# Patient Record
Sex: Female | Born: 1955
Health system: Southern US, Community
[De-identification: ages and names within clinical notes are randomized; demographics above are authoritative.]

## PROBLEM LIST (undated history)

## (undated) DIAGNOSIS — F329 Major depressive disorder, single episode, unspecified: Secondary | ICD-10-CM

## (undated) DIAGNOSIS — B019 Varicella without complication: Secondary | ICD-10-CM

## (undated) DIAGNOSIS — J301 Allergic rhinitis due to pollen: Secondary | ICD-10-CM

## (undated) DIAGNOSIS — E7800 Pure hypercholesterolemia, unspecified: Secondary | ICD-10-CM

## (undated) DIAGNOSIS — F32A Depression, unspecified: Secondary | ICD-10-CM

## (undated) DIAGNOSIS — M1990 Unspecified osteoarthritis, unspecified site: Secondary | ICD-10-CM

## (undated) DIAGNOSIS — T7840XA Allergy, unspecified, initial encounter: Secondary | ICD-10-CM

## (undated) DIAGNOSIS — M810 Age-related osteoporosis without current pathological fracture: Secondary | ICD-10-CM

## (undated) DIAGNOSIS — E039 Hypothyroidism, unspecified: Secondary | ICD-10-CM

## (undated) DIAGNOSIS — E079 Disorder of thyroid, unspecified: Secondary | ICD-10-CM

## (undated) HISTORY — PX: JOINT REPLACEMENT: SHX530

## (undated) HISTORY — PX: EYE SURGERY: SHX253

## (undated) HISTORY — DX: Allergic rhinitis due to pollen: J30.1

## (undated) HISTORY — DX: Allergy, unspecified, initial encounter: T78.40XA

## (undated) HISTORY — DX: Depression, unspecified: F32.A

## (undated) HISTORY — DX: Unspecified osteoarthritis, unspecified site: M19.90

## (undated) HISTORY — DX: Major depressive disorder, single episode, unspecified: F32.9

## (undated) HISTORY — PX: TOTAL KNEE ARTHROPLASTY: SHX125

## (undated) HISTORY — PX: PARTIAL KNEE ARTHROPLASTY: SHX2174

## (undated) HISTORY — DX: Varicella without complication: B01.9

## (undated) HISTORY — PX: ABDOMINAL HYSTERECTOMY: SHX81

---
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
## 2016-04-10 ENCOUNTER — Ambulatory Visit
Admission: EM | Admit: 2016-04-10 | Discharge: 2016-04-10 | Disposition: A | Discharge status: Home / Self Care | Payer: BLUE CROSS/BLUE SHIELD | Attending: Family Medicine | Admitting: Family Medicine

## 2016-04-10 ENCOUNTER — Ambulatory Visit (INDEPENDENT_AMBULATORY_CARE_PROVIDER_SITE_OTHER): Payer: BLUE CROSS/BLUE SHIELD

## 2016-04-10 DIAGNOSIS — S52131A Displaced fracture of neck of right radius, initial encounter for closed fracture: Secondary | ICD-10-CM

## 2016-04-10 HISTORY — DX: Disorder of thyroid, unspecified: E07.9

## 2016-04-10 HISTORY — DX: Pure hypercholesterolemia, unspecified: E78.00

## 2016-04-10 NOTE — ED Provider Notes (Signed)
CSN: [id number]     Arrival date & time 04/10/16  1320 History   First MD Initiated Contact with Patient 04/10/16 1352     Chief Complaint  Patient presents with  . Arm Injury    Pt reports two falls onto right side over the past month. Pain from upper right arm all the way down. "burning" pain 5/10. Full ROM but some movements make it hurt worse   (Consider location/radiation/quality/duration/timing/severity/associated sxs/prior Treatment) HPI   This is a 60 year old female who presents right nondominant shoulder and elbow pain following 2 separate falls. States that her first fall was about 3 weeks ago when she slipped on a throw rug on a hardwood floor fell landing onto her right side. States that time she had pain in her shoulder and elbow area which extended into her forearm or wrist fingers and hip were uninvolved. After a period of 2 weeks she had another fall on wet pavement given landing then the same fashion as the first fall again injuring her shoulder and elbow. She denies any loss of conscious and did not hit her head. However she continues to have pain in her shoulder with abduction and over her lateral elbow which seems to radiate over the dorsum of her forearm. Noticed that any activity with her wrist in extension in a pushing and is very painful. Her elbow is more painful than the shoulder. She did not hurt her neck and demonstrates good comfortable range of motion with some discomfort at the extremes but she states that she has had neck pain for many years. She denies any numbness or tingling but has had some radiation of the pain into her middle ring and little fingers.  Past Medical History  Diagnosis Date  . Thyroid dysfunction   . High cholesterol    Past Surgical History  Procedure Laterality Date  . Knee surgery     History reviewed. No pertinent family history. Social History  Substance Use Topics  . Smoking status: Never Smoker   . Smokeless tobacco: None  .  Alcohol Use: No   OB History    No data available     Review of Systems  Constitutional: Positive for activity change. Negative for fever, chills and fatigue.  Musculoskeletal: Positive for joint swelling and arthralgias.  All other systems reviewed and are negative.   Allergies  Review of patient's allergies indicates no known allergies.  Home Medications   Prior to Admission medications   Medication Sig Start Date End Date Taking? Authorizing Provider  atorvastatin (LIPITOR) 20 MG tablet Take 20 mg by mouth daily.   Yes Historical Provider, MD  thyroid (ARMOUR) 30 MG tablet Take 30 mg by mouth daily before breakfast.   Yes Historical Provider, MD   Meds Ordered and Administered this Visit  Medications - No data to display  BP 164/82 mmHg  Pulse 56  Temp(Src) 98 F (36.7 C) (Oral)  Resp 18  Ht 5\' 5"  (1.651 m)  Wt 184 lb (83.462 kg)  BMI 30.62 kg/m2  SpO2 98% No data found.   Physical Exam  Constitutional: She is oriented to person, place, and time. She appears well-developed and well-nourished. No distress.  HENT:  Head: Normocephalic and atraumatic.  Eyes: Conjunctivae are normal. Pupils are equal, round, and reactive to light.  Neck: Normal range of motion. Neck supple.  Musculoskeletal: She exhibits edema and tenderness.  Examination of the neck shows good comfortable range of motion is full with discomfort at the extremes.  This is not unusual for her. Examination of the clavicle shows no deformity and no discomfort. As noted the right shoulder shows some tenderness subacromial slightly posterior of midline. She has a negative empty can test negative arm raise and lowering test and Neer test. Strength is intact to clinical testing. Examination of the right elbow shows some mild swelling lateral over the lateral condyle. She does have tenderness over the common extensor origin reproducing her symptoms. Resisted wrist extension also causes pain as does lifting with the  hand in pronation. This causes a pain over the dorsum forearm over the common extensor tendons.  Neurological: She is alert and oriented to person, place, and time.  Skin: Skin is warm and dry. She is not diaphoretic.  Psychiatric: She has a normal mood and affect. Her behavior is normal. Judgment and thought content normal.  Nursing note and vitals reviewed.   ED Course  Procedures (including critical care time)  Labs Review Labs Reviewed - No data to display  Imaging Review Dg Shoulder Right  04/10/2016  CLINICAL DATA:  [place] twice on right arm in the last month. EXAM: RIGHT SHOULDER - 2+ VIEW COMPARISON:  None. FINDINGS: Mild degenerative changes in the right AC and glenohumeral joints with joint space narrowing and spurring. No acute bony abnormality. Specifically, no fracture, subluxation, or dislocation. Soft tissues are intact. IMPRESSION: No acute bony abnormality. Electronically Signed   By: Rolm Baptise M.D.   On: 04/10/2016 15:15   Dg Elbow Complete Right  04/10/2016  CLINICAL DATA:  Multiple falls in the last month onto right arm. Right elbow pain. EXAM: RIGHT ELBOW - COMPLETE 3+ VIEW COMPARISON:  None. FINDINGS: There is a mildly impacted radial neck fracture. No subluxation or dislocation. Soft tissues are intact. IMPRESSION: Mildly impacted right radial neck fracture. Electronically Signed   By: Rolm Baptise M.D.   On: 04/10/2016 15:16     Visual Acuity Review  Right Eye Distance:   Left Eye Distance:   Bilateral Distance:    Right Eye Near:   Left Eye Near:    Bilateral Near:     Application of reverse sugar tong splint to right upper extremity. Sling was provided for patient    MDM   1. Fracture of radial neck, right, closed, initial encounter    New Prescriptions   No medications on file  Plan: 1. Test/x-ray results and diagnosis reviewed with patient 2. rx as per orders; risks, benefits, potential side effects reviewed with patient 3. Recommend supportive  treatment with Elevation to control pain and swelling. Was given information on care for the splint. Sling was provided to the patient along with a reverse sugar tong splint to keep on until seen by the orthopedist next week. Encouraged her to perform range of motion with her fingers as well as her shoulder. 4. F/u prn if symptoms worsen or don't improve     Lorin Picket, PA-C 04/10/16 1543

## 2016-04-10 NOTE — Discharge Instructions (Signed)
°Cast or Splint Care  ° ° °Casts and splints support injured limbs and keep bones from moving while they heal. It is important to care for your cast or splint at home.  °HOME CARE INSTRUCTIONS  °Keep the cast or splint uncovered during the drying period. It can take 24 to 48 hours to dry if it is made of plaster. A fiberglass cast will dry in less than 1 hour.  °Do not rest the cast on anything harder than a pillow for the first 24 hours.  °Do not put weight on your injured limb or apply pressure to the cast until your health care provider gives you permission.  °Keep the cast or splint dry. Wet casts or splints can lose their shape and may not support the limb as well. A wet cast that has lost its shape can also create harmful pressure on your skin when it dries. Also, wet skin can become infected.  °Cover the cast or splint with a plastic bag when bathing or when out in the rain or snow. If the cast is on the trunk of the body, take sponge baths until the cast is removed.  °If your cast does become wet, dry it with a towel or a blow dryer on the cool setting only. °Keep your cast or splint clean. Soiled casts may be wiped with a moistened cloth.  °Do not place any hard or soft foreign objects under your cast or splint, such as cotton, toilet paper, lotion, or powder.  °Do not try to scratch the skin under the cast with any object. The object could get stuck inside the cast. Also, scratching could lead to an infection. If itching is a problem, use a blow dryer on a cool setting to relieve discomfort.  °Do not trim or cut your cast or remove padding from inside of it.  °Exercise all joints next to the injury that are not immobilized by the cast or splint. For example, if you have a long leg cast, exercise the hip joint and toes. If you have an arm cast or splint, exercise the shoulder, elbow, thumb, and fingers.  °Elevate your injured arm or leg on 1 or 2 pillows for the first 1 to 3 days to decrease swelling and  pain. It is best if you can comfortably elevate your cast so it is higher than your heart. °SEEK MEDICAL CARE IF:  °Your cast or splint cracks.  °Your cast or splint is too tight or too loose.  °You have unbearable itching inside the cast.  °Your cast becomes wet or develops a soft spot or area.  °You have a bad smell coming from inside your cast.  °You get an object stuck under your cast.  °Your skin around the cast becomes red or raw.  °You have new pain or worsening pain after the cast has been applied. °SEEK IMMEDIATE MEDICAL CARE IF:  °You have fluid leaking through the cast.  °You are unable to move your fingers or toes.  °You have discolored (blue or white), cool, painful, or very swollen fingers or toes beyond the cast.  °You have tingling or numbness around the injured area.  °You have severe pain or pressure under the cast.  °You have any difficulty with your breathing or have shortness of breath.  °You have chest pain. °This information is not intended to replace advice given to you by your health care provider. Make sure you discuss any questions you have with your health care provider.  °  Document Released: 10/23/2000 Document Revised: 08/16/2013 Document Reviewed: 05/04/2013  °Elsevier Interactive Patient Education ©2016 Elsevier Inc[employer]  ° °

## 2016-04-14 DIAGNOSIS — S52123A Displaced fracture of head of unspecified radius, initial encounter for closed fracture: Secondary | ICD-10-CM | POA: Insufficient documentation

## 2016-04-14 HISTORY — DX: Displaced fracture of head of unspecified radius, initial encounter for closed fracture: S52.123A

## 2016-04-17 ENCOUNTER — Encounter: Payer: Self-pay | Admitting: Primary Care

## 2016-04-17 ENCOUNTER — Ambulatory Visit (INDEPENDENT_AMBULATORY_CARE_PROVIDER_SITE_OTHER): Payer: BLUE CROSS/BLUE SHIELD | Admitting: Primary Care

## 2016-04-17 VITALS — BP 128/80 | HR 76 | Temp 98.0°F | Ht 64.0 in | Wt 190.1 lb

## 2016-04-17 DIAGNOSIS — D509 Iron deficiency anemia, unspecified: Secondary | ICD-10-CM | POA: Diagnosis not present

## 2016-04-17 DIAGNOSIS — M1990 Unspecified osteoarthritis, unspecified site: Secondary | ICD-10-CM | POA: Diagnosis not present

## 2016-04-17 DIAGNOSIS — E785 Hyperlipidemia, unspecified: Secondary | ICD-10-CM | POA: Insufficient documentation

## 2016-04-17 DIAGNOSIS — E039 Hypothyroidism, unspecified: Secondary | ICD-10-CM | POA: Diagnosis not present

## 2016-04-17 LAB — CBC
HCT: 43.6 % (ref 36.0–46.0)
Hemoglobin: 14.7 g/dL (ref 12.0–15.0)
MCHC: 33.7 g/dL (ref 30.0–36.0)
MCV: 86 fl (ref 78.0–100.0)
Platelets: 313 10*3/uL (ref 150.0–400.0)
RBC: 5.07 Mil/uL (ref 3.87–5.11)
RDW: 13.7 % (ref 11.5–15.5)
WBC: 7.8 10*3/uL (ref 4.0–10.5)

## 2016-04-17 LAB — IBC PANEL
IRON: 111 ug/dL (ref 42–145)
SATURATION RATIOS: 28.9 % (ref 20.0–50.0)
TRANSFERRIN: 274 mg/dL (ref 212.0–360.0)

## 2016-04-17 LAB — TSH: TSH: 1.32 u[IU]/mL (ref 0.35–4.50)

## 2016-04-17 MED ORDER — SIMVASTATIN 40 MG PO TABS: ORAL_TABLET | ORAL | Status: DC

## 2016-04-17 NOTE — Assessment & Plan Note (Signed)
Managed on simvastatin 40 mg, takes one half tablet daily. We'll repeat lipids at upcoming physical this fall

## 2016-04-17 NOTE — Assessment & Plan Note (Signed)
Diagnosed in 2011. Currently managed on thyroid Armour 60 mg every morning. TSH pending as she has not had repeat TSH since dose increase over several months ago.

## 2016-04-17 NOTE — Assessment & Plan Note (Signed)
Recheck IBC panel and CBC today. Continue iron supplements.

## 2016-04-17 NOTE — Patient Instructions (Signed)
Complete lab work prior to leaving today. I will notify you of your results once received.   Please schedule a physical with me in November/December 2017. You may also schedule a lab only appointment 3-4 days prior. We will discuss your lab results in detail during your physical.  It was a pleasure to meet you today! Please don't hesitate to call me with any questions. Welcome to [facility]!

## 2016-04-17 NOTE — Assessment & Plan Note (Signed)
Located to knees, fingers, toes. Manages with Tylenol as needed.

## 2016-04-17 NOTE — Progress Notes (Signed)
Subjective:    Patient ID: Jamie Koch, female    DOB: 1956/01/18, 59 y.o.   MRN: [id number]  HPI  Jamie Koch is a 60 year old female who presents today to establish care and discuss the problems mentioned below. Will obtain old records. Her last physical was in November/December 2016.  1) Hypothyroidism: Diagnosed in 2011. Currently managed on thyroid (armour) 30 mg. She takes 60 mg by mouth once daily. Her dose was increased about 6 months ago. Her last TSH was in 2016. She has not had a repeat TSH since the increase in her medication.  2) Hyperlipidemia: Diagnosed several years ago. Currently managed on simvastatin 40 mg, but takes one half tablet daily. She was once seeing a cardiologist as she had an abnormal ECG. She underwent cardiac echo and stress test which was negative.  3) Iron Deficiency Anemia: Struggled most of her life. Currently managed on iron polysaccharides 150 mg. She has never followed with a hematologist, but this was recommended as she was told that she wasn't absorbing the iron. She was donating blood monthly at that time. Her last blood donation was in November 2016. Denies fatigue, weakness.  4) Osteoarthritis: Currently managed on Tylenol. Right knee replacement in 2011. Also located to fingers and toes. She was once following with a rheumatologist. She is unsure if she was ever diagnosed with rheumatoid arthritis.  5) Fractured Olecranon: Status post fall several weeks ago. Currently following with [facility]opedics at [facility]. Did not require surgery.   Review of Systems  Constitutional: Negative for fatigue.  Respiratory: Negative for shortness of breath.   Cardiovascular: Negative for chest pain.  Endocrine: Negative for cold intolerance.  Musculoskeletal: Positive for arthralgias.  Neurological: Negative for weakness.       Past Medical History  Diagnosis Date  . Thyroid dysfunction   . High cholesterol   . Arthritis   . Depression   .  Chicken pox   . Hay fever      Social History   Social History  . Marital Status: Married    Spouse Name: N/A  . Number of Children: N/A  . Years of Education: N/A   Occupational History  . Not on file.   Social History Main Topics  . Smoking status: Never Smoker   . Smokeless tobacco: Never Used  . Alcohol Use: No  . Drug Use: No  . Sexual Activity: Not on file   Other Topics Concern  . Not on file   Social History Narrative   Married.   1 child.   Retired. Worked in Insurance claims handler.    Enjoys reading, gardening.     Past Surgical History  Procedure Laterality Date  . Tonsillectomy and adenoidectomy  1964  . Total knee arthroplasty Right     History reviewed. No pertinent family history.  No Known Allergies  Current Outpatient Prescriptions on File Prior to Visit  Medication Sig Dispense Refill  . thyroid (ARMOUR) 30 MG tablet Take 30 mg by mouth 2 (two) times daily. 2 tablets twice daily     No current facility-administered medications on file prior to visit.    BP 128/80 mmHg  Pulse 76  Temp(Src) 98 F (36.7 C)  Ht 5\' 4"  (1.626 m)  Wt 190 lb 1.9 oz (86.238 kg)  BMI 32.62 kg/m2  SpO2 96%    Objective:   Physical Exam  Constitutional: She appears well-nourished.  Neck: Neck supple.  Cardiovascular: Normal rate and regular rhythm.   Pulmonary/Chest:  Effort normal and breath sounds normal.  Skin: Skin is warm and dry.  Psychiatric: She has a normal mood and affect.          Assessment & Plan:

## 2016-08-03 ENCOUNTER — Encounter: Payer: Self-pay | Admitting: Primary Care

## 2016-08-03 ENCOUNTER — Ambulatory Visit (INDEPENDENT_AMBULATORY_CARE_PROVIDER_SITE_OTHER): Payer: BLUE CROSS/BLUE SHIELD | Admitting: Primary Care

## 2016-08-03 VITALS — BP 132/80 | HR 62 | Temp 98.3°F | Ht 64.0 in | Wt 187.8 lb

## 2016-08-03 DIAGNOSIS — E039 Hypothyroidism, unspecified: Secondary | ICD-10-CM | POA: Diagnosis not present

## 2016-08-03 MED ORDER — THYROID 30 MG PO TABS: 60.0000 mg | ORAL_TABLET | Freq: Two times a day (BID) | ORAL | 3 refills | Status: DC

## 2016-08-03 NOTE — Progress Notes (Signed)
Pre visit review using our clinic review tool, if applicable. No additional management support is needed unless otherwise documented below in the visit note. 

## 2016-08-03 NOTE — Patient Instructions (Addendum)
You have a stye to the left eye which is causing irritation and swelling.  Application of warm compresses four times daily for 10 minutes in duration will help to reduce the stye.  Please see your eye doctor later this week if no improvement.  Continue the tobramycin eye drops for 2 additional days, then stop.  I sent refills of your thyroid medication to your pharmacy. Take 2 tablets by mouth twice daily.  It was a pleasure to see you today!  Stye A stye is a bump on your eyelid caused by a bacterial infection. A stye can form inside the eyelid (internal stye) or outside the eyelid (external stye). An internal stye may be caused by an infected oil-producing gland inside your eyelid. An external stye may be caused by an infection at the base of your eyelash (hair follicle). Styes are very common. Anyone can get them at any age. They usually occur in just one eye, but you may have more than one in either eye.  CAUSES  The infection is almost always caused by bacteria called Staphylococcus aureus. This is a common type of bacteria that lives on your skin. RISK FACTORS You may be at higher risk for a stye if you have had one before. You may also be at higher risk if you have:  Diabetes.  Long-term illness.  Long-term eye redness.  A skin condition called seborrhea.  High fat levels in your blood (lipids). SIGNS AND SYMPTOMS  Eyelid pain is the most common symptom of a stye. Internal styes are more painful than external styes. Other signs and symptoms may include:  Painful swelling of your eyelid.  A scratchy feeling in your eye.  Tearing and redness of your eye.  Pus draining from the stye. DIAGNOSIS  Your health care provider may be able to diagnose a stye just by examining your eye. The health care provider may also check to make sure:  You do not have a fever or other signs of a more serious infection.  The infection has not spread to other parts of your eye or areas  around your eye. TREATMENT  Most styes will clear up in a few days without treatment. In some cases, you may need to use antibiotic drops or ointment to prevent infection. Your health care provider may have to drain the stye surgically if your stye is:  Large.  Causing a lot of pain.  Interfering with your vision. This can be done using a thin blade or a needle.  HOME CARE INSTRUCTIONS   Take medicines only as directed by your health care provider.  Apply a clean, warm compress to your eye for 10 minutes, 4 times a day.  Do not wear contact lenses or eye makeup until your stye has healed.  Do not try to pop or drain the stye. SEEK MEDICAL CARE IF:  You have chills or a fever.  Your stye does not go away after several days.  Your stye affects your vision.  Your eyeball becomes swollen, red, or painful. MAKE SURE YOU:  Understand these instructions.  Will watch your condition.  Will get help right away if you are not doing well or get worse.   This information is not intended to replace advice given to you by your health care provider. Make sure you discuss any questions you have with your health care provider.   Document Released: 08/05/2005 Document Revised: 11/16/2014 Document Reviewed: 02/09/2014 Elsevier Interactive Patient Education [employer].

## 2016-08-03 NOTE — Progress Notes (Signed)
Subjective:    Patient ID: Jamie Koch, female    DOB: 13-Nov-1955, 60 y.o.   MRN: [id number]  HPI  Jamie Koch is a 60 year old female who presents today with a chief complaint of eye swelling. She also reports bumps, drainage, and erythema. Her symptoms are located to the left eye which has been present since 09/19. She was evaluated at a [facility] on 07/30/16 and was prescribed Tobramycin eye drops without much improvement. Yesterday she noticed the presence of irritating red bumps to the bottom of her inner left eye. Overall she believes her symptoms of swelling and presence of bumps are worse. Denies fevers, chills, changes in vision.   Review of Systems  Constitutional: Negative for chills and fever.  HENT: Negative for congestion.   Eyes: Positive for redness and itching. Negative for discharge and visual disturbance.       Bump to bottom of left eyelid  Respiratory: Negative for cough.        Past Medical History:  Diagnosis Date  . Arthritis   . Chicken pox   . Depression   . Hay fever   . High cholesterol   . Thyroid dysfunction      Social History   Social History  . Marital status: Married    Spouse name: N/A  . Number of children: N/A  . Years of education: N/A   Occupational History  . Not on file.   Social History Main Topics  . Smoking status: Never Smoker  . Smokeless tobacco: Never Used  . Alcohol use No  . Drug use: No  . Sexual activity: Not on file   Other Topics Concern  . Not on file   Social History Narrative   Married.   1 child.   Retired. Worked in Insurance claims handler.    Enjoys reading, gardening.     Past Surgical History:  Procedure Laterality Date  . TONSILLECTOMY AND ADENOIDECTOMY  1964  . TOTAL KNEE ARTHROPLASTY Right     No family history on file.  No Known Allergies  Current Outpatient Prescriptions on File Prior to Visit  Medication Sig Dispense Refill  . acetaminophen (TYLENOL) 325 MG tablet Take 650  mg by mouth every 6 (six) hours as needed.    Marland Kitchen aspirin 81 MG tablet Take 81 mg by mouth daily.    . cholecalciferol (VITAMIN D) 1000 units tablet Take 5,000 Units by mouth daily.    . iron polysaccharides (NIFEREX) 150 MG capsule Take 150 mg by mouth daily.    . simvastatin (ZOCOR) 40 MG tablet Take 1/2 tablet by mouth daily. 45 tablet 2  . Thiamine HCl (VITAMIN B-1) 250 MG tablet Take 250 mg by mouth daily.     No current facility-administered medications on file prior to visit.     BP 132/80   Pulse 62   Temp 98.3 F (36.8 C) (Oral)   Ht 5\' 4"  (1.626 m)   Wt 187 lb 12.8 oz (85.2 kg)   SpO2 96%   BMI 32.24 kg/m    Objective:   Physical Exam  Constitutional: She appears well-nourished.  HENT:  Mouth/Throat: Oropharynx is clear and moist.  Eyes: Pupils are equal, round, and reactive to light. Right eye exhibits no discharge and no exudate. Left eye exhibits no discharge and no exudate. Right conjunctiva is not injected. Right conjunctiva has no hemorrhage. Left conjunctiva is not injected. Left conjunctiva has no hemorrhage.  Stye noted to inner portion of bottom  left eyelid. Mild erythema and swelling surrounding bottom left eyelid.  Neck: Neck supple.  Cardiovascular: Normal rate and regular rhythm.   Pulmonary/Chest: Effort normal and breath sounds normal.  Skin: Skin is warm and dry. There is erythema.          Assessment & Plan:  Stye:  Erythema, swelling, tenderness to left eye lid 1 week. Overall no improvement with tobramycin eyedrops. Obvious stye located to inner lower left eyelid. Denies changes in vision. Discussed treatment of stye including warm compresses. Continue additional 2 days of antibiotic eyedrops.  He will follow-up with her eye doctor later this week no improvement. At this point do not believe stye needs to be lanced.  Sheral Flow, NP

## 2016-10-10 ENCOUNTER — Other Ambulatory Visit: Payer: Self-pay | Admitting: Primary Care

## 2016-10-10 DIAGNOSIS — Z0000 Encounter for general adult medical examination without abnormal findings: Secondary | ICD-10-CM

## 2016-10-10 DIAGNOSIS — Z1159 Encounter for screening for other viral diseases: Secondary | ICD-10-CM

## 2016-10-10 DIAGNOSIS — E039 Hypothyroidism, unspecified: Secondary | ICD-10-CM

## 2016-10-10 DIAGNOSIS — E785 Hyperlipidemia, unspecified: Secondary | ICD-10-CM

## 2016-10-10 DIAGNOSIS — R7303 Prediabetes: Secondary | ICD-10-CM

## 2016-10-10 DIAGNOSIS — D509 Iron deficiency anemia, unspecified: Secondary | ICD-10-CM

## 2016-10-14 ENCOUNTER — Other Ambulatory Visit (INDEPENDENT_AMBULATORY_CARE_PROVIDER_SITE_OTHER): Payer: BLUE CROSS/BLUE SHIELD

## 2016-10-14 DIAGNOSIS — Z1159 Encounter for screening for other viral diseases: Secondary | ICD-10-CM

## 2016-10-14 DIAGNOSIS — D509 Iron deficiency anemia, unspecified: Secondary | ICD-10-CM | POA: Diagnosis not present

## 2016-10-14 DIAGNOSIS — Z0000 Encounter for general adult medical examination without abnormal findings: Secondary | ICD-10-CM | POA: Diagnosis not present

## 2016-10-14 DIAGNOSIS — R7303 Prediabetes: Secondary | ICD-10-CM | POA: Diagnosis not present

## 2016-10-14 DIAGNOSIS — E785 Hyperlipidemia, unspecified: Secondary | ICD-10-CM | POA: Diagnosis not present

## 2016-10-14 DIAGNOSIS — E039 Hypothyroidism, unspecified: Secondary | ICD-10-CM | POA: Diagnosis not present

## 2016-10-14 LAB — LIPID PANEL
CHOL/HDL RATIO: 5
CHOLESTEROL: 192 mg/dL (ref 0–200)
HDL: 38.1 mg/dL — AB (ref >39.00)
NonHDL: 154.32
Triglycerides: 205 mg/dL — ABNORMAL HIGH (ref 0.0–149.0)
VLDL: 41 mg/dL — AB (ref 0.0–40.0)

## 2016-10-14 LAB — COMPREHENSIVE METABOLIC PANEL
ALBUMIN: 4.4 g/dL (ref 3.5–5.2)
ALT: 16 U/L (ref 0–35)
AST: 14 U/L (ref 0–37)
Alkaline Phosphatase: 66 U/L (ref 39–117)
BUN: 12 mg/dL (ref 6–23)
CHLORIDE: 104 meq/L (ref 96–112)
CO2: 32 mEq/L (ref 19–32)
CREATININE: 0.86 mg/dL (ref 0.40–1.20)
Calcium: 9.7 mg/dL (ref 8.4–10.5)
GFR: 71.28 mL/min (ref >60.00)
Glucose, Bld: 112 mg/dL — ABNORMAL HIGH (ref 70–99)
POTASSIUM: 4.2 meq/L (ref 3.5–5.1)
SODIUM: 144 meq/L (ref 135–145)
Total Bilirubin: 0.6 mg/dL (ref 0.2–1.2)
Total Protein: 6.6 g/dL (ref 6.0–8.3)

## 2016-10-14 LAB — TSH: TSH: 6.37 u[IU]/mL — ABNORMAL HIGH (ref 0.35–4.50)

## 2016-10-14 LAB — CBC
HCT: 43.5 % (ref 36.0–46.0)
HEMOGLOBIN: 14.9 g/dL (ref 12.0–15.0)
MCHC: 34.3 g/dL (ref 30.0–36.0)
MCV: 89.1 fl (ref 78.0–100.0)
PLATELETS: 305 10*3/uL (ref 150.0–400.0)
RBC: 4.88 Mil/uL (ref 3.87–5.11)
RDW: 13.1 % (ref 11.5–15.5)
WBC: 7.9 10*3/uL (ref 4.0–10.5)

## 2016-10-14 LAB — IBC PANEL
Iron: 131 ug/dL (ref 42–145)
Saturation Ratios: 37 % (ref 20.0–50.0)
Transferrin: 253 mg/dL (ref 212.0–360.0)

## 2016-10-14 LAB — LDL CHOLESTEROL, DIRECT: Direct LDL: 123 mg/dL

## 2016-10-14 LAB — HEMOGLOBIN A1C: HEMOGLOBIN A1C: 5.6 % (ref 4.6–6.5)

## 2016-10-14 LAB — VITAMIN D 25 HYDROXY (VIT D DEFICIENCY, FRACTURES): VITD: 36.12 ng/mL (ref 30.00–100.00)

## 2016-10-15 LAB — HEPATITIS C ANTIBODY: HCV AB: NEGATIVE

## 2016-10-19 ENCOUNTER — Other Ambulatory Visit (HOSPITAL_COMMUNITY)
Admission: RE | Admit: 2016-10-19 | Discharge: 2016-10-19 | Disposition: A | Discharge status: Home / Self Care | Payer: BLUE CROSS/BLUE SHIELD | Source: Ambulatory Visit | Attending: Primary Care | Admitting: Primary Care

## 2016-10-19 ENCOUNTER — Encounter: Payer: Self-pay | Admitting: Primary Care

## 2016-10-19 ENCOUNTER — Ambulatory Visit (INDEPENDENT_AMBULATORY_CARE_PROVIDER_SITE_OTHER): Payer: BLUE CROSS/BLUE SHIELD | Admitting: Primary Care

## 2016-10-19 VITALS — BP 128/82 | HR 80 | Temp 98.7°F | Ht 64.5 in | Wt 190.3 lb

## 2016-10-19 DIAGNOSIS — M1990 Unspecified osteoarthritis, unspecified site: Secondary | ICD-10-CM | POA: Diagnosis not present

## 2016-10-19 DIAGNOSIS — Z1151 Encounter for screening for human papillomavirus (HPV): Secondary | ICD-10-CM | POA: Diagnosis present

## 2016-10-19 DIAGNOSIS — Z124 Encounter for screening for malignant neoplasm of cervix: Secondary | ICD-10-CM | POA: Diagnosis not present

## 2016-10-19 DIAGNOSIS — E782 Mixed hyperlipidemia: Secondary | ICD-10-CM

## 2016-10-19 DIAGNOSIS — Z23 Encounter for immunization: Secondary | ICD-10-CM | POA: Diagnosis not present

## 2016-10-19 DIAGNOSIS — Z01419 Encounter for gynecological examination (general) (routine) without abnormal findings: Secondary | ICD-10-CM | POA: Diagnosis present

## 2016-10-19 DIAGNOSIS — E039 Hypothyroidism, unspecified: Secondary | ICD-10-CM

## 2016-10-19 DIAGNOSIS — D509 Iron deficiency anemia, unspecified: Secondary | ICD-10-CM

## 2016-10-19 DIAGNOSIS — Z0000 Encounter for general adult medical examination without abnormal findings: Secondary | ICD-10-CM

## 2016-10-19 MED ORDER — ZOSTER VACCINE LIVE 19400 UNT/0.65ML ~~LOC~~ SUSR: 0.6500 mL | Freq: Once | SUBCUTANEOUS | 0 refills | Status: AC

## 2016-10-19 NOTE — Assessment & Plan Note (Signed)
Immunizations UTD. Rx for Zostavax printed for patient to fill at pharmacy. Mammogram UTD, due in 2018. Pap due, pending. Colonoscopy due, patient elects for cologuard. Discussed the importance of a healthy diet and regular exercise in order for weight loss, and to reduce the risk of other medical diseases. Exam unremarkable. Labs overall stable. Follow up in 1 year for annual exam.

## 2016-10-19 NOTE — Assessment & Plan Note (Signed)
CBC stable

## 2016-10-19 NOTE — Progress Notes (Signed)
Pre visit review using our clinic review tool, if applicable. No additional management support is needed unless otherwise documented below in the visit note. 

## 2016-10-19 NOTE — Progress Notes (Signed)
Subjective:    Patient ID: Jamie Koch, female    DOB: July 27, 1956, 60 y.o.   MRN: [id number]  HPI  Jamie Koch is a 60 year old female who presents today for complete physical.  Immunizations: -Tetanus: Completed in 2016 -Influenza: Completed in September 2017 -Pneumonia: Completed Pneumovax in 2011 -Shingles: Never completed.   Diet: She endorses a poor diet. Breakfast: Cereal Lunch: Sandwich, hamburger Dinner: Chicken, fish, pasta, vegetables Snacks: Chips Desserts: Occasionally Beverages: Coffee, sprite zero, water (1 large bottle)  Exercise: She does not currently exercise. Eye exam: Completed in March 2016, no changes in vision. Cataract surgery. Dental exam: Completed 6 months ago. Colonoscopy: Never completed. Interested in [employer]. Pap Smear: Completed years ago, due today. Mammogram: Completed in 2016. Due in 2018.    Review of Systems  Constitutional: Negative for fatigue and unexpected weight change.  HENT: Negative for rhinorrhea.   Respiratory: Negative for cough and shortness of breath.   Cardiovascular: Negative for chest pain.  Gastrointestinal: Negative for constipation and diarrhea.  Endocrine: Negative for cold intolerance.  Genitourinary: Negative for difficulty urinating and menstrual problem.  Musculoskeletal: Positive for arthralgias. Negative for myalgias.  Skin: Negative for rash.  Allergic/Immunologic: Negative for environmental allergies.  Neurological: Negative for dizziness, numbness and headaches.  Psychiatric/Behavioral:       She denies concerns for anxiety and depression       Past Medical History:  Diagnosis Date  . Arthritis   . Chicken pox   . Depression   . Hay fever   . High cholesterol   . Thyroid dysfunction      Social History   Social History  . Marital status: Married    Spouse name: N/A  . Number of children: N/A  . Years of education: N/A   Occupational History  . Not on file.   Social History  Main Topics  . Smoking status: Never Smoker  . Smokeless tobacco: Never Used  . Alcohol use No  . Drug use: No  . Sexual activity: Not on file   Other Topics Concern  . Not on file   Social History Narrative   Married.   1 child.   Retired. Worked in Insurance claims handler.    Enjoys reading, gardening.     Past Surgical History:  Procedure Laterality Date  . TONSILLECTOMY AND ADENOIDECTOMY  1964  . TOTAL KNEE ARTHROPLASTY Right     No family history on file.  No Known Allergies  Current Outpatient Prescriptions on File Prior to Visit  Medication Sig Dispense Refill  . acetaminophen (TYLENOL) 325 MG tablet Take 650 mg by mouth every 6 (six) hours as needed.    Marland Kitchen aspirin 81 MG tablet Take 81 mg by mouth daily.    . cholecalciferol (VITAMIN D) 1000 units tablet Take 5,000 Units by mouth daily.    . iron polysaccharides (NIFEREX) 150 MG capsule Take 150 mg by mouth daily.    . simvastatin (ZOCOR) 40 MG tablet Take 1/2 tablet by mouth daily. 45 tablet 2  . Thiamine HCl (VITAMIN B-1) 250 MG tablet Take 250 mg by mouth daily.    Marland Kitchen thyroid (ARMOUR) 30 MG tablet Take 2 tablets (60 mg total) by mouth 2 (two) times daily. 360 tablet 3   No current facility-administered medications on file prior to visit.     BP 128/82   Pulse 80   Temp 98.7 F (37.1 C) (Oral)   Ht 5' 4.5" (1.638 m)   Wt 190 lb 5  oz (86.3 kg)   BMI 32.16 kg/m    Objective:   Physical Exam  Constitutional: She is oriented to person, place, and time. She appears well-nourished.  HENT:  Right Ear: Tympanic membrane and ear canal normal.  Left Ear: Tympanic membrane and ear canal normal.  Nose: Nose normal.  Mouth/Throat: Oropharynx is clear and moist.  Eyes: Conjunctivae and EOM are normal. Pupils are equal, round, and reactive to light.  Neck: Neck supple. No thyromegaly present.  Cardiovascular: Normal rate and regular rhythm.   No murmur heard. Pulmonary/Chest: Effort normal and breath sounds normal.  She has no rales.  Abdominal: Soft. Bowel sounds are normal. There is no tenderness.  Musculoskeletal: Normal range of motion.  Lymphadenopathy:    She has no cervical adenopathy.  Neurological: She is alert and oriented to person, place, and time. She has normal reflexes. No cranial nerve deficit.  Skin: Skin is warm and dry. No rash noted.  Psychiatric: She has a normal mood and affect.          Assessment & Plan:

## 2016-10-19 NOTE — Assessment & Plan Note (Signed)
Overall stable with exception of triglycerides. Discussed low fat diet, start exercising. Information provided regarding lowering triglycerides.

## 2016-10-19 NOTE — Patient Instructions (Addendum)
We will notify you of your pap results once received.  Send in the specimen for Cologuard. I will notify you of the results once received.  Take the Shingles prescription to your pharmacy for administration.   Your triglycerides are too high. Work on a low fat diet and take a look at the information below.  It's importance to improve your diet by reducing consumption of fast food, fried food, processed snack foods, sugary drinks. Increase consumption of fresh vegetables and fruits, whole grains, water.  Ensure you are drinking 64 ounces of water daily.  Start exercising. You should be getting 150 minutes of moderate intensity exercise weekly.  Take your thyroid medication twice daily as prescribed as your thyroid function was abnormal.   Continue Vitamin D capsules.  Follow up in 1 year for an annual physical or sooner if needed.  It was a pleasure to see you today!  Food Choices to Lower Your Triglycerides Triglycerides are a type of fat in your blood. High levels of triglycerides can increase the risk of heart disease and stroke. If your triglyceride levels are high, the foods you eat and your eating habits are very important. Choosing the right foods can help lower your triglycerides. What general guidelines do I need to follow?  Lose weight if you are overweight.  Limit or avoid alcohol.  Fill one half of your plate with vegetables and green salads.  Limit fruit to two servings a day. Choose fruit instead of juice.  Make one fourth of your plate whole grains. Look for the word "whole" as the first word in the ingredient list.  Fill one fourth of your plate with lean protein foods.  Enjoy fatty fish (such as salmon, mackerel, sardines, and tuna) three times a week.  Choose healthy fats.  Limit foods high in starch and sugar.  Eat more home-cooked food and less restaurant, buffet, and fast food.  Limit fried foods.  Cook foods using methods other than  frying.  Limit saturated fats.  Check ingredient lists to avoid foods with partially hydrogenated oils (trans fats) in them. What foods can I eat? Grains  Whole grains, such as whole wheat or whole grain breads, crackers, cereals, and pasta. Unsweetened oatmeal, bulgur, barley, quinoa, or brown rice. Corn or whole wheat flour tortillas. Vegetables  Fresh or frozen vegetables (raw, steamed, roasted, or grilled). Green salads. Fruits  All fresh, canned (in natural juice), or frozen fruits. Meat and Other Protein Products  Ground beef (85% or leaner), grass-fed beef, or beef trimmed of fat. Skinless chicken or [place]. Ground chicken or [place]. Pork trimmed of fat. All fish and seafood. Eggs. Dried beans, peas, or lentils. Unsalted nuts or seeds. Unsalted canned or dry beans. Dairy  Low-fat dairy products, such as skim or 1% milk, 2% or reduced-fat cheeses, low-fat ricotta or cottage cheese, or plain low-fat yogurt. Fats and Oils  Tub margarines without trans fats. Light or reduced-fat mayonnaise and salad dressings. Avocado. Safflower, olive, or canola oils. Natural peanut or almond butter. The items listed above may not be a complete list of recommended foods or beverages. Contact your dietitian for more options.  What foods are not recommended? Grains  White bread. White pasta. White rice. Cornbread. Bagels, pastries, and croissants. Crackers that contain trans fat. Vegetables  White potatoes. Corn. Creamed or fried vegetables. Vegetables in a cheese sauce. Fruits  Dried fruits. Canned fruit in light or heavy syrup. Fruit juice. Meat and Other Protein Products  Fatty cuts of meat.  Ribs, chicken wings, bacon, sausage, bologna, salami, chitterlings, fatback, hot dogs, bratwurst, and packaged luncheon meats. Dairy  Whole or 2% milk, cream, half-and-half, and cream cheese. Whole-fat or sweetened yogurt. Full-fat cheeses. Nondairy creamers and whipped toppings. Processed cheese, cheese  spreads, or cheese curds. Sweets and Desserts  Corn syrup, sugars, honey, and molasses. Candy. Jam and jelly. Syrup. Sweetened cereals. Cookies, pies, cakes, donuts, muffins, and ice cream. Fats and Oils  Butter, stick margarine, lard, shortening, ghee, or bacon fat. Coconut, palm kernel, or palm oils. Beverages  Alcohol. Sweetened drinks (such as sodas, lemonade, and fruit drinks or punches). The items listed above may not be a complete list of foods and beverages to avoid. Contact your dietitian for more information.  This information is not intended to replace advice given to you by your health care provider. Make sure you discuss any questions you have with your health care provider. Document Released: 08/13/2004 Document Revised: 04/02/2016 Document Reviewed: 08/30/2013 Elsevier Interactive Patient Education  2017 [employer].

## 2016-10-19 NOTE — Assessment & Plan Note (Signed)
Overall stable.   

## 2016-10-19 NOTE — Assessment & Plan Note (Signed)
TSH elevated at 6. Has not been taking medications as prescribed. Discussed importance of compliance. Will recheck TSH in 4 weeks.

## 2016-10-21 LAB — CYTOLOGY - PAP
Diagnosis: NEGATIVE
HPV: NOT DETECTED

## 2016-10-22 ENCOUNTER — Encounter: Payer: Self-pay | Admitting: Primary Care

## 2016-11-19 ENCOUNTER — Other Ambulatory Visit: Payer: BLUE CROSS/BLUE SHIELD

## 2016-11-23 ENCOUNTER — Encounter: Payer: Self-pay | Admitting: Primary Care

## 2016-11-25 ENCOUNTER — Encounter: Payer: Self-pay | Admitting: Primary Care

## 2016-11-26 ENCOUNTER — Other Ambulatory Visit: Payer: BLUE CROSS/BLUE SHIELD

## 2016-11-27 ENCOUNTER — Telehealth: Payer: Self-pay | Admitting: Primary Care

## 2016-11-27 LAB — COLOGUARD: Cologuard: NEGATIVE

## 2016-11-27 NOTE — Telephone Encounter (Signed)
Sending letter with results and Kate's comments for patient. 

## 2016-11-27 NOTE — Telephone Encounter (Signed)
Please notify patient that I received the results of her Cologuard test which were negative. This means she has a very low likelihood for colon cancer. We will repeat this in 3 years

## 2017-03-18 ENCOUNTER — Encounter: Payer: Self-pay | Admitting: Primary Care

## 2017-03-18 ENCOUNTER — Ambulatory Visit (INDEPENDENT_AMBULATORY_CARE_PROVIDER_SITE_OTHER): Payer: BLUE CROSS/BLUE SHIELD | Admitting: Primary Care

## 2017-03-18 VITALS — BP 118/70 | HR 90 | Temp 98.9°F | Wt 194.8 lb

## 2017-03-18 DIAGNOSIS — M62838 Other muscle spasm: Secondary | ICD-10-CM

## 2017-03-18 DIAGNOSIS — M5410 Radiculopathy, site unspecified: Secondary | ICD-10-CM | POA: Diagnosis not present

## 2017-03-18 MED ORDER — CYCLOBENZAPRINE HCL 5 MG PO TABS: 5.0000 mg | ORAL_TABLET | Freq: Three times a day (TID) | ORAL | 0 refills | Status: DC | PRN

## 2017-03-18 MED ORDER — PREDNISONE 10 MG PO TABS: ORAL_TABLET | ORAL | 0 refills | Status: DC

## 2017-03-18 NOTE — Patient Instructions (Signed)
Start prednisone tablets. Take three tablets for 2 days, then two tablets for 2 days, then one tablet for 2 days.  You may take cyclobenzaprine 5 mg tablets every 8 hours as needed for muscle spasm. Caution as this may cause drowsiness.  It was a pleasure to see you today!

## 2017-03-18 NOTE — Progress Notes (Signed)
Subjective:    Patient ID: Jamie Koch, female    DOB: July 03, 1956, 61 y.o.   MRN: [id number]  HPI  Ms. Recktenwald is a 61 year old female with a history of osteoarthritis who presents today with a chief complaint of right posterior shoulder pain. She also reports tingling to the right upper extremity with radiation of pain to her elbow. She has a history of mildly impacted right radial neck fracture from after fall in 04/2016. She was following with [facility] at that time. Her symptoms began three weeks ago after sleeping several days in the hospital on a fold out chair.   Her pain and tingling are worse, now she's experienced pain to her elbow. The tinging is intermittent, mostly when laying down. She's taken tylenol with some improvement in pain, not in tingling. She denies recent trauma/injury, weakness.  Review of Systems  Musculoskeletal: Positive for arthralgias.  Neurological: Positive for numbness. Negative for weakness.       Past Medical History:  Diagnosis Date  . Arthritis   . Chicken pox   . Depression   . Hay fever   . High cholesterol   . Thyroid dysfunction      Social History   Social History  . Marital status: Married    Spouse name: N/A  . Number of children: N/A  . Years of education: N/A   Occupational History  . Not on file.   Social History Main Topics  . Smoking status: Never Smoker  . Smokeless tobacco: Never Used  . Alcohol use No  . Drug use: No  . Sexual activity: Not on file   Other Topics Concern  . Not on file   Social History Narrative   Married.   1 child.   Retired. Worked in Insurance claims handler.    Enjoys reading, gardening.     Past Surgical History:  Procedure Laterality Date  . TONSILLECTOMY AND ADENOIDECTOMY  1964  . TOTAL KNEE ARTHROPLASTY Right     No family history on file.  No Known Allergies  Current Outpatient Prescriptions on File Prior to Visit  Medication Sig Dispense Refill  . acetaminophen  (TYLENOL) 325 MG tablet Take 650 mg by mouth every 6 (six) hours as needed.    Marland Kitchen aspirin 81 MG tablet Take 81 mg by mouth daily.    . cholecalciferol (VITAMIN D) 1000 units tablet Take 5,000 Units by mouth daily.    . iron polysaccharides (NIFEREX) 150 MG capsule Take 150 mg by mouth daily.    . simvastatin (ZOCOR) 40 MG tablet Take 1/2 tablet by mouth daily. 45 tablet 2  . Thiamine HCl (VITAMIN B-1) 250 MG tablet Take 250 mg by mouth daily.    Marland Kitchen thyroid (ARMOUR) 30 MG tablet Take 2 tablets (60 mg total) by mouth 2 (two) times daily. 360 tablet 3   No current facility-administered medications on file prior to visit.     BP 118/70   Pulse 90   Temp 98.9 F (37.2 C) (Oral)   Wt 194 lb 12 oz (88.3 kg)   SpO2 97%   BMI 32.91 kg/m    Objective:   Physical Exam  Constitutional: She appears well-nourished.  Neck: Neck supple.  Cardiovascular: Normal rate.   Pulmonary/Chest: Effort normal.  Musculoskeletal:       Right shoulder: She exhibits pain. She exhibits normal range of motion and no tenderness.       Arms: Pain to right posterior shoulder over trapezius.  Assessment & Plan:  Muscle Spasm with radiculopathy:  Posterior right shoulder pain x 3 weeks, now with radiculopathy to right upper extremity with numbness/tingling.  Exam today with good ROM to shoulder and upper back. Tenderness as noted above.  Suspect muscle spasm that has caused nerve irritation. Since no improvement with tylenol will trial low dose prednisone taper, Flexeril PRN. Heating pad PRN.  Sheral Flow, NP

## 2017-03-18 NOTE — Progress Notes (Signed)
Pre visit review using our clinic review tool, if applicable. No additional management support is needed unless otherwise documented below in the visit note. 

## 2017-04-02 ENCOUNTER — Other Ambulatory Visit: Payer: Self-pay | Admitting: Primary Care

## 2017-04-02 DIAGNOSIS — E785 Hyperlipidemia, unspecified: Secondary | ICD-10-CM

## 2017-05-07 ENCOUNTER — Encounter: Payer: Self-pay | Admitting: Primary Care

## 2017-05-07 NOTE — Telephone Encounter (Signed)
Spoken to patient and notified her that she is not due the second Pneumococcal until 61 years of age.

## 2017-05-20 ENCOUNTER — Encounter: Payer: Self-pay | Admitting: Family Medicine

## 2017-05-20 ENCOUNTER — Ambulatory Visit (INDEPENDENT_AMBULATORY_CARE_PROVIDER_SITE_OTHER): Payer: BLUE CROSS/BLUE SHIELD | Admitting: Family Medicine

## 2017-05-20 VITALS — BP 118/84 | HR 78 | Temp 98.3°F | Ht 64.5 in | Wt 195.0 lb

## 2017-05-20 DIAGNOSIS — J208 Acute bronchitis due to other specified organisms: Secondary | ICD-10-CM

## 2017-05-20 MED ORDER — GUAIFENESIN-CODEINE 100-10 MG/5ML PO SYRP: 5.0000 mL | ORAL_SOLUTION | Freq: Two times a day (BID) | ORAL | 0 refills | Status: DC | PRN

## 2017-05-20 NOTE — Progress Notes (Signed)
BP 118/84   Pulse 78   Temp 98.3 F (36.8 C)   Ht 5' 4.5" (1.638 m)   Wt 195 lb (88.5 kg)   SpO2 95%   BMI 32.95 kg/m    CC: cough, back pain, headache Subjective:    Patient ID: Jamie Koch, female    DOB: 02/24/1956, 61 y.o.   MRN: [id number]  HPI: Jamie Koch is a 61 y.o. female presenting on 05/20/2017 for Acute Visit (cough  with upper back pain & headache)   Several day history of bad cough that is now causing headache and neck pain from coughing. This initially started with sore throat. Some choking with cough. Overall dry cough. Some wheezing at night time. + PNDrainage. Some congestion and rhinorrhea.   No fevers/chills, ear or tooth pain, dyspnea.  She has been treating with tylenol, robitussin DM, and antihistamines.   No h/o asthma.  Sister sick with PNA recently, was hospitalized for this. Sister is now home.  She does have allergies.  Non smoker.   Relevant past medical, surgical, family and social history reviewed and updated as indicated. Interim medical history since our last visit reviewed. Allergies and medications reviewed and updated. Outpatient Medications Prior to Visit  Medication Sig Dispense Refill  . acetaminophen (TYLENOL) 325 MG tablet Take 650 mg by mouth every 6 (six) hours as needed.    Marland Kitchen aspirin 81 MG tablet Take 81 mg by mouth daily.    . cholecalciferol (VITAMIN D) 1000 units tablet Take 5,000 Units by mouth daily.    . cyclobenzaprine (FLEXERIL) 5 MG tablet Take 1 tablet (5 mg total) by mouth 3 (three) times daily as needed for muscle spasms. 15 tablet 0  . iron polysaccharides (NIFEREX) 150 MG capsule Take 150 mg by mouth daily.    . simvastatin (ZOCOR) 40 MG tablet TAKE 1/2 TABLET BY MOUTH DAILY 45 tablet 0  . Thiamine HCl (VITAMIN B-1) 250 MG tablet Take 250 mg by mouth daily.    Marland Kitchen thyroid (ARMOUR) 30 MG tablet Take 2 tablets (60 mg total) by mouth 2 (two) times daily. 360 tablet 3  . predniSONE (DELTASONE) 10 MG tablet Take three  tablets for 2 days, then two tablets for 2 days, then one tablet for 2 days. 12 tablet 0   No facility-administered medications prior to visit.      Per HPI unless specifically indicated in ROS section below Review of Systems     Objective:    BP 118/84   Pulse 78   Temp 98.3 F (36.8 C)   Ht 5' 4.5" (1.638 m)   Wt 195 lb (88.5 kg)   SpO2 95%   BMI 32.95 kg/m   Wt Readings from Last 3 Encounters:  05/20/17 195 lb (88.5 kg)  03/18/17 194 lb 12 oz (88.3 kg)  10/19/16 190 lb 5 oz (86.3 kg)    Physical Exam  Constitutional: She appears well-developed and well-nourished. No distress.  HENT:  Head: Normocephalic and atraumatic.  Right Ear: Hearing, tympanic membrane, external ear and ear canal normal.  Left Ear: Hearing, tympanic membrane, external ear and ear canal normal.  Nose: Mucosal edema and rhinorrhea present. Right sinus exhibits no maxillary sinus tenderness and no frontal sinus tenderness. Left sinus exhibits no maxillary sinus tenderness and no frontal sinus tenderness.  Mouth/Throat: Uvula is midline, oropharynx is clear and moist and mucous membranes are normal. No oropharyngeal exudate, posterior oropharyngeal edema, posterior oropharyngeal erythema or tonsillar abscesses.  Eyes: Pupils are equal,  round, and reactive to light. Conjunctivae and EOM are normal. No scleral icterus.  Neck: Normal range of motion. Neck supple.  Cardiovascular: Normal rate, regular rhythm, normal heart sounds and intact distal pulses.   No murmur heard. Pulmonary/Chest: Effort normal and breath sounds normal. No respiratory distress. She has no wheezes. She has no rales.  Lungs clear  Lymphadenopathy:    She has no cervical adenopathy.  Skin: Skin is warm and dry. No rash noted.  Nursing note and vitals reviewed.     Assessment & Plan:   Problem List Items Addressed This Visit    Viral bronchitis - Primary    Anticipate viral given short duration. Supportive care reviewed with  patient. Update if not improving with treatment.           Follow up plan: Return if symptoms worsen or fail to improve.  Ria Bush, MD

## 2017-05-20 NOTE — Assessment & Plan Note (Signed)
Anticipate viral given short duration  Supportive care reviewed with patient.  Update if not improving with treatment.  

## 2017-05-20 NOTE — Patient Instructions (Addendum)
I think you have viral bronchitis.  Antibiotics are not needed for this.  Viral infections usually take 7-10 days to resolve.  The cough can last a few weeks to go away. Use medication as prescribed: cheratussin codeine cough syrup mainly for night time and ibuprofen 600mg  with meals for next several days.  Push fluids and plenty of rest. Let us know if you are not improving as expected, or if you have high fevers (>101.5) or difficulty swallowing or worsening productive cough. Call clinic with questions.  Good to see you today. I hope you start feeling better soon.

## 2017-05-21 ENCOUNTER — Ambulatory Visit: Payer: BLUE CROSS/BLUE SHIELD | Admitting: Primary Care

## 2017-05-31 ENCOUNTER — Ambulatory Visit (INDEPENDENT_AMBULATORY_CARE_PROVIDER_SITE_OTHER): Payer: BLUE CROSS/BLUE SHIELD | Admitting: Family Medicine

## 2017-05-31 ENCOUNTER — Encounter: Payer: Self-pay | Admitting: Family Medicine

## 2017-05-31 ENCOUNTER — Ambulatory Visit (INDEPENDENT_AMBULATORY_CARE_PROVIDER_SITE_OTHER)
Admission: RE | Admit: 2017-05-31 | Discharge: 2017-05-31 | Disposition: A | Discharge status: Home / Self Care | Payer: BLUE CROSS/BLUE SHIELD | Source: Ambulatory Visit | Attending: Family Medicine | Admitting: Family Medicine

## 2017-05-31 VITALS — BP 120/80 | HR 78 | Ht 64.5 in | Wt 197.0 lb

## 2017-05-31 DIAGNOSIS — S92412A Displaced fracture of proximal phalanx of left great toe, initial encounter for closed fracture: Secondary | ICD-10-CM

## 2017-05-31 DIAGNOSIS — S99921A Unspecified injury of right foot, initial encounter: Secondary | ICD-10-CM

## 2017-05-31 NOTE — Progress Notes (Signed)
Subjective:   Patient ID: Jamie Koch, female    DOB: 1956-01-05, 61 y.o.   MRN: [id number]  Jamie Koch is a pleasant 61 y.o. year old female who presents to clinic today with Foot Pain ([place]. )  on 05/31/2017  HPI: Toe injury- Two days ago, was walking the park and her shoe got caught, trip and fell and landed on her knee and hand.  Toe on her right foot got caught under her.  Since that time, 2nd and third toe have been swollen and painful.  Has not wrapped them or taken any rx for the pain.  Current Outpatient Prescriptions on File Prior to Visit  Medication Sig Dispense Refill  . acetaminophen (TYLENOL) 325 MG tablet Take 650 mg by mouth every 6 (six) hours as needed.    Marland Kitchen aspirin 81 MG tablet Take 81 mg by mouth daily.    . cholecalciferol (VITAMIN D) 1000 units tablet Take 5,000 Units by mouth daily.    . cyclobenzaprine (FLEXERIL) 5 MG tablet Take 1 tablet (5 mg total) by mouth 3 (three) times daily as needed for muscle spasms. 15 tablet 0  . guaiFENesin-codeine (CHERATUSSIN AC) 100-10 MG/5ML syrup Take 5 mLs by mouth 2 (two) times daily as needed. 140 mL 0  . iron polysaccharides (NIFEREX) 150 MG capsule Take 150 mg by mouth daily.    . simvastatin (ZOCOR) 40 MG tablet TAKE 1/2 TABLET BY MOUTH DAILY 45 tablet 0  . Thiamine HCl (VITAMIN B-1) 250 MG tablet Take 250 mg by mouth daily.    Marland Kitchen thyroid (ARMOUR) 30 MG tablet Take 2 tablets (60 mg total) by mouth 2 (two) times daily. 360 tablet 3   No current facility-administered medications on file prior to visit.     No Known Allergies  Past Medical History:  Diagnosis Date  . Arthritis   . Chicken pox   . Depression   . Hay fever   . High cholesterol   . Thyroid dysfunction     Past Surgical History:  Procedure Laterality Date  . TONSILLECTOMY AND ADENOIDECTOMY  1964  . TOTAL KNEE ARTHROPLASTY Right     No family history on file.  Social History   Social History  .  Marital status: Married    Spouse name: N/A  . Number of children: N/A  . Years of education: N/A   Occupational History  . Not on file.   Social History Main Topics  . Smoking status: Never Smoker  . Smokeless tobacco: Never Used  . Alcohol use No  . Drug use: No  . Sexual activity: Not on file   Other Topics Concern  . Not on file   Social History Narrative   Married.   1 child.   Retired. Worked in Insurance claims handler.    Enjoys reading, gardening.    The PMH, PSH, Social History, Family History, Medications, and allergies have been reviewed in [facility], and have been updated if relevant.   Review of Systems  Musculoskeletal: Positive for arthralgias and joint swelling.  All other systems reviewed and are negative.      Objective:    BP 120/80   Pulse 78   Ht 5' 4.5" (1.638 m)   Wt 197 lb (89.4 kg)   SpO2 98%   BMI 33.29 kg/m    Physical Exam  Constitutional: She is oriented to person, place, and time. She appears well-developed and well-nourished. No distress.  HENT:  Head:  Normocephalic and atraumatic.  Eyes: Conjunctivae are normal.  Cardiovascular: Normal rate.   Pulmonary/Chest: Effort normal.  Musculoskeletal:       Feet:  Neurological: She is alert and oriented to person, place, and time. No cranial nerve deficit. Coordination normal.  Skin: Skin is warm and dry. She is not diaphoretic.  Psychiatric: She has a normal mood and affect. Her behavior is normal. Judgment and thought content normal.  Nursing note and vitals reviewed.         Assessment & Plan:   Displaced fracture of proximal phalanx of left great toe, initial encounter for closed fracture - Plan: Ambulatory referral to Orthopedic Surgery  Right foot injury, initial encounter - Plan: DG Foot Complete Right No Follow-up on file.

## 2017-05-31 NOTE — Assessment & Plan Note (Signed)
Probable toe fracture. Xray today to evaluate if it is severely displaced. Advised buddy taping. The patient indicates understanding of these issues and agrees with the plan.

## 2017-05-31 NOTE — Patient Instructions (Signed)
Dr. Milinda Pointer is a good podiatrist.   How to Buddy Tape Buddy taping refers to taping an injured finger or toe to an uninjured finger or toe that is next to it. This protects the injured finger or toe and keeps it from moving while the injury heals. You may buddy tape a finger or toe if you have a minor sprain. Your health care provider may buddy tape your finger or toe if you have a sprain, dislocation, or fracture. You may be told to replace your buddy taping as needed. What are the risks? Generally, buddy taping is safe. However, problems may occur, such as:  Skin injury or infection.  Reduced blood flow to the finger or toe.  Skin reaction to the tape.  Do not buddy tape your toe if you have diabetes. Do not buddy tape if you know that you have an allergy to adhesives or surgical tape. How to buddy tape Before Buddy Taping Try to reduce any pain and swelling with rest, icing, and elevation:  Avoid any activity that causes pain.  Raise (elevate) your hand or foot above the level of your heart while you are sitting or lying down.  If directed, apply ice to the injured area: ? Put ice in a plastic bag. ? Place a towel between your skin and the bag. ? Leave the ice on for 20 minutes, 2-3 times per day.  Buddy Taping Procedure  Clean and dry your finger or toe as told by your health care provider.  Place a gauze pad or a piece of cloth or cotton between your injured finger or toe and the uninjured finger or toe.  Use tape to wrap around both fingers or toes so your injured finger or toe is secured to the uninjured finger or toe. ? The tape should be snug, but not tight. ? Make sure the ends of the piece of tape overlap. ? Avoid placing tape directly over the joint.  Change the tape and the padding as told by your health care provider. Remove and replace the tape or padding if it becomes loose, worn, dirty, or wet. After Buddy Taping  Take over-the-counter and prescription  medicines only as told by your health care provider.  Return to your normal activities as told by your health care provider. Ask your health care provider what activities are safe for you.  Watch the buddy-taped area and always remove buddy taping if: ? Your pain gets worse. ? Your fingers turn pale or blue. ? Your skin becomes irritated. Contact a health care provider if:  You have pain, swelling, or bruising that lasts longer than three days.  You have a fever.  Your skin is red, cracked, or irritated. Get help right away if:  The injured area becomes cold, numb, or pale.  You have severe pain, swelling, bruising, or loss of movement in your finger or toe.  Your finger or toe changes shape (deformity). This information is not intended to replace advice given to you by your health care provider. Make sure you discuss any questions you have with your health care provider. Document Released: 12/03/2004 Document Revised: 04/02/2016 Document Reviewed: 03/20/2015 Elsevier Interactive Patient Education  [employer].

## 2017-07-01 ENCOUNTER — Other Ambulatory Visit: Payer: Self-pay | Admitting: Primary Care

## 2017-07-01 DIAGNOSIS — E785 Hyperlipidemia, unspecified: Secondary | ICD-10-CM

## 2017-08-05 DIAGNOSIS — S92919A Unspecified fracture of unspecified toe(s), initial encounter for closed fracture: Secondary | ICD-10-CM | POA: Insufficient documentation

## 2017-08-10 ENCOUNTER — Ambulatory Visit (INDEPENDENT_AMBULATORY_CARE_PROVIDER_SITE_OTHER): Payer: BLUE CROSS/BLUE SHIELD

## 2017-08-10 DIAGNOSIS — Z23 Encounter for immunization: Secondary | ICD-10-CM

## 2017-10-10 ENCOUNTER — Other Ambulatory Visit: Payer: Self-pay | Admitting: Primary Care

## 2017-10-10 DIAGNOSIS — E039 Hypothyroidism, unspecified: Secondary | ICD-10-CM

## 2017-10-11 NOTE — Telephone Encounter (Signed)
Ok to refill? Electronically refill request for ARMOUR THYROID 30 MG tablet  Last prescribed on 08/05/2016. Last seen on 03/18/2017

## 2017-10-11 NOTE — Telephone Encounter (Signed)
Will allow for 30 day supply, but will need office visit follow up for any additional refills. Please schedule.

## 2017-10-12 NOTE — Telephone Encounter (Signed)
Send patient a message through MyChart. 

## 2017-10-18 ENCOUNTER — Ambulatory Visit: Payer: BLUE CROSS/BLUE SHIELD | Admitting: Primary Care

## 2017-12-01 ENCOUNTER — Encounter: Payer: Self-pay | Admitting: Primary Care

## 2017-12-25 ENCOUNTER — Other Ambulatory Visit: Payer: Self-pay | Admitting: Primary Care

## 2017-12-25 DIAGNOSIS — E785 Hyperlipidemia, unspecified: Secondary | ICD-10-CM

## 2018-01-07 ENCOUNTER — Other Ambulatory Visit (INDEPENDENT_AMBULATORY_CARE_PROVIDER_SITE_OTHER): Payer: BLUE CROSS/BLUE SHIELD

## 2018-01-07 ENCOUNTER — Encounter: Payer: Self-pay | Admitting: Primary Care

## 2018-01-07 ENCOUNTER — Ambulatory Visit: Payer: BLUE CROSS/BLUE SHIELD | Admitting: Primary Care

## 2018-01-07 ENCOUNTER — Other Ambulatory Visit: Payer: Self-pay | Admitting: Primary Care

## 2018-01-07 VITALS — BP 124/76 | HR 78 | Temp 98.0°F | Ht 64.5 in | Wt 198.4 lb

## 2018-01-07 DIAGNOSIS — D509 Iron deficiency anemia, unspecified: Secondary | ICD-10-CM

## 2018-01-07 DIAGNOSIS — E785 Hyperlipidemia, unspecified: Secondary | ICD-10-CM

## 2018-01-07 DIAGNOSIS — R7309 Other abnormal glucose: Secondary | ICD-10-CM

## 2018-01-07 DIAGNOSIS — E039 Hypothyroidism, unspecified: Secondary | ICD-10-CM | POA: Diagnosis not present

## 2018-01-07 LAB — COMPREHENSIVE METABOLIC PANEL
ALT: 18 U/L (ref 0–35)
AST: 15 U/L (ref 0–37)
Albumin: 4 g/dL (ref 3.5–5.2)
Alkaline Phosphatase: 59 U/L (ref 39–117)
BUN: 13 mg/dL (ref 6–23)
CHLORIDE: 104 meq/L (ref 96–112)
CO2: 32 meq/L (ref 19–32)
Calcium: 10 mg/dL (ref 8.4–10.5)
Creatinine, Ser: 0.82 mg/dL (ref 0.40–1.20)
GFR: 75 mL/min (ref >60.00)
GLUCOSE: 109 mg/dL — AB (ref 70–99)
POTASSIUM: 4 meq/L (ref 3.5–5.1)
SODIUM: 144 meq/L (ref 135–145)
Total Bilirubin: 0.6 mg/dL (ref 0.2–1.2)
Total Protein: 6.9 g/dL (ref 6.0–8.3)

## 2018-01-07 LAB — T4, FREE: FREE T4: 0.76 ng/dL (ref 0.60–1.60)

## 2018-01-07 LAB — IBC PANEL
Iron: 104 ug/dL (ref 42–145)
Saturation Ratios: 29 % (ref 20.0–50.0)
Transferrin: 256 mg/dL (ref 212.0–360.0)

## 2018-01-07 LAB — CBC
HCT: 42.6 % (ref 36.0–46.0)
HEMOGLOBIN: 14.7 g/dL (ref 12.0–15.0)
MCHC: 34.6 g/dL (ref 30.0–36.0)
MCV: 87.6 fl (ref 78.0–100.0)
Platelets: 303 10*3/uL (ref 150.0–400.0)
RBC: 4.86 Mil/uL (ref 3.87–5.11)
RDW: 13.1 % (ref 11.5–15.5)
WBC: 8.1 10*3/uL (ref 4.0–10.5)

## 2018-01-07 LAB — LIPID PANEL
CHOL/HDL RATIO: 4
Cholesterol: 134 mg/dL (ref 0–200)
HDL: 34.7 mg/dL — ABNORMAL LOW (ref >39.00)
LDL Cholesterol: 66 mg/dL (ref 0–99)
NONHDL: 99.03
Triglycerides: 165 mg/dL — ABNORMAL HIGH (ref 0.0–149.0)
VLDL: 33 mg/dL (ref 0.0–40.0)

## 2018-01-07 LAB — HEMOGLOBIN A1C: HEMOGLOBIN A1C: 5.8 % (ref 4.6–6.5)

## 2018-01-07 LAB — TSH: TSH: 0.74 u[IU]/mL (ref 0.35–4.50)

## 2018-01-07 MED ORDER — THYROID 30 MG PO TABS: ORAL_TABLET | ORAL | 3 refills | Status: DC

## 2018-01-07 MED ORDER — SIMVASTATIN 40 MG PO TABS: ORAL_TABLET | ORAL | 3 refills | Status: DC

## 2018-01-07 NOTE — Assessment & Plan Note (Signed)
CBC and IBC panel pending today.  Taking oral iron intermittently.

## 2018-01-07 NOTE — Patient Instructions (Addendum)
Stop by the lab prior to leaving today. I will notify you of your results once received.   Start exercising. You should be getting 150 minutes of moderate intensity exercise weekly.  It's important to improve your diet by reducing consumption of fast food, fried food, processed snack foods, sugary drinks. Increase consumption of fresh vegetables and fruits, whole grains, water.  Ensure you are drinking 64 ounces of water daily.  It was a pleasure to see you today!  

## 2018-01-07 NOTE — Assessment & Plan Note (Signed)
Overdue for TSH check, pending today. Also check Free T4. Discussed correct instructions for administration of thyroid medication, she verbalized understanding. Anticipate the need to recheck labs in 6 weeks once she starts taking medication appropriately, await labs today.

## 2018-01-07 NOTE — Progress Notes (Signed)
Subjective:    Patient ID: Jamie Koch, female    DOB: 11-Jun-1956, 62 y.o.   MRN: [id number]  HPI  Jamie Koch is a 62 year old female who presents today for follow up.  1) Hypothyroidism: Currently managed on Armour Thyroid 30 mg and is taking 60 mg BID. Her last TSH was 6.37 in December 2017. She's sometimes taking her medication with coffee and food, most of the time taking with water and no food. She's not had her medication this morning.   2) Iron Deficiency Anemia: Currently managed on oral iron at 150 mg. She's sometimes taking her iron tablets, recently started B vitamins, complex.   3) Hyperlipidemia: Currently managed on simvastatin 40 mg. Last lipid panel will TC of 192, LDL of 123 in December 2017. She denies myalgias and is compliant to her Simvastatin.   Review of Systems  Constitutional: Negative for fatigue.  Eyes: Negative for visual disturbance.  Respiratory: Negative for shortness of breath.   Cardiovascular: Negative for chest pain and palpitations.  Neurological: Negative for dizziness.       Past Medical History:  Diagnosis Date  . Arthritis   . Chicken pox   . Depression   . Hay fever   . High cholesterol   . Thyroid dysfunction      Social History   Socioeconomic History  . Marital status: Married    Spouse name: Not on file  . Number of children: Not on file  . Years of education: Not on file  . Highest education level: Not on file  Social Needs  . Financial resource strain: Not on file  . Food insecurity - worry: Not on file  . Food insecurity - inability: Not on file  . Transportation needs - medical: Not on file  . Transportation needs - non-medical: Not on file  Occupational History  . Not on file  Tobacco Use  . Smoking status: Never Smoker  . Smokeless tobacco: Never Used  Substance and Sexual Activity  . Alcohol use: No  . Drug use: No  . Sexual activity: Not on file  Other Topics Concern  . Not on file  Social History  Narrative   Married.   1 child.   Retired. Worked in Insurance claims handler.    Enjoys reading, gardening.     Past Surgical History:  Procedure Laterality Date  . TONSILLECTOMY AND ADENOIDECTOMY  1964  . TOTAL KNEE ARTHROPLASTY Right     No family history on file.  No Known Allergies  Current Outpatient Medications on File Prior to Visit  Medication Sig Dispense Refill  . acetaminophen (TYLENOL) 325 MG tablet Take 650 mg by mouth every 6 (six) hours as needed.    Francia Greaves THYROID 30 MG tablet TAKE 2 TABLETS(60 MG) BY MOUTH TWICE DAILY 120 tablet 0  . aspirin 81 MG tablet Take 81 mg by mouth daily.    . cholecalciferol (VITAMIN D) 1000 units tablet Take 5,000 Units by mouth daily.    . iron polysaccharides (NIFEREX) 150 MG capsule Take 150 mg by mouth daily.    . simvastatin (ZOCOR) 40 MG tablet Take 1/2 tablet by mouth daily NEED APPOINTMENT FOR ANY MORE REFILLS 45 tablet 0  . Thiamine HCl (VITAMIN B-1) 250 MG tablet Take 250 mg by mouth daily.     No current facility-administered medications on file prior to visit.     BP 124/76   Pulse 78   Temp 98 F (36.7 C) (Oral)  Ht 5' 4.5" (1.638 m)   Wt 198 lb 6.4 oz (90 kg)   SpO2 96%   BMI 33.53 kg/m    Objective:   Physical Exam  Constitutional: She appears well-nourished.  Neck: Neck supple. No thyromegaly present.  Cardiovascular: Normal rate and regular rhythm.  Pulmonary/Chest: Effort normal and breath sounds normal.  Skin: Skin is warm and dry.          Assessment & Plan:

## 2018-01-07 NOTE — Assessment & Plan Note (Signed)
Repeat lipid panel pending. Will refill medications once labs return.  Discussed the importance of a healthy diet and regular exercise in order for weight loss, and to reduce the risk of any potential medical problems.

## 2018-01-08 ENCOUNTER — Encounter: Payer: Self-pay | Admitting: Primary Care

## 2018-01-09 ENCOUNTER — Other Ambulatory Visit: Payer: Self-pay | Admitting: Primary Care

## 2018-01-09 DIAGNOSIS — E039 Hypothyroidism, unspecified: Secondary | ICD-10-CM

## 2018-01-10 ENCOUNTER — Encounter: Payer: Self-pay | Admitting: Primary Care

## 2018-02-08 ENCOUNTER — Ambulatory Visit: Payer: BLUE CROSS/BLUE SHIELD | Admitting: Primary Care

## 2018-02-08 ENCOUNTER — Encounter: Payer: Self-pay | Admitting: Primary Care

## 2018-02-08 VITALS — BP 142/82 | HR 73 | Temp 98.2°F | Ht 64.5 in | Wt 198.5 lb

## 2018-02-08 DIAGNOSIS — Z01818 Encounter for other preprocedural examination: Secondary | ICD-10-CM

## 2018-02-08 NOTE — Patient Instructions (Addendum)
You are cleared for surgery, we will fax your forms to the orthopedist.   It was a pleasure to see you today!

## 2018-02-08 NOTE — Progress Notes (Signed)
Subjective:    Patient ID: Jamie Koch, female    DOB: 1955/12/18, 62 y.o.   MRN: [id number]  HPI  Jamie Koch is a 62 year old female who presents today for surgical clearance.   She will be undergoing left partial knee replacement on 03/10/2018 through [facility].  She was last evaluated one month ago in our office for follow up and had lab work completed at that time.   She denies chest pain, shortness of breath, abdominal pain. She will be seeing a gynecologist next week for hormone evaluation. She does have a history of osteopenia and takes calcium and vitamin D daily. She is due for a bone density scan and will have this done at the GYN office.   BP Readings from Last 3 Encounters:  02/08/18 (!) 142/82  01/07/18 124/76  05/31/17 120/80     Review of Systems  Constitutional: Negative for unexpected weight change.  HENT: Negative for rhinorrhea.   Respiratory: Negative for cough and shortness of breath.   Cardiovascular: Negative for chest pain.  Gastrointestinal: Negative for abdominal pain.  Genitourinary: Negative for difficulty urinating and menstrual problem.  Musculoskeletal: Positive for arthralgias. Negative for myalgias.       Chronic left knee pain  Skin: Negative for rash.  Allergic/Immunologic: Negative for environmental allergies.  Neurological: Negative for numbness and headaches.  Psychiatric/Behavioral: The patient is not nervous/anxious.        Past Medical History:  Diagnosis Date  . Arthritis   . Chicken pox   . Depression   . Hay fever   . High cholesterol   . Thyroid dysfunction      Social History   Socioeconomic History  . Marital status: Married    Spouse name: Not on file  . Number of children: Not on file  . Years of education: Not on file  . Highest education level: Not on file  Occupational History  . Not on file  Social Needs  . Financial resource strain: Not on file  . Food insecurity:    Worry: Not on  file    Inability: Not on file  . Transportation needs:    Medical: Not on file    Non-medical: Not on file  Tobacco Use  . Smoking status: Never Smoker  . Smokeless tobacco: Never Used  Substance and Sexual Activity  . Alcohol use: No  . Drug use: No  . Sexual activity: Not on file  Lifestyle  . Physical activity:    Days per week: Not on file    Minutes per session: Not on file  . Stress: Not on file  Relationships  . Social connections:    Talks on phone: Not on file    Gets together: Not on file    Attends religious service: Not on file    Active member of club or organization: Not on file    Attends meetings of clubs or organizations: Not on file    Relationship status: Not on file  . Intimate partner violence:    Fear of current or ex partner: Not on file    Emotionally abused: Not on file    Physically abused: Not on file    Forced sexual activity: Not on file  Other Topics Concern  . Not on file  Social History Narrative   Married.   1 child.   Retired. Worked in Insurance claims handler.    Enjoys reading, gardening.     Past Surgical History:  Procedure  Laterality Date  . TONSILLECTOMY AND ADENOIDECTOMY  1964  . TOTAL KNEE ARTHROPLASTY Right     No family history on file.  No Known Allergies  Current Outpatient Medications on File Prior to Visit  Medication Sig Dispense Refill  . acetaminophen (TYLENOL) 325 MG tablet Take 650 mg by mouth every 6 (six) hours as needed.    Jamie Koch THYROID 30 MG tablet TAKE 2 TABLETS BY MOUTH TWICE DAILY 360 tablet 3  . aspirin 81 MG tablet Take 81 mg by mouth daily.    . cholecalciferol (VITAMIN D) 1000 units tablet Take 5,000 Units by mouth daily.    . iron polysaccharides (NIFEREX) 150 MG capsule Take 150 mg by mouth daily.    . meloxicam (MOBIC) 15 MG tablet   1  . simvastatin (ZOCOR) 40 MG tablet Take 1/2 tablet by mouth daily . 45 tablet 3  . Thiamine HCl (VITAMIN B-1) 250 MG tablet Take 250 mg by mouth daily.      No current facility-administered medications on file prior to visit.     BP (!) 142/82   Pulse 73   Temp 98.2 F (36.8 C) (Oral)   Ht 5' 4.5" (1.638 m)   Wt 198 lb 8 oz (90 kg)   SpO2 98%   BMI 33.55 kg/m    Objective:   Physical Exam  Constitutional: She is oriented to person, place, and time. She appears well-nourished.  HENT:  Right Ear: Tympanic membrane and ear canal normal.  Left Ear: Tympanic membrane and ear canal normal.  Nose: Nose normal.  Mouth/Throat: Oropharynx is clear and moist.  Eyes: Pupils are equal, round, and reactive to light. Conjunctivae and EOM are normal.  Neck: Neck supple. No thyromegaly present.  Cardiovascular: Normal rate and regular rhythm.  No murmur heard. Pulmonary/Chest: Effort normal and breath sounds normal. She has no rales.  Abdominal: Soft. Bowel sounds are normal. There is no tenderness.  Musculoskeletal: Normal range of motion.  Lymphadenopathy:    She has no cervical adenopathy.  Neurological: She is alert and oriented to person, place, and time. She has normal reflexes. No cranial nerve deficit.  Skin: Skin is warm and dry. No rash noted.  Psychiatric: She has a normal mood and affect.          Assessment & Plan:  Pre-Operative Clearance:  Scheduled for left partial knee replacement on 03/10/18. Labs from visit one month ago unremarkable, no need to repeat. ECG today with NSR, rate of 73, no St elevation/depression, T-wave abnormality. Tetanus vaccination UTD. Cleared for surgery and will fax forms.  Jamie Koch, NP

## 2018-02-10 ENCOUNTER — Encounter: Payer: Self-pay | Admitting: Primary Care

## 2018-02-10 DIAGNOSIS — M8580 Other specified disorders of bone density and structure, unspecified site: Secondary | ICD-10-CM

## 2018-03-26 ENCOUNTER — Other Ambulatory Visit: Payer: Self-pay | Admitting: Primary Care

## 2018-03-26 DIAGNOSIS — E785 Hyperlipidemia, unspecified: Secondary | ICD-10-CM

## 2018-04-09 ENCOUNTER — Other Ambulatory Visit: Payer: Self-pay | Admitting: Primary Care

## 2018-04-09 DIAGNOSIS — E039 Hypothyroidism, unspecified: Secondary | ICD-10-CM

## 2018-04-19 ENCOUNTER — Ambulatory Visit
Admission: RE | Admit: 2018-04-19 | Discharge: 2018-04-19 | Disposition: A | Discharge status: Home / Self Care | Payer: BLUE CROSS/BLUE SHIELD | Source: Ambulatory Visit | Attending: Primary Care | Admitting: Primary Care

## 2018-04-19 DIAGNOSIS — M8580 Other specified disorders of bone density and structure, unspecified site: Secondary | ICD-10-CM | POA: Insufficient documentation

## 2018-04-22 ENCOUNTER — Encounter: Payer: Self-pay | Admitting: Primary Care

## 2018-04-26 ENCOUNTER — Other Ambulatory Visit: Payer: Self-pay | Admitting: Primary Care

## 2018-04-26 DIAGNOSIS — Z1239 Encounter for other screening for malignant neoplasm of breast: Secondary | ICD-10-CM

## 2018-05-09 ENCOUNTER — Ambulatory Visit
Admission: RE | Admit: 2018-05-09 | Discharge: 2018-05-09 | Disposition: A | Discharge status: Home / Self Care | Payer: BLUE CROSS/BLUE SHIELD | Source: Ambulatory Visit | Attending: Primary Care | Admitting: Primary Care

## 2018-05-09 DIAGNOSIS — Z1239 Encounter for other screening for malignant neoplasm of breast: Secondary | ICD-10-CM

## 2018-05-09 DIAGNOSIS — Z1231 Encounter for screening mammogram for malignant neoplasm of breast: Secondary | ICD-10-CM | POA: Insufficient documentation

## 2018-05-11 ENCOUNTER — Other Ambulatory Visit: Payer: Self-pay | Admitting: Primary Care

## 2018-05-11 DIAGNOSIS — R928 Other abnormal and inconclusive findings on diagnostic imaging of breast: Secondary | ICD-10-CM

## 2018-05-18 ENCOUNTER — Ambulatory Visit
Admission: RE | Admit: 2018-05-18 | Discharge: 2018-05-18 | Disposition: A | Discharge status: Home / Self Care | Payer: BLUE CROSS/BLUE SHIELD | Source: Ambulatory Visit | Attending: Primary Care | Admitting: Primary Care

## 2018-05-18 DIAGNOSIS — R928 Other abnormal and inconclusive findings on diagnostic imaging of breast: Secondary | ICD-10-CM | POA: Diagnosis present

## 2018-08-17 ENCOUNTER — Ambulatory Visit (INDEPENDENT_AMBULATORY_CARE_PROVIDER_SITE_OTHER): Payer: BLUE CROSS/BLUE SHIELD | Admitting: Family Medicine

## 2018-08-17 ENCOUNTER — Encounter: Payer: Self-pay | Admitting: Family Medicine

## 2018-08-17 VITALS — BP 134/84 | HR 71 | Temp 98.3°F | Ht 64.5 in | Wt 196.0 lb

## 2018-08-17 DIAGNOSIS — L309 Dermatitis, unspecified: Secondary | ICD-10-CM

## 2018-08-17 MED ORDER — BETAMETHASONE DIPROPIONATE 0.05 % EX CREA: TOPICAL_CREAM | Freq: Two times a day (BID) | CUTANEOUS | 0 refills | Status: DC

## 2018-08-17 NOTE — Patient Instructions (Signed)
Good to see you today  I have sent in a prescription to your pharmacy for your hand  Please let me know if it gets worse or if no improvement

## 2018-08-17 NOTE — Progress Notes (Signed)
   Subjective:    Patient ID: Jamie Koch, female    DOB: 07-Jan-1956, 62 y.o.   MRN: [id number]  HPI This is a 62 yo female who presents today with rash on her left hand x 2 weeks. Has had in past and it went away, came back. No new soaps, lotions, detergents, etc. Is left handed. She has not had any new medications. Has been using some new facial products but her face is not affected.  Has had eczema in past with frequent handwashing and doing dishes.   Past Medical History:  Diagnosis Date  . Arthritis   . Chicken pox   . Depression   . Hay fever   . High cholesterol   . Thyroid dysfunction    Past Surgical History:  Procedure Laterality Date  . TONSILLECTOMY AND ADENOIDECTOMY  1964  . TOTAL KNEE ARTHROPLASTY Right    No family history on file. Social History   Tobacco Use  . Smoking status: Never Smoker  . Smokeless tobacco: Never Used  Substance Use Topics  . Alcohol use: No  . Drug use: No      Review of Systems Per HPI    Objective:   Physical Exam  Constitutional: She is oriented to person, place, and time. She appears well-developed and well-nourished. No distress.  Eyes: Conjunctivae are normal.  Cardiovascular: Normal rate.  Pulmonary/Chest: Effort normal.  Neurological: She is alert and oriented to person, place, and time.  Skin: Skin is warm and dry. Rash (left palm with 1.5 cm area erythema, mildly raised ) noted. She is not diaphoretic.  Psychiatric: She has a normal mood and affect. Her behavior is normal. Judgment and thought content normal.  Vitals reviewed.     BP 134/84   Pulse 71   Temp 98.3 F (36.8 C) (Oral)   Ht 5' 4.5" (1.638 m)   Wt 196 lb (88.9 kg)   SpO2 97%   BMI 33.12 kg/m  Wt Readings from Last 3 Encounters:  08/17/18 196 lb (88.9 kg)  02/08/18 198 lb 8 oz (90 kg)  01/07/18 198 lb 6.4 oz (90 kg)       Assessment & Plan:  1. Dermatitis - Provided written and verbal information regarding diagnosis and treatment. -  RTC precautions reviewed - betamethasone dipropionate (DIPROLENE) 0.05 % cream; Apply topically 2 (two) times daily. For up to 10 days.  Dispense: 15 g; Refill: 0   Clarene Reamer, FNP-BC  [facility]  08/17/2018 2:21 PM

## 2018-09-20 ENCOUNTER — Other Ambulatory Visit: Payer: Self-pay | Admitting: Primary Care

## 2018-09-20 DIAGNOSIS — E785 Hyperlipidemia, unspecified: Secondary | ICD-10-CM

## 2018-09-27 ENCOUNTER — Other Ambulatory Visit: Payer: Self-pay | Admitting: Primary Care

## 2018-09-27 DIAGNOSIS — E039 Hypothyroidism, unspecified: Secondary | ICD-10-CM

## 2018-12-18 ENCOUNTER — Other Ambulatory Visit: Payer: Self-pay | Admitting: Primary Care

## 2018-12-18 DIAGNOSIS — E785 Hyperlipidemia, unspecified: Secondary | ICD-10-CM

## 2019-03-14 ENCOUNTER — Other Ambulatory Visit: Payer: Self-pay | Admitting: Primary Care

## 2019-03-14 DIAGNOSIS — E785 Hyperlipidemia, unspecified: Secondary | ICD-10-CM

## 2019-06-10 ENCOUNTER — Other Ambulatory Visit: Payer: Self-pay | Admitting: Primary Care

## 2019-06-10 DIAGNOSIS — E785 Hyperlipidemia, unspecified: Secondary | ICD-10-CM

## 2019-06-13 ENCOUNTER — Other Ambulatory Visit: Payer: Self-pay | Admitting: Primary Care

## 2019-06-13 DIAGNOSIS — E785 Hyperlipidemia, unspecified: Secondary | ICD-10-CM

## 2019-07-11 ENCOUNTER — Other Ambulatory Visit: Payer: Self-pay

## 2019-07-11 ENCOUNTER — Ambulatory Visit: Payer: BLUE CROSS/BLUE SHIELD | Admitting: Primary Care

## 2019-07-11 ENCOUNTER — Other Ambulatory Visit (HOSPITAL_COMMUNITY): Admission: RE | Admit: 2019-07-11 | Payer: BLUE CROSS/BLUE SHIELD | Source: Ambulatory Visit

## 2019-07-11 ENCOUNTER — Encounter: Payer: Self-pay | Admitting: Primary Care

## 2019-07-11 VITALS — BP 130/78 | HR 84 | Temp 98.3°F | Ht 64.5 in | Wt 204.5 lb

## 2019-07-11 DIAGNOSIS — M1990 Unspecified osteoarthritis, unspecified site: Secondary | ICD-10-CM

## 2019-07-11 DIAGNOSIS — E785 Hyperlipidemia, unspecified: Secondary | ICD-10-CM | POA: Diagnosis not present

## 2019-07-11 DIAGNOSIS — E039 Hypothyroidism, unspecified: Secondary | ICD-10-CM | POA: Diagnosis not present

## 2019-07-11 DIAGNOSIS — R7303 Prediabetes: Secondary | ICD-10-CM

## 2019-07-11 DIAGNOSIS — Z0000 Encounter for general adult medical examination without abnormal findings: Secondary | ICD-10-CM

## 2019-07-11 DIAGNOSIS — D509 Iron deficiency anemia, unspecified: Secondary | ICD-10-CM

## 2019-07-11 DIAGNOSIS — Z124 Encounter for screening for malignant neoplasm of cervix: Secondary | ICD-10-CM

## 2019-07-11 DIAGNOSIS — E1165 Type 2 diabetes mellitus with hyperglycemia: Secondary | ICD-10-CM | POA: Insufficient documentation

## 2019-07-11 LAB — CBC
HCT: 46.7 % — ABNORMAL HIGH (ref 36.0–46.0)
Hemoglobin: 15.9 g/dL — ABNORMAL HIGH (ref 12.0–15.0)
MCHC: 33.9 g/dL (ref 30.0–36.0)
MCV: 90.2 fl (ref 78.0–100.0)
Platelets: 338 10*3/uL (ref 150.0–400.0)
RBC: 5.18 Mil/uL — ABNORMAL HIGH (ref 3.87–5.11)
RDW: 13.7 % (ref 11.5–15.5)
WBC: 8.8 10*3/uL (ref 4.0–10.5)

## 2019-07-11 LAB — COMPREHENSIVE METABOLIC PANEL
ALT: 21 U/L (ref 0–35)
AST: 19 U/L (ref 0–37)
Albumin: 4.4 g/dL (ref 3.5–5.2)
Alkaline Phosphatase: 51 U/L (ref 39–117)
BUN: 10 mg/dL (ref 6–23)
CO2: 32 mEq/L (ref 19–32)
Calcium: 9.1 mg/dL (ref 8.4–10.5)
Chloride: 100 mEq/L (ref 96–112)
Creatinine, Ser: 0.92 mg/dL (ref 0.40–1.20)
GFR: 61.49 mL/min (ref >60.00)
Glucose, Bld: 109 mg/dL — ABNORMAL HIGH (ref 70–99)
Potassium: 3.7 mEq/L (ref 3.5–5.1)
Sodium: 141 mEq/L (ref 135–145)
Total Bilirubin: 0.7 mg/dL (ref 0.2–1.2)
Total Protein: 6.6 g/dL (ref 6.0–8.3)

## 2019-07-11 LAB — LIPID PANEL
Cholesterol: 144 mg/dL (ref 0–200)
HDL: 37.5 mg/dL — ABNORMAL LOW (ref >39.00)
NonHDL: 106.99
Total CHOL/HDL Ratio: 4
Triglycerides: 207 mg/dL — ABNORMAL HIGH (ref 0.0–149.0)
VLDL: 41.4 mg/dL — ABNORMAL HIGH (ref 0.0–40.0)

## 2019-07-11 LAB — IBC + FERRITIN
Ferritin: 16.5 ng/mL (ref 10.0–291.0)
Iron: 167 ug/dL — ABNORMAL HIGH (ref 42–145)
Saturation Ratios: 46.6 % (ref 20.0–50.0)
Transferrin: 256 mg/dL (ref 212.0–360.0)

## 2019-07-11 LAB — HEMOGLOBIN A1C: Hgb A1c MFr Bld: 5.9 % (ref 4.6–6.5)

## 2019-07-11 LAB — LDL CHOLESTEROL, DIRECT: Direct LDL: 91 mg/dL

## 2019-07-11 MED ORDER — SIMVASTATIN 40 MG PO TABS: ORAL_TABLET | ORAL | 3 refills | Status: DC

## 2019-07-11 NOTE — Assessment & Plan Note (Signed)
Following with Lyn Henri MD for hormonal treatment and management of Armour Thyroid. Lyn Henri MD is refilling.

## 2019-07-11 NOTE — Assessment & Plan Note (Signed)
Immunizations UTD. Pap smear due, completed today. Mammogram due in 2021 per patient preference. Colon cancer screening UTD, due in 2021. Encouraged regular exercise, healthy diet. Exam today unremarkable. Labs pending.

## 2019-07-11 NOTE — Patient Instructions (Addendum)
Stop by the lab prior to leaving today. I will notify you of your results once received.   Start exercising. You should be getting 150 minutes of moderate intensity exercise weekly.  It's important to improve your diet by reducing consumption of fast food, fried food, processed snack foods, sugary drinks. Increase consumption of fresh vegetables and fruits, whole grains, water.  Ensure you are drinking 64 ounces of water daily.  It was a pleasure to see you today!

## 2019-07-11 NOTE — Assessment & Plan Note (Signed)
A1C of 5.8 on labs from last year. Repeat A1C pending.

## 2019-07-11 NOTE — Assessment & Plan Note (Signed)
Doing well on Tylenol as needed. Continue same.

## 2019-07-11 NOTE — Assessment & Plan Note (Signed)
Compliant to Simvastatin daily, repeat lipids pending. Continue same.

## 2019-07-11 NOTE — Progress Notes (Signed)
Subjective:    Patient ID: Jamie Koch, female    DOB: June 06, 1956, 63 y.o.   MRN: [id number]  HPI  Jamie Koch is a 63 year old female who presents today for complete physical.  Immunizations: -Tetanus: Completed in 2016 -Influenza: Completed at pharmacy -Pneumonia: Completed in 2011, due at 53 -Shingles: Completed in 2017  Diet: She endorses a fair diet. She is mostly eating pasta, chicken, fish, little vegetables, salads. Desserts 3-4 times weekly. Drinking diet Sprite, coffee, water.  Exercise: She is not exercising  Eye exam: Completed 3 years ago Dental exam: Completes semi-annually  Colonoscopy: Completed Cologuard in 2018, due in 2021 Pap Smear: Completed in 2017, due  Mammogram: Completed in July 2019, prefers to wait until 2021 Hep C Screen: negative  BP Readings from Last 3 Encounters:  07/11/19 130/78  08/17/18 134/84  02/08/18 (!) 142/82     Review of Systems  Constitutional: Negative for unexpected weight change.  HENT: Negative for rhinorrhea.   Respiratory: Negative for cough and shortness of breath.   Cardiovascular: Negative for chest pain.  Gastrointestinal: Negative for constipation and diarrhea.  Genitourinary: Negative for difficulty urinating.  Musculoskeletal: Positive for arthralgias. Negative for myalgias.       Chronic intermittent arthralgias.   Skin: Negative for rash.  Allergic/Immunologic: Negative for environmental allergies.  Neurological: Negative for dizziness, numbness and headaches.  Psychiatric/Behavioral: The patient is not nervous/anxious.        Past Medical History:  Diagnosis Date  . Arthritis   . Chicken pox   . Depression   . Hay fever   . High cholesterol   . Thyroid dysfunction      Social History   Socioeconomic History  . Marital status: Married    Spouse name: Not on file  . Number of children: Not on file  . Years of education: Not on file  . Highest education level: Not on file  Occupational  History  . Not on file  Social Needs  . Financial resource strain: Not on file  . Food insecurity    Worry: Not on file    Inability: Not on file  . Transportation needs    Medical: Not on file    Non-medical: Not on file  Tobacco Use  . Smoking status: Never Smoker  . Smokeless tobacco: Never Used  Substance and Sexual Activity  . Alcohol use: No  . Drug use: No  . Sexual activity: Not on file  Lifestyle  . Physical activity    Days per week: Not on file    Minutes per session: Not on file  . Stress: Not on file  Relationships  . Social Herbalist on phone: Not on file    Gets together: Not on file    Attends religious service: Not on file    Active member of club or organization: Not on file    Attends meetings of clubs or organizations: Not on file    Relationship status: Not on file  . Intimate partner violence    Fear of current or ex partner: Not on file    Emotionally abused: Not on file    Physically abused: Not on file    Forced sexual activity: Not on file  Other Topics Concern  . Not on file  Social History Narrative   Married.   1 child.   Retired. Worked in Insurance claims handler.    Enjoys reading, gardening.     Past Surgical History:  Procedure Laterality Date  . TONSILLECTOMY AND ADENOIDECTOMY  1964  . TOTAL KNEE ARTHROPLASTY Right     No family history on file.  No Known Allergies  Current Outpatient Medications on File Prior to Visit  Medication Sig Dispense Refill  . acetaminophen (TYLENOL) 325 MG tablet Take 650 mg by mouth every 6 (six) hours as needed.    Francia Greaves THYROID 30 MG tablet TAKE 2 TABLETS BY MOUTH TWICE DAILY 360 tablet 0  . aspirin 81 MG tablet Take 81 mg by mouth daily.    . betamethasone dipropionate (DIPROLENE) 0.05 % cream Apply topically 2 (two) times daily. For up to 10 days. 15 g 0  . cholecalciferol (VITAMIN D) 1000 units tablet Take 5,000 Units by mouth daily.    . iron polysaccharides (NIFEREX) 150 MG  capsule Take 150 mg by mouth daily.    . Thiamine HCl (VITAMIN B-1) 250 MG tablet Take 250 mg by mouth daily.     No current facility-administered medications on file prior to visit.     BP 130/78   Pulse 84   Temp 98.3 F (36.8 C) (Temporal)   Ht 5' 4.5" (1.638 m)   Wt 204 lb 8 oz (92.8 kg)   SpO2 98%   BMI 34.56 kg/m    Objective:   Physical Exam  Constitutional: She is oriented to person, place, and time. She appears well-nourished.  HENT:  Right Ear: Tympanic membrane and ear canal normal.  Left Ear: Tympanic membrane and ear canal normal.  Mouth/Throat: Oropharynx is clear and moist.  Eyes: Pupils are equal, round, and reactive to light. EOM are normal.  Neck: Neck supple.  Cardiovascular: Normal rate and regular rhythm.  Respiratory: Effort normal and breath sounds normal.  GI: Soft. Bowel sounds are normal. There is no abdominal tenderness.  Genitourinary: There is no tenderness or lesion on the right labia. There is no tenderness or lesion on the left labia. Cervix exhibits no motion tenderness and no discharge.    No vaginal discharge or erythema.  No erythema in the vagina.  Musculoskeletal: Normal range of motion.  Neurological: She is alert and oriented to person, place, and time.  Skin: Skin is warm and dry.  Psychiatric: She has a normal mood and affect.           Assessment & Plan:

## 2019-07-11 NOTE — Assessment & Plan Note (Signed)
No longer taking oral iron, will repeat CBC with iron studies. Denies fatigue and other symptoms of anemia.

## 2019-07-12 LAB — CYTOLOGY - PAP
Diagnosis: NEGATIVE
HPV: NOT DETECTED

## 2019-08-29 ENCOUNTER — Encounter: Payer: BLUE CROSS/BLUE SHIELD | Admitting: Primary Care

## 2020-06-15 ENCOUNTER — Emergency Department
Admission: EM | Admit: 2020-06-15 | Discharge: 2020-06-15 | Disposition: A | Discharge status: Home / Self Care | Payer: PRIVATE HEALTH INSURANCE | Attending: Emergency Medicine | Admitting: Emergency Medicine

## 2020-06-15 ENCOUNTER — Emergency Department: Payer: PRIVATE HEALTH INSURANCE

## 2020-06-15 ENCOUNTER — Other Ambulatory Visit: Payer: Self-pay

## 2020-06-15 DIAGNOSIS — N939 Abnormal uterine and vaginal bleeding, unspecified: Secondary | ICD-10-CM

## 2020-06-15 DIAGNOSIS — Z79899 Other long term (current) drug therapy: Secondary | ICD-10-CM | POA: Diagnosis not present

## 2020-06-15 DIAGNOSIS — E039 Hypothyroidism, unspecified: Secondary | ICD-10-CM | POA: Insufficient documentation

## 2020-06-15 DIAGNOSIS — Z7982 Long term (current) use of aspirin: Secondary | ICD-10-CM | POA: Diagnosis not present

## 2020-06-15 LAB — CBC
HCT: 46 % (ref 36.0–46.0)
Hemoglobin: 16.4 g/dL — ABNORMAL HIGH (ref 12.0–15.0)
MCH: 31.1 pg (ref 26.0–34.0)
MCHC: 35.7 g/dL (ref 30.0–36.0)
MCV: 87.3 fL (ref 80.0–100.0)
Platelets: 313 10*3/uL (ref 150–400)
RBC: 5.27 MIL/uL — ABNORMAL HIGH (ref 3.87–5.11)
RDW: 11.9 % (ref 11.5–15.5)
WBC: 14.2 10*3/uL — ABNORMAL HIGH (ref 4.0–10.5)
nRBC: 0 % (ref 0.0–0.2)

## 2020-06-15 LAB — URINALYSIS, COMPLETE (UACMP) WITH MICROSCOPIC
Bacteria, UA: NONE SEEN
Bilirubin Urine: NEGATIVE
Glucose, UA: NEGATIVE mg/dL
Ketones, ur: NEGATIVE mg/dL
Leukocytes,Ua: NEGATIVE
Nitrite: NEGATIVE
Protein, ur: 30 mg/dL — AB
Specific Gravity, Urine: 1.008 (ref 1.005–1.030)
pH: 7 (ref 5.0–8.0)

## 2020-06-15 LAB — COMPREHENSIVE METABOLIC PANEL
ALT: 20 U/L (ref 0–44)
AST: 20 U/L (ref 15–41)
Albumin: 4.4 g/dL (ref 3.5–5.0)
Alkaline Phosphatase: 52 U/L (ref 38–126)
Anion gap: 11 (ref 5–15)
BUN: 9 mg/dL (ref 8–23)
CO2: 27 mmol/L (ref 22–32)
Calcium: 9.3 mg/dL (ref 8.9–10.3)
Chloride: 102 mmol/L (ref 98–111)
Creatinine, Ser: 0.8 mg/dL (ref 0.44–1.00)
GFR calc Af Amer: >60 mL/min (ref >60)
GFR calc non Af Amer: >60 mL/min (ref >60)
Glucose, Bld: 137 mg/dL — ABNORMAL HIGH (ref 70–99)
Potassium: 3.8 mmol/L (ref 3.5–5.1)
Sodium: 140 mmol/L (ref 135–145)
Total Bilirubin: 0.9 mg/dL (ref 0.3–1.2)
Total Protein: 7.2 g/dL (ref 6.5–8.1)

## 2020-06-15 LAB — TYPE AND SCREEN
ABO/RH(D): A NEG
Antibody Screen: NEGATIVE

## 2020-06-15 NOTE — ED Notes (Signed)
Pelvic cart in room, pt changing into hospital gown

## 2020-06-15 NOTE — ED Triage Notes (Addendum)
Pt states had hormone replacement therapy on Thursday. Noticed today when she uses the restroom "gushing" of vaginal bleeding. Denies much blood on pad. A&O, ambulatory. Denies dysuria. Went through menopause at age 64. Denies blood thinner use.

## 2020-06-15 NOTE — ED Provider Notes (Signed)
[facility] Emergency Department Provider Note   ____________________________________________   First MD Initiated Contact with Patient 06/15/20 1650     (approximate)  I have reviewed the triage vital signs and the nursing notes.   HISTORY  Chief Complaint Vaginal Bleeding   HPI Jamie Koch is a 64 y.o. female who is getting [facility] replacement [facility] [place].  She has pellets inserted in her hip.  Last set placed on Thursday.  Today which is Friday she noticed gushing of blood when she goes to the bathroom.  She saturated 1 pad so far today.  Did menopause at age 1 and has no blood thinners.  She is not lightheaded when she stands up.  Blood pressure lying is 167/91 sitting is 184/102 and standing is 143/114.  Pulse laying is 105 sitting is 114 and standing is 118.         Past Medical History:  Diagnosis Date   Arthritis    Chicken pox    Depression    Hay fever    High cholesterol    Thyroid dysfunction     Patient Active Problem List   Diagnosis Date Noted   Prediabetes 07/11/2019   Right foot injury, initial encounter 05/31/2017   Preventative health care 10/19/2016   Iron deficiency anemia 04/17/2016   Hypothyroidism 04/17/2016   Hyperlipidemia 04/17/2016   Osteoarthritis 04/17/2016    Past Surgical History:  Procedure Laterality Date   TONSILLECTOMY AND ADENOIDECTOMY  1964   TOTAL KNEE ARTHROPLASTY Right     Prior to Admission medications   Medication Sig Start Date End Date Taking? Authorizing Provider  acetaminophen (TYLENOL) 325 MG tablet Take 650 mg by mouth every 6 (six) hours as needed.    [provider]  ARMOUR THYROID 30 MG tablet TAKE 2 TABLETS BY MOUTH TWICE DAILY 09/27/18   Pleas Koch, NP  aspirin 81 MG tablet Take 81 mg by mouth daily.    [provider]  betamethasone dipropionate (DIPROLENE) 0.05 % cream Apply topically 2 (two) times daily. For  up to 10 days. 08/17/18   Elby Beck, FNP  cholecalciferol (VITAMIN D) 1000 units tablet Take 5,000 Units by mouth daily.    [provider]  iron polysaccharides (NIFEREX) 150 MG capsule Take 150 mg by mouth daily.    [provider]  simvastatin (ZOCOR) 40 MG tablet TAKE 1/2 TABLET(20 MG) BY MOUTH DAILY for cholesterol. 07/11/19   Pleas Koch, NP  Thiamine HCl (VITAMIN B-1) 250 MG tablet Take 250 mg by mouth daily.    [provider]    Allergies Patient has no known allergies.  History reviewed. No pertinent family history.  Social History Social History   Tobacco Use   Smoking status: Never Smoker   Smokeless tobacco: Never Used  Substance Use Topics   Alcohol use: No   Drug use: No    Review of Systems  Constitutional: No fever/chills Eyes: No visual changes. ENT: No sore throat. Cardiovascular: Denies chest pain. Respiratory: Denies shortness of breath. Gastrointestinal: No abdominal pain.  No nausea, no vomiting.  No diarrhea.  No constipation. Genitourinary: Negative for dysuria. Musculoskeletal: Negative for back pain. Skin: Negative for rash. Neurological: Negative for headaches, focal weakness   ____________________________________________   PHYSICAL EXAM:  VITAL SIGNS: ED Triage Vitals [06/15/20 1223]  Enc Vitals Group     BP (!) 183/119     Pulse Rate (!) 109  Resp 18     Temp 98.5 F (36.9 C)     Temp Source Oral     SpO2 95 %     Weight 200 lb (90.7 kg)     Height 5\' 4"  (1.626 m)     Head Circumference      Peak Flow      Pain Score 4     Pain Loc      Pain Edu?      Excl. in [place]?     Constitutional: Alert and oriented. Well appearing and in no acute distress. Eyes: Conjunctivae are normal. PER Head: Atraumatic. Nose: No congestion/rhinnorhea. Mouth/Throat: Mucous membranes are moist.  Oropharynx non-erythematous. Neck: No stridor. Cardiovascular: Normal rate, regular rhythm. Grossly normal  heart sounds.  Good peripheral circulation. Respiratory: Normal respiratory effort.  No retractions. Lungs CTAB. Gastrointestinal: Soft and nontender. No distention. No abdominal bruits. N Genitourinary: Normal perineum and vagina.  There is some dark blood with mucus in the vagina to the Fox swabs/large Q-tips are able to remove the blood.  There is no active bleeding currently. Musculoskeletal: No lower extremity tenderness nor edema.  Neurologic:  Normal speech and language. No gross focal neurologic deficits are appreciated. No gait instability. Skin:  Skin is warm, dry and intact. No rash noted.   ____________________________________________   LABS (all labs ordered are listed, but only abnormal results are displayed)  Labs Reviewed  COMPREHENSIVE METABOLIC PANEL - Abnormal; Notable for the following components:      Result Value   Glucose, Bld 137 (*)    All other components within normal limits  CBC - Abnormal; Notable for the following components:   WBC 14.2 (*)    RBC 5.27 (*)    Hemoglobin 16.4 (*)    All other components within normal limits  URINALYSIS, COMPLETE (UACMP) WITH MICROSCOPIC - Abnormal; Notable for the following components:   Color, Urine YELLOW (*)    APPearance CLEAR (*)    Hgb urine dipstick LARGE (*)    Protein, ur 30 (*)    All other components within normal limits  TYPE AND SCREEN   ____________________________________________  EKG  _______________________________________  RADIOLOGY  ED MD interpretation:    Official radiology report(s): US PELVIC COMPLETE WITH TRANSVAGINAL  Result Date: 06/15/2020 CLINICAL DATA:  Bleeding EXAM: TRANSABDOMINAL AND TRANSVAGINAL ULTRASOUND OF PELVIS TECHNIQUE: Both transabdominal and transvaginal ultrasound examinations of the pelvis were performed. Transabdominal technique was performed for global imaging of the pelvis including uterus, ovaries, adnexal regions, and pelvic cul-de-sac. It was necessary to  proceed with endovaginal exam following the transabdominal exam to visualize the ovaries. COMPARISON:  None FINDINGS: Uterus Measurements: 12.1 x 6.6 x 8.2 cm = volume: 344 mL. There are 2 hypoechoic lesions seen within the posterior uterine fundus, likely a intrauterine fibroids. Within the posterior left fundus the lesion measures 2.5 x 2.4 x 2.7 cm. Within the posterior right fundus the lesion measures 3.2 x 2.5 x 2.7 cm. Endometrium Thickness: 4 mm. Within the lower endometrial canal there is a hypoechoic area which measures 2.0 x 0.8 x 1.6 cm without internal vascularity. Right ovary Nonvisualized Left ovary Nonvisualized Other findings No abnormal free fluid. IMPRESSION: Two posterior fundal intrauterine fibroids as described above. Small hypoechoic area in the lower endometrial canal measuring 2.0 x 0.8 x 1.6 cm which could represent endometrial polyp. A focal endometrial lesion is suspected. Consider sonohysterogram for further evaluation, prior to hysteroscopy or endometrial biopsy. Electronically Signed   By: Prudencio Pair  M.D.   On: 06/15/2020 19:03    ____________________________________________   PROCEDURES  Procedure(s) performed (including Critical Care):  Procedures   ____________________________________________   INITIAL IMPRESSION / ASSESSMENT AND PLAN / ED COURSE Patient's ultrasound shows 2 fibroids.  This may be part of the cause of her bleeding.  There also may be an endometrial polyp.  I will have the patient follow-up either with [facility] that is prescribing her hormone replacement or with her primary care doctor with whom she has an appointment on Monday or with Dr. Ouida Sills who I discussed her care with.  He is OB/GYN.  Patient will return if she has bleeding enough to soak a pad an hour or gets weak or lightheaded.  She understands that we need to determine the cause of bleeding and make sure there is no endometrial cancer involved.  When I checked her heart rate 7:00  it was 106.  Patient feels well is not weak or lightheaded.  We will plan on letting her go.              ____________________________________________   FINAL CLINICAL IMPRESSION(S) / ED DIAGNOSES  Final diagnoses:  Vaginal bleeding     ED Discharge Orders    None       Note:  This document was prepared using Dragon voice recognition software and may include unintentional dictation errors.    Nena Polio, MD 06/15/20 [phone]

## 2020-06-15 NOTE — Discharge Instructions (Signed)
Please follow-up either with [facility] in [facility] or your primary care doctor or Dr. Ouida Sills OB/GYN.  We need to find the cause of your bleeding and stop it.  Please also return here for heavier bleeding ,soaking a pad an hour or if you get weak or lightheaded.

## 2020-06-17 NOTE — Telephone Encounter (Signed)
[facility] - Client TELEPHONE ADVICE RECORD [facility] Patient Name: Jamie Koch Gender: Female DOB: 09/30/1956 Age: 64 Y 39 M Return Phone Number: [phone] (Primary), [phone] (Secondary) Address[place] [address] Client [facility] - Client Client Site [facility] Physician Alma Friendly - NP Contact Type Call Who Is Calling Patient / Member / Family / Caregiver Call Type Triage / Clinical Relationship To Patient Self Return Phone Number [phone] (Primary) Chief Complaint Vaginal Bleeding Reason for Call Symptomatic / Request for [place] states she is having vaginal bleeding after having her Hormone replacement pellet therapy on Thursday. Translation No Nurse Assessment Nurse: Luvenia Heller, RN, Adriana Date/Time (Eastern Time): 06/15/2020 12:00:28 PM Confirm and document reason for call. If symptomatic, describe symptoms. ---Caller states she is having vaginal bleeding began this morning after having her Hormone replacement pellet therapy on Thursday. Caller c/o back pain, chills. Clear discharge. Pad in place. Has the patient had close contact with a person known or suspected to have the novel coronavirus illness OR traveled / lives in area with major community spread (including international travel) in the last 14 days from the onset of symptoms? * If Asymptomatic, screen for exposure and travel within the last 14 days. ---No Does the patient have any new or worsening symptoms? ---Yes Will a triage be completed? ---Yes Related visit to physician within the last 2 weeks? ---Yes Does the PT have any chronic conditions? (i.e. diabetes, asthma, this includes High risk factors for pregnancy, etc.) ---Yes List chronic conditions. ---Hormone replacement therapy, hypothyroid Is this a behavioral health or substance abuse call?  ---No Guidelines Guideline Title Affirmed Question Affirmed Notes Nurse Date/Time Eilene Ghazi Time) Vaginal Bleeding - Postmenopausal Postmenopausal vaginal bleeding Luvenia Heller, RN, Adriana 06/15/2020 12:02:51 PM Disp. Time (Eastern Time) Disposition Final UserPLEASE NOTE: All timestamps contained within this report are represented as [place] Standard Time. CONFIDENTIALTY NOTICE: This fax transmission is intended only for the addressee. It contains information that is legally privileged, confidential or otherwise protected from use or disclosure. If you are not the intended recipient, you are strictly prohibited from reviewing, disclosing, copying using or disseminating any of this information or taking any action in reliance on or regarding this information. If you have received this fax in error, please notify us immediately by telephone so that we can arrange for its return to [place]. Phone: [phone], Toll-Free: [phone], Fax: [phone] Page: 2 of 2 Call Id: [id number] 06/15/2020 12:04:41 PM See PCP within 2 Weeks Yes Luvenia Heller, RN, Darylene Price Disagree/Comply Comply Caller Understands Yes PreDisposition Go to ED Care Advice Given Per Guideline SEE PCP WITHIN 2 WEEKS: * You need to be seen for this ongoing problem within the next 2 weeks. CALL BACK IF: * Bleeding increases * You become worse. CARE ADVICE given per Vaginal Bleeding, Postmenopausal (Adult) guideline. * Severe abdominal pain or dizziness occurs

## 2020-07-12 ENCOUNTER — Other Ambulatory Visit: Payer: Self-pay | Admitting: Primary Care

## 2020-07-12 DIAGNOSIS — E785 Hyperlipidemia, unspecified: Secondary | ICD-10-CM

## 2020-07-16 ENCOUNTER — Other Ambulatory Visit: Payer: Self-pay | Admitting: Obstetrics and Gynecology

## 2020-07-16 NOTE — H&P (Signed)
Jamie Koch is a 64 y.o. female here for fractional D+C and myosure resection She get estrogen and testosterone pellets q months . Oral100 mg progesterone currently . EMBX 8/10 /21 :  Comment: Specimen A-Endometrial Biopsy: SMALL STRIPS AND FRAGMENTS  OF DISORDERED PROLIFERATIVE PHASE ENDOMETRIUM. REACTIVE  TUBAL METAPLASIA. NEGATIVE FOR MALIGNANCY.  Past Medical History:  has a past medical history of Hypertension and Thyroid disease.  Past Surgical History:  has a past surgical history that includes Replacement total knee and partial knee surgery. Family History: family history is not on file. Social History:  reports that she has never smoked. She has never used smokeless tobacco. She reports previous alcohol use. OB/GYN History:          OB History    Gravida  1   Para  1   Term      Preterm      AB      Living  1     SAB      TAB      Ectopic      Molar      Multiple      Live Births  1          Allergies: has No Known Allergies. Medications:  Current Outpatient Medications:  .  ARMOUR THYROID 60 mg tablet, TAKE 1 TABLET BY MOUTH ONCE DAILY ON AN EMPTY STOMACH, Disp: , Rfl:  .  cholecalciferol (VITAMIN D3) 1000 unit tablet, Take by mouth, Disp: , Rfl:  .  progesterone (PROMETRIUM) 100 MG capsule, Take 200 mg by mouth once daily, Disp: , Rfl:  .  simvastatin (ZOCOR) 40 MG tablet, , Disp: , Rfl:  .  spironolactone (ALDACTONE) 25 MG tablet, Take 25 mg by mouth once daily, Disp: , Rfl:  .  UNABLE TO FIND, Estradiol 6 mg pellet, Disp: , Rfl:  .  UNABLE TO FIND, Testosterone pellet 100 mg, Disp: , Rfl:   Review of Systems: General:                      No fatigue or weight loss Eyes:                           No vision changes Ears:                            No hearing difficulty Respiratory:                No cough or shortness of breath Pulmonary:                  No asthma or shortness of breath Cardiovascular:           No chest pain,  palpitations, dyspnea on exertion Gastrointestinal:          No abdominal bloating, chronic diarrhea, constipations, masses, pain or hematochezia Genitourinary:             No hematuria, dysuria, abnormal vaginal discharge, pelvic pain, Menometrorrhagia, + PMB  Lymphatic:                   No swollen lymph nodes Musculoskeletal:         No muscle weakness Neurologic:                  No extremity weakness, syncope, seizure disorder Psychiatric:  No history of depression, delusions or suicidal/homicidal ideation    Exam:      Vitals:   07/10/20 0958  BP: 114/86    Body mass index is 35.15 kg/m.  WDWN whitefemale in NAD   Lungs: CTA  CV : RRR without murmur   Breast: exam done in sitting and lying position : No dimpling or retraction, no dominant mass, no spontaneous discharge, no axillary adenopathy Neck:  no thyromegaly Abdomen: soft , no mass, normal active bowel sounds,  non-tender, no rebound tenderness Pelvic: tanner stage 5 ,  External genitalia: vulva /labia no lesions Urethra: no prolapse Vagina: normal physiologic d/c Cervix: no lesions, no cervical motion tenderness   Uterus: normal size shape and contour, non-tender Adnexa: no mass,  non-tender   Rectovaginal:  SIS: Saline infusion sonohysterography: betadine prep to the cervix followed by placement of the HSG catheter into the endometrial canal . Sterile H2O is injected while performing a transvaginal u/s . Findings: Saline u/s   Uterus anteverted  Endometrium=8.57mm  Fibroids seen:1)Rt mid=1.8cm 2)Rt fundal=2.8cm 3)posterior=2.6cm 4)anterior=3cm 5)fundal=1.9cm 6)Lt fundal=2.6cm 7)posterior to endometrium=1.2cm  No free fluid seen  B/L adnexa's appear wnl  B/L ovaries not seen  Multiple endometrial polyps seen: 1)1.21 x 0.73 x 1.15cm 2)1.07 x 0.60 x 1.25cm 3)0.87 x 0.66 x 0.74cm  Encroaching endometrial fibroid=2.14 x 0.73cm        Impression:    The primary encounter diagnosis was PMB (postmenopausal bleeding). Diagnoses of Endometrial polyp and Fibroids, submucosal were also pertinent to this visit.    Plan:   Fx D+C  Hysteroscopy and resection of polyps with myosure  Benefits and risks to surgery: The proposed benefit of the surgery has been discussed with the patient. The possible risks include, but are not limited to: organ injury to the bowel , bladder, ureters, and major blood vessels and nerves. There is a possibility of additional surgeries resulting from these injuries. There is also the risk of blood transfusion and the need to receive blood products during or after the procedure which may rarely lead to HIV or Hepatitis C infection. There is a risk of developing a deep venous thrombosis or a pulmonary embolism . There is the possibility of wound infection and also anesthetic complications, even the rare possibility of death. The patient understands these risks and wishes to proceed. All questions have been answered and the consent has been signed. 60 min in patient care     Caroline Sauger, MD

## 2020-07-17 ENCOUNTER — Telehealth (INDEPENDENT_AMBULATORY_CARE_PROVIDER_SITE_OTHER): Payer: PRIVATE HEALTH INSURANCE | Admitting: Primary Care

## 2020-07-17 ENCOUNTER — Encounter: Payer: Self-pay | Admitting: Primary Care

## 2020-07-17 DIAGNOSIS — H5789 Other specified disorders of eye and adnexa: Secondary | ICD-10-CM | POA: Insufficient documentation

## 2020-07-17 DIAGNOSIS — N950 Postmenopausal bleeding: Secondary | ICD-10-CM

## 2020-07-17 HISTORY — DX: Other specified disorders of eye and adnexa: H57.89

## 2020-07-17 HISTORY — DX: Postmenopausal bleeding: N95.0

## 2020-07-17 NOTE — Progress Notes (Signed)
Subjective:    Patient ID: Jamie Koch, female    DOB: 02/05/1956, 64 y.o.   MRN: [id number]  HPI  Virtual Visit via Video Note  I connected with Jamie Koch on 07/17/20 at 12:00 PM EDT by a video enabled telemedicine application and verified that I am speaking with the correct person using two identifiers.  Location: Patient: Home Provider: Office Participants: Patient and myself   I discussed the limitations of evaluation and management by telemedicine and the availability of in person appointments. The patient expressed understanding and agreed to proceed.  History of Present Illness:  Jamie Koch is a 64 year old female with a history of hypothyroidism, iron deficiency anemia, prediabetes who presents today with a chief complaint of eye irritation.   Her symptoms are located to the right eye which began yesterday and include redness to the eye with clear drainage. Also with burning and itching. She's tried some "pink eye" drops with some improvement.     Observations/Objective:  Alert and oriented. Appears well, not sickly. Speaking in complete sentences. Very difficult to examine patient via video, slight redness noted to the lower inner eye lid.  No visible drainage.  Assessment and Plan:  Acute eye irritation with symptoms beginning 24 hours ago. By brief and difficulty exam patient via video, it doesn't appear that she has any drainage.  I asked patient to send me a picture of her eye and the eye drop bottle via My chart.  Suspect allergy involvement but consider blepharitis as a differential.   Follow Up Instructions:  Please send me a picture of your eye and the eye drop bottle as discussed.  It was a pleasure to see you today!    I discussed the assessment and treatment plan with the patient. The patient was provided an opportunity to ask questions and all were answered. The patient agreed with the plan and demonstrated an understanding of the  instructions.   The patient was advised to call back or seek an in-person evaluation if the symptoms worsen or if the condition fails to improve as anticipated.    Pleas Koch, NP    Review of Systems  Constitutional: Negative for fever.  HENT: Negative for congestion and postnasal drip.   Eyes: Positive for redness and itching. Negative for visual disturbance.  Respiratory: Negative for cough.        Past Medical History:  Diagnosis Date  . Arthritis   . Chicken pox   . Depression   . Hay fever   . High cholesterol   . Thyroid dysfunction      Social History   Socioeconomic History  . Marital status: Married    Spouse name: Not on file  . Number of children: Not on file  . Years of education: Not on file  . Highest education level: Not on file  Occupational History  . Not on file  Tobacco Use  . Smoking status: Never Smoker  . Smokeless tobacco: Never Used  Substance and Sexual Activity  . Alcohol use: No  . Drug use: No  . Sexual activity: Not on file  Other Topics Concern  . Not on file  Social History Narrative   Married.   1 child.   Retired. Worked in Insurance claims handler.    Enjoys reading, gardening.    Social Determinants of Health   Financial Resource Strain:   . Difficulty of Paying Living Expenses: Not on file  Food Insecurity:   . Worried About  Running Out of Food in the Last Year: Not on file  . Ran Out of Food in the Last Year: Not on file  Transportation Needs:   . Lack of Transportation (Medical): Not on file  . Lack of Transportation (Non-Medical): Not on file  Physical Activity:   . Days of Exercise per Week: Not on file  . Minutes of Exercise per Session: Not on file  Stress:   . Feeling of Stress : Not on file  Social Connections:   . Frequency of Communication with Friends and Family: Not on file  . Frequency of Social Gatherings with Friends and Family: Not on file  . Attends Religious Services: Not on file  . Active  Member of Clubs or Organizations: Not on file  . Attends Archivist Meetings: Not on file  . Marital Status: Not on file  Intimate Partner Violence:   . Fear of Current or Ex-Partner: Not on file  . Emotionally Abused: Not on file  . Physically Abused: Not on file  . Sexually Abused: Not on file    Past Surgical History:  Procedure Laterality Date  . TONSILLECTOMY AND ADENOIDECTOMY  1964  . TOTAL KNEE ARTHROPLASTY Right     No family history on file.  Allergies  Allergen Reactions  . Covid-19 (Mrna) Vaccine     Pfizer covid-19 vaccine. Caused left arm tingling, headaches, burning skin, vaginal bleeding.  . Lactose Intolerance (Gi)     Upset stomach   . Medroxyprogesterone Itching    Current Outpatient Medications on File Prior to Visit  Medication Sig Dispense Refill  . aspirin 81 MG tablet Take 81 mg by mouth daily.    . diphenhydrAMINE (BENADRYL) 25 MG tablet Take 25 mg by mouth at bedtime as needed for allergies or sleep.    Marland Kitchen EVENING PRIMROSE OIL PO Take 2,000 mg by mouth daily.    Marland Kitchen ibuprofen (ADVIL) 200 MG tablet Take 800 mg by mouth 2 (two) times daily.    Marland Kitchen OVER THE COUNTER MEDICATION Place 2 sprays into both nostrils daily as needed (allergies). Sovereign Silver nasal spray    . PRESCRIPTION MEDICATION Testosterone 100 mg pellets placed under the skin every 3 months    . PRESCRIPTION MEDICATION Estradiol 6 mg pellets placed under the skin every 3 months    . progesterone (PROMETRIUM) 100 MG capsule Take 100 mg by mouth every evening.    . simvastatin (ZOCOR) 40 MG tablet TAKE 1/2 TABLET(20 MG) BY MOUTH DAILY FOR CHOLESTEROL (Patient taking differently: Take 20 mg by mouth daily. ) 45 tablet 0  . spironolactone (ALDACTONE) 25 MG tablet Take 25 mg by mouth daily.    Marland Kitchen thyroid (ARMOUR) 60 MG tablet Take 60 mg by mouth daily before breakfast.    . Vitamin D-Vitamin K (VITAMIN K2-VITAMIN D3 PO) Take 3 tablets by mouth daily.     No current facility-administered  medications on file prior to visit.    Ht 5\' 4"  (1.626 m)   Wt 200 lb (90.7 kg)   BMI 34.33 kg/m    Objective:   Physical Exam Constitutional:      Appearance: She is not ill-appearing.  Eyes:     General:        Right eye: No discharge.        Left eye: No discharge.     Comments: Mild erythema to right lower inner eye lid  Pulmonary:     Effort: Pulmonary effort is normal.  Neurological:  Mental Status: She is alert.            Assessment & Plan:

## 2020-07-17 NOTE — Patient Instructions (Signed)
Please send me a picture of your eye and the eye drop bottle as discussed.  It was a pleasure to see you today!

## 2020-07-17 NOTE — Assessment & Plan Note (Signed)
Acute eye irritation with symptoms beginning 24 hours ago. By brief and difficulty exam patient via video, it doesn't appear that she has any drainage.  I asked patient to send me a picture of her eye and the eye drop bottle via My chart.  Suspect allergy involvement but consider blepharitis as a differential.

## 2020-07-23 ENCOUNTER — Encounter
Admission: RE | Admit: 2020-07-23 | Discharge: 2020-07-23 | Disposition: A | Discharge status: Home / Self Care | Payer: PRIVATE HEALTH INSURANCE | Source: Ambulatory Visit | Attending: Obstetrics and Gynecology | Admitting: Obstetrics and Gynecology

## 2020-07-23 ENCOUNTER — Other Ambulatory Visit: Payer: Self-pay

## 2020-07-23 HISTORY — DX: Hypothyroidism, unspecified: E03.9

## 2020-07-23 HISTORY — DX: Age-related osteoporosis without current pathological fracture: M81.0

## 2020-07-23 NOTE — Patient Instructions (Addendum)
Your procedure is scheduled on: Mon 9/20 Report to Day Surgery. To find out your arrival time please call [phone] between 1PM - 3PM on Friday 9/17.  Remember: Instructions that are not followed completely may result in serious medical risk,  up to and including death, or upon the discretion of your surgeon and anesthesiologist your  surgery may need to be rescheduled.     _X__ 1. Do not eat food after midnight the night before your procedure.                 No chewing gum or hard candies. You may drink clear liquids up to 2 hours                 before you are scheduled to arrive for your surgery- DO not drink clear                 liquids within 2 hours of the start of your surgery.                 Clear Liquids include:  water, apple juice without pulp, clear Gatorade, G2 or                  Gatorade Zero (avoid Red/Purple/Blue), Black Coffee or Tea (Do not add                 anything to coffee or tea). __x___2.   Complete the "Ensure Clear Pre-surgery Clear Carbohydrate Drink" provided to you, 2 hours before arrival.   __X__2.  On the morning of surgery brush your teeth with toothpaste and water, you                may rinse your mouth with mouthwash if you wish.  Do not swallow any toothpaste of mouthwash.     ___ 3.  No Alcohol for 24 hours before or after surgery.   ___ 4.  Do Not Smoke or use e-cigarettes For 24 Hours Prior to Your Surgery.                 Do not use any chewable tobacco products for at least 6 hours prior to                 Surgery.  ___  5.  Do not use any recreational drugs (marijuana, cocaine, heroin, ecstasy, MDMA or other)                For at least one week prior to your surgery.  Combination of these drugs with anesthesia                May have life threatening results.  ____  6.  Bring all medications with you on the day of surgery if instructed.   _x___  7.  Notify your doctor if there is any change in your medical  condition      (cold, fever, infections).     Do not wear jewelry, make-up, hairpins, clips or nail polish. Do not wear lotions, powders, or perfumes. You may wear deodorant. Do not shave 48 hours prior to surgery.  Do not bring valuables to the hospital.    [facility] is not responsible for any belongings or valuables.  Contacts, dentures or bridgework may not be worn into surgery. Leave your suitcase in the car. After surgery it may be brought to your room. For patients admitted to the hospital, discharge time is determined by your treatment  team.   Patients discharged the day of surgery will not be allowed to drive home.   Make arrangements for someone to be with you for the first 24 hours of your Same Day Discharge.    Please read over the following fact sheets that you were given:   Incentive spirometer    __x__ Take these medicines the morning of surgery with A SIP OF WATER:    1. thyroid (ARMOUR) 60 MG tablet  2.   3.   4.  5.  6.  ____ Fleet Enema (as directed)   __x__ Shower the night before and the morning of surgery with your regular soap products  ____ Use Benzoyl Peroxide Gel as instructed  ____ Use inhalers on the day of surgery  ____ Stop metformin 2 days prior to surgery    ____ Take 1/2 of usual insulin dose the night before surgery. No insulin the morning          of surgery.   ____ Stop Coumadin/Plavix/aspirin on   __x__ Stop Anti-inflammatories ibuprofen (ADVIL) 200 MG tablet, aleve and aspirin products today   __x__ Stop supplements until after surgery.  EVENING PRIMROSE OIL PO  ____ Bring C-Pap to the hospital.    If you have any questions regarding your pre-procedure instructions,  Please call Pre-admit Testing at [place]

## 2020-07-25 ENCOUNTER — Other Ambulatory Visit: Payer: Self-pay

## 2020-07-25 ENCOUNTER — Other Ambulatory Visit
Admission: RE | Admit: 2020-07-25 | Discharge: 2020-07-25 | Disposition: A | Discharge status: Home / Self Care | Payer: PRIVATE HEALTH INSURANCE | Source: Ambulatory Visit | Attending: Obstetrics and Gynecology | Admitting: Obstetrics and Gynecology

## 2020-07-25 DIAGNOSIS — Z01812 Encounter for preprocedural laboratory examination: Secondary | ICD-10-CM | POA: Diagnosis present

## 2020-07-25 DIAGNOSIS — Z20822 Contact with and (suspected) exposure to covid-19: Secondary | ICD-10-CM | POA: Insufficient documentation

## 2020-07-25 LAB — CBC
HCT: 45.5 % (ref 36.0–46.0)
Hemoglobin: 15.8 g/dL — ABNORMAL HIGH (ref 12.0–15.0)
MCH: 31.2 pg (ref 26.0–34.0)
MCHC: 34.7 g/dL (ref 30.0–36.0)
MCV: 89.9 fL (ref 80.0–100.0)
Platelets: 315 10*3/uL (ref 150–400)
RBC: 5.06 MIL/uL (ref 3.87–5.11)
RDW: 12 % (ref 11.5–15.5)
WBC: 7.4 10*3/uL (ref 4.0–10.5)
nRBC: 0 % (ref 0.0–0.2)

## 2020-07-25 LAB — BASIC METABOLIC PANEL
Anion gap: 10 (ref 5–15)
BUN: 13 mg/dL (ref 8–23)
CO2: 26 mmol/L (ref 22–32)
Calcium: 9.4 mg/dL (ref 8.9–10.3)
Chloride: 102 mmol/L (ref 98–111)
Creatinine, Ser: 0.73 mg/dL (ref 0.44–1.00)
GFR calc Af Amer: >60 mL/min (ref >60)
GFR calc non Af Amer: >60 mL/min (ref >60)
Glucose, Bld: 111 mg/dL — ABNORMAL HIGH (ref 70–99)
Potassium: 3.6 mmol/L (ref 3.5–5.1)
Sodium: 138 mmol/L (ref 135–145)

## 2020-07-26 LAB — TYPE AND SCREEN
ABO/RH(D): A NEG
Antibody Screen: NEGATIVE

## 2020-07-26 LAB — SARS CORONAVIRUS 2 (TAT 6-24 HRS): SARS Coronavirus 2: NEGATIVE

## 2020-07-29 ENCOUNTER — Encounter
Admission: RE | Disposition: A | Discharge status: Home / Self Care | Payer: Self-pay | Source: Home / Self Care | Attending: Obstetrics and Gynecology

## 2020-07-29 ENCOUNTER — Encounter: Payer: Self-pay | Admitting: Obstetrics and Gynecology

## 2020-07-29 ENCOUNTER — Ambulatory Visit: Payer: PRIVATE HEALTH INSURANCE | Admitting: Registered Nurse

## 2020-07-29 ENCOUNTER — Ambulatory Visit
Admission: RE | Admit: 2020-07-29 | Discharge: 2020-07-29 | Disposition: A | Discharge status: Home / Self Care | Payer: PRIVATE HEALTH INSURANCE | Attending: Obstetrics and Gynecology | Admitting: Obstetrics and Gynecology

## 2020-07-29 ENCOUNTER — Other Ambulatory Visit: Payer: Self-pay

## 2020-07-29 DIAGNOSIS — E079 Disorder of thyroid, unspecified: Secondary | ICD-10-CM | POA: Diagnosis not present

## 2020-07-29 DIAGNOSIS — Z79899 Other long term (current) drug therapy: Secondary | ICD-10-CM | POA: Diagnosis not present

## 2020-07-29 DIAGNOSIS — N840 Polyp of corpus uteri: Secondary | ICD-10-CM | POA: Diagnosis not present

## 2020-07-29 DIAGNOSIS — Z96659 Presence of unspecified artificial knee joint: Secondary | ICD-10-CM | POA: Insufficient documentation

## 2020-07-29 DIAGNOSIS — N950 Postmenopausal bleeding: Secondary | ICD-10-CM | POA: Insufficient documentation

## 2020-07-29 DIAGNOSIS — D250 Submucous leiomyoma of uterus: Secondary | ICD-10-CM | POA: Insufficient documentation

## 2020-07-29 DIAGNOSIS — I10 Essential (primary) hypertension: Secondary | ICD-10-CM | POA: Insufficient documentation

## 2020-07-29 HISTORY — PX: DILATATION & CURETTAGE/HYSTEROSCOPY WITH MYOSURE: SHX6511

## 2020-07-29 SURGERY — DILATATION & CURETTAGE/HYSTEROSCOPY WITH MYOSURE
Anesthesia: General

## 2020-07-29 MED ORDER — GABAPENTIN 300 MG PO CAPS: 300.0000 mg | ORAL_CAPSULE | ORAL | Status: AC

## 2020-07-29 MED ORDER — SEVOFLURANE IN SOLN
RESPIRATORY_TRACT | Status: AC
  Filled 2020-07-29: qty 250

## 2020-07-29 MED ORDER — ORAL CARE MOUTH RINSE: 15.0000 mL | Freq: Once | OROMUCOSAL | Status: AC

## 2020-07-29 MED ORDER — FAMOTIDINE 20 MG PO TABS: 20.0000 mg | ORAL_TABLET | Freq: Once | ORAL | Status: AC

## 2020-07-29 MED ORDER — LIDOCAINE HCL (CARDIAC) PF 100 MG/5ML IV SOSY
PREFILLED_SYRINGE | INTRAVENOUS | Status: DC | PRN
  Administered 2020-07-29: 80 mg via INTRAVENOUS

## 2020-07-29 MED ORDER — ACETAMINOPHEN 10 MG/ML IV SOLN
INTRAVENOUS | Status: AC
  Filled 2020-07-29: qty 100

## 2020-07-29 MED ORDER — FENTANYL CITRATE (PF) 100 MCG/2ML IJ SOLN: 25.0000 ug | INTRAMUSCULAR | Status: DC | PRN

## 2020-07-29 MED ORDER — FENTANYL CITRATE (PF) 100 MCG/2ML IJ SOLN
INTRAMUSCULAR | Status: DC | PRN
Start: 2020-07-29 — End: 2020-07-29
  Administered 2020-07-29 (×2): 25 ug via INTRAVENOUS
  Administered 2020-07-29: 50 ug via INTRAVENOUS

## 2020-07-29 MED ORDER — ONDANSETRON HCL 4 MG/2ML IJ SOLN
INTRAMUSCULAR | Status: DC | PRN
  Administered 2020-07-29: 4 mg via INTRAVENOUS

## 2020-07-29 MED ORDER — SILVER NITRATE-POT NITRATE 75-25 % EX MISC
CUTANEOUS | Status: DC | PRN
  Administered 2020-07-29: 4 via TOPICAL

## 2020-07-29 MED ORDER — ONDANSETRON HCL 4 MG/2ML IJ SOLN
INTRAMUSCULAR | Status: AC
  Filled 2020-07-29: qty 2

## 2020-07-29 MED ORDER — GABAPENTIN 300 MG PO CAPS
ORAL_CAPSULE | ORAL | Status: AC
  Administered 2020-07-29: 300 mg via ORAL
  Filled 2020-07-29: qty 1

## 2020-07-29 MED ORDER — LACTATED RINGERS IV SOLN: INTRAVENOUS | Status: DC

## 2020-07-29 MED ORDER — CHLORHEXIDINE GLUCONATE 0.12 % MT SOLN
OROMUCOSAL | Status: AC
  Administered 2020-07-29: 15 mL via OROMUCOSAL
  Filled 2020-07-29: qty 15

## 2020-07-29 MED ORDER — DEXAMETHASONE SODIUM PHOSPHATE 10 MG/ML IJ SOLN
INTRAMUSCULAR | Status: DC | PRN
  Administered 2020-07-29: 10 mg via INTRAVENOUS

## 2020-07-29 MED ORDER — FAMOTIDINE 20 MG PO TABS
ORAL_TABLET | ORAL | Status: AC
  Administered 2020-07-29: 20 mg via ORAL
  Filled 2020-07-29: qty 1

## 2020-07-29 MED ORDER — ACETAMINOPHEN 500 MG PO TABS
ORAL_TABLET | ORAL | Status: AC
  Administered 2020-07-29: 1000 mg via ORAL
  Filled 2020-07-29: qty 2

## 2020-07-29 MED ORDER — FENTANYL CITRATE (PF) 100 MCG/2ML IJ SOLN
INTRAMUSCULAR | Status: AC
  Filled 2020-07-29: qty 2

## 2020-07-29 MED ORDER — MIDAZOLAM HCL 2 MG/2ML IJ SOLN
INTRAMUSCULAR | Status: AC
  Filled 2020-07-29: qty 2

## 2020-07-29 MED ORDER — EPHEDRINE 5 MG/ML INJ
INTRAVENOUS | Status: AC
  Filled 2020-07-29: qty 10

## 2020-07-29 MED ORDER — ACETAMINOPHEN 10 MG/ML IV SOLN
INTRAVENOUS | Status: DC | PRN
  Administered 2020-07-29: 1000 mg via INTRAVENOUS

## 2020-07-29 MED ORDER — PROPOFOL 10 MG/ML IV BOLUS
INTRAVENOUS | Status: DC | PRN
  Administered 2020-07-29: 150 mg via INTRAVENOUS
  Administered 2020-07-29: 50 mg via INTRAVENOUS

## 2020-07-29 MED ORDER — KETOROLAC TROMETHAMINE 30 MG/ML IJ SOLN: 30.0000 mg | Freq: Once | INTRAMUSCULAR | Status: DC

## 2020-07-29 MED ORDER — CEFAZOLIN SODIUM-DEXTROSE 2-4 GM/100ML-% IV SOLN
INTRAVENOUS | Status: AC
  Filled 2020-07-29: qty 100

## 2020-07-29 MED ORDER — ACETAMINOPHEN 500 MG PO TABS: 1000.0000 mg | ORAL_TABLET | ORAL | Status: AC

## 2020-07-29 MED ORDER — CEFAZOLIN SODIUM-DEXTROSE 2-4 GM/100ML-% IV SOLN
2.0000 g | Freq: Once | INTRAVENOUS | Status: AC
  Administered 2020-07-29: 2 g via INTRAVENOUS

## 2020-07-29 MED ORDER — MIDAZOLAM HCL 2 MG/2ML IJ SOLN
INTRAMUSCULAR | Status: DC | PRN
  Administered 2020-07-29: 2 mg via INTRAVENOUS

## 2020-07-29 MED ORDER — ONDANSETRON HCL 4 MG/2ML IJ SOLN: 4.0000 mg | Freq: Once | INTRAMUSCULAR | Status: DC | PRN

## 2020-07-29 MED ORDER — DEXAMETHASONE SODIUM PHOSPHATE 10 MG/ML IJ SOLN
INTRAMUSCULAR | Status: AC
  Filled 2020-07-29: qty 1

## 2020-07-29 MED ORDER — EPHEDRINE SULFATE 50 MG/ML IJ SOLN
INTRAMUSCULAR | Status: DC | PRN
  Administered 2020-07-29: 5 mg via INTRAVENOUS

## 2020-07-29 MED ORDER — CHLORHEXIDINE GLUCONATE 0.12 % MT SOLN: 15.0000 mL | Freq: Once | OROMUCOSAL | Status: AC

## 2020-07-29 SURGICAL SUPPLY — 24 items
BAG INFUSER PRESSURE 100CC (MISCELLANEOUS) ×2 IMPLANT
CANISTER SUCT 3000ML PPV (MISCELLANEOUS) ×2 IMPLANT
CATH ROBINSON RED A/P 16FR (CATHETERS) ×2 IMPLANT
COVER WAND RF STERILE (DRAPES) IMPLANT
DEVICE MYOSURE LITE (MISCELLANEOUS) IMPLANT
DEVICE MYOSURE REACH (MISCELLANEOUS) ×2 IMPLANT
GLOVE SURG SYN 8.0 (GLOVE) ×2 IMPLANT
GOWN STRL REUS W/ TWL LRG LVL3 (GOWN DISPOSABLE) ×1 IMPLANT
GOWN STRL REUS W/ TWL XL LVL3 (GOWN DISPOSABLE) ×1 IMPLANT
GOWN STRL REUS W/TWL LRG LVL3 (GOWN DISPOSABLE) ×2
GOWN STRL REUS W/TWL XL LVL3 (GOWN DISPOSABLE) ×2
IV NS 1000ML (IV SOLUTION) ×2
IV NS 1000ML BAXH (IV SOLUTION) ×1 IMPLANT
KIT PROCEDURE FLUENT (KITS) ×2 IMPLANT
KIT TURNOVER CYSTO (KITS) ×2 IMPLANT
PACK DNC HYST (MISCELLANEOUS) ×2 IMPLANT
PAD OB MATERNITY 4.3X12.25 (PERSONAL CARE ITEMS) ×2 IMPLANT
PAD PREP 24X41 OB/GYN DISP (PERSONAL CARE ITEMS) ×2 IMPLANT
SEAL ROD LENS SCOPE MYOSURE (ABLATOR) ×2 IMPLANT
SOL .9 NS 3000ML IRR  AL (IV SOLUTION) ×1
SOL .9 NS 3000ML IRR AL (IV SOLUTION) ×1
SOL .9 NS 3000ML IRR UROMATIC (IV SOLUTION) ×1 IMPLANT
TOWEL OR 17X26 4PK STRL BLUE (TOWEL DISPOSABLE) ×2 IMPLANT
TUBING CONNECTING 10 (TUBING) IMPLANT

## 2020-07-29 NOTE — Transfer of Care (Signed)
Immediate Anesthesia Transfer of Care Note  Patient: Jamie Koch  Procedure(s) Performed: FRACTIONAL DILATATION & CURETTAGE/HYSTEROSCOPY WITH MYOSURE RESECTION OF ENDOMETRIAL POLYP (N/A )  Patient Location: PACU  Anesthesia Type:General  Level of Consciousness: awake and drowsy  Airway & Oxygen Therapy: Patient Spontanous Breathing and Patient connected to face mask oxygen  Post-op Assessment: Report given to RN and Post -op Vital signs reviewed and stable  Post vital signs: Reviewed and stable  Last Vitals:  Vitals Value Taken Time  BP 163/91 07/29/20 1705  Temp 36.1 C 07/29/20 1705  Pulse 86 07/29/20 1710  Resp 20 07/29/20 1710  SpO2 100 % 07/29/20 1710  Vitals shown include unvalidated device data.  Last Pain:  Vitals:   07/29/20 1705  TempSrc:   PainSc: 0-No pain         Complications: No complications documented.

## 2020-07-29 NOTE — Anesthesia Preprocedure Evaluation (Signed)
Anesthesia Evaluation  Patient identified by MRN, date of birth, ID band Patient awake    Reviewed: Allergy & Precautions, H&P , NPO status , Patient's Chart, lab work & pertinent test results, reviewed documented beta blocker date and time   Airway Mallampati: II  TM Distance: >3 FB Neck ROM: full    Dental  (+) Teeth Intact   Pulmonary neg pulmonary ROS,    Pulmonary exam normal        Cardiovascular Exercise Tolerance: Poor negative cardio ROS Normal cardiovascular exam Rhythm:regular Rate:Normal     Neuro/Psych PSYCHIATRIC DISORDERS Depression negative neurological ROS     GI/Hepatic negative GI ROS, Neg liver ROS,   Endo/Other  negative endocrine ROSHypothyroidism   Renal/GU negative Renal ROS  negative genitourinary   Musculoskeletal   Abdominal   Peds  Hematology negative hematology ROS (+) Blood dyscrasia, anemia ,   Anesthesia Other Findings Past Medical History: No date: Arthritis No date: Chicken pox No date: Depression No date: Hay fever No date: High cholesterol No date: Hypothyroidism No date: Osteoporosis No date: Thyroid dysfunction Past Surgical History: No date: PARTIAL KNEE ARTHROPLASTY; Left 1964: TONSILLECTOMY AND ADENOIDECTOMY No date: TOTAL KNEE ARTHROPLASTY; Right   Reproductive/Obstetrics negative OB ROS                             Anesthesia Physical Anesthesia Plan  ASA: II  Anesthesia Plan: General LMA   Post-op Pain Management:    Induction:   PONV Risk Score and Plan:   Airway Management Planned:   Additional Equipment:   Intra-op Plan:   Post-operative Plan:   Informed Consent: I have reviewed the patients History and Physical, chart, labs and discussed the procedure including the risks, benefits and alternatives for the proposed anesthesia with the patient or authorized representative who has indicated his/her understanding and  acceptance.     Dental Advisory Given  Plan Discussed with: CRNA  Anesthesia Plan Comments:         Anesthesia Quick Evaluation

## 2020-07-29 NOTE — Anesthesia Procedure Notes (Addendum)
Procedure Name: LMA Insertion Date/Time: 07/29/2020 3:57 PM Performed by: Jerrye Noble, CRNA Pre-anesthesia Checklist: Patient identified, Emergency Drugs available, Suction available and Patient being monitored Patient Re-evaluated:Patient Re-evaluated prior to induction Oxygen Delivery Method: Circle system utilized Preoxygenation: Pre-oxygenation with 100% oxygen Induction Type: IV induction Ventilation: Mask ventilation without difficulty LMA: LMA inserted LMA Size: 3.5 Number of attempts: 1 Placement Confirmation: breath sounds checked- equal and bilateral Tube secured with: Tape Dental Injury: Teeth and Oropharynx as per pre-operative assessment

## 2020-07-29 NOTE — Progress Notes (Signed)
Pt ready for Fx D+C , H/s and myosure resection of endometrial tissue / polyps  Labs reviewed all questions answered . Proceed

## 2020-07-29 NOTE — Discharge Instructions (Signed)

## 2020-07-29 NOTE — Op Note (Signed)
[name], Jamie Koch MEDICAL RECORD [id number] ACCOUNT [id number] DATE OF BIRTH:[birth year] FACILITY: [facility] LOCATION: [facility] PHYSICIAN:Gennett Garcia Josefine Class, MD  OPERATIVE REPORT  DATE OF PROCEDURE:  07/29/2020  PREOPERATIVE DIAGNOSES: 1.  Postmenopausal bleeding. 2.  Submucosal fibroids and endometrial polyps.  POSTOPERATIVE DIAGNOSES:   1.  Postmenopausal bleeding.   2.  Submucosal fibroids and endometrial polyps.  PROCEDURE: 1.  Fractional dilation and curettage. 2.  Hysteroscopy. 3.  MyoSure resection of endometrial polyps and submucosal fibroid.  SURGEON:  Laverta Baltimore, MD  ANESTHESIA:  General endotracheal anesthesia.  INDICATIONS:  A 64 year old gravida 1, para 1 patient undergoing hormonal treatment with subcutaneous pellets, estrogen plus testosterone and oral Provera.  The patient developed postmenopausal bleeding and with workup, showed disordered proliferative  endometrium on an endometrial biopsy and several endometrial polyps and submucosal fibroids.  DESCRIPTION OF PROCEDURE:  After adequate general endotracheal anesthesia, the patient was placed in dorsal supine position with the legs in the candy cane stirrups.  Abdomen, perineum and vagina prepped and draped in normal sterile fashion.  Timeout was  performed.  Weighted speculum was placed in the posterior vaginal vault and the anterior cervix was grasped with a single-tooth tenaculum.  An endocervical curettage was performed, followed by uterine sounding to 10 cm.  Cervix was dilated to #15 Hanks  dilator and the hysteroscope was then advanced into the endometrial cavity.  Several polyps and fibroids were noted.  The MyoSure Reach was then brought up to the operative field and endometrial polyps and submucosal fibroids were dissected.  Pictures  were taken.  The MyoSure hysteroscope was then removed.  Good hemostasis was noted.  Silver nitrate was applied to the tenaculum site.  COMPLICATIONS:   No complications.  ESTIMATED BLOOD LOSS:  25 mL.  INTRAOPERATIVE FLUIDS:  1000 mL,  net deficit normal saline 575 mL.  VN/NUANCE  D:07/29/2020 T:07/29/2020 JOB:[id number]

## 2020-07-29 NOTE — Brief Op Note (Signed)
07/29/2020  4:54 PM  PATIENT:  Jamie Koch  64 y.o. female  PRE-OPERATIVE DIAGNOSIS:  postmenopausal bleeding Endometrial polyps , submucosal fibroid POST-OPERATIVE DIAGNOSIS:  postmenopausal bleeding Same as above  PROCEDURE:  Procedure(s): FRACTIONAL DILATATION & CURETTAGE/HYSTEROSCOPY WITH MYOSURE RESECTION OF ENDOMETRIAL POLYP (N/A) And submucosal fibroid  SURGEON:  Surgeon(s) and Role:    * Cheveyo Virginia, Gwen Her, MD - Primary  PHYSICIAN ASSISTANT: cst  ASSISTANTS: none   ANESTHESIA:   spinal  EBL:  25 mL  IOF 1000 cc  net deficit 575 cc  BLOOD ADMINISTERED:none  DRAINS: none   LOCAL MEDICATIONS USED:  NONE  SPECIMEN:  Source of Specimen:  ecc , endometrial curettings , endometrial polyps and fibroid  DISPOSITION OF SPECIMEN:  PATHOLOGY  COUNTS:  YES  TOURNIQUET:  * No tourniquets in log *  DICTATION: .Other Dictation: Dictation Number verbal  PLAN OF CARE: Discharge to home after PACU  PATIENT DISPOSITION:  PACU - hemodynamically stable.   Delay start of Pharmacological VTE agent (>24hrs) due to surgical blood loss or risk of bleeding: not applicable

## 2020-07-30 ENCOUNTER — Encounter: Payer: Self-pay | Admitting: Obstetrics and Gynecology

## 2020-07-31 LAB — SURGICAL PATHOLOGY

## 2020-07-31 NOTE — Anesthesia Postprocedure Evaluation (Signed)
Anesthesia Post Note  Patient: Jamie Koch  Procedure(s) Performed: FRACTIONAL DILATATION & CURETTAGE/HYSTEROSCOPY WITH MYOSURE RESECTION OF ENDOMETRIAL POLYP (N/A )  Patient location during evaluation: PACU Anesthesia Type: General Level of consciousness: awake and alert Pain management: pain level controlled Vital Signs Assessment: post-procedure vital signs reviewed and stable Respiratory status: spontaneous breathing, nonlabored ventilation, respiratory function stable and patient connected to nasal cannula oxygen Cardiovascular status: blood pressure returned to baseline and stable Postop Assessment: no apparent nausea or vomiting Anesthetic complications: no   No complications documented.   Last Vitals:  Vitals:   07/29/20 1735 07/29/20 1759  BP: (!) 178/99 (!) 171/98  Pulse: 85 89  Resp: 16 17  Temp: 36.6 C 36.6 C  SpO2: 97% 97%    Last Pain:  Vitals:   07/29/20 1759  TempSrc:   PainSc: 0-No pain                 Molli Barrows

## 2020-09-10 ENCOUNTER — Other Ambulatory Visit: Payer: Self-pay | Admitting: Primary Care

## 2020-09-10 DIAGNOSIS — E785 Hyperlipidemia, unspecified: Secondary | ICD-10-CM

## 2020-09-11 ENCOUNTER — Other Ambulatory Visit: Payer: Self-pay | Admitting: Primary Care

## 2020-09-11 DIAGNOSIS — E785 Hyperlipidemia, unspecified: Secondary | ICD-10-CM

## 2020-09-14 ENCOUNTER — Other Ambulatory Visit: Payer: Self-pay

## 2020-09-14 DIAGNOSIS — E785 Hyperlipidemia, unspecified: Secondary | ICD-10-CM

## 2020-09-25 ENCOUNTER — Other Ambulatory Visit: Payer: Self-pay

## 2020-09-25 ENCOUNTER — Encounter: Payer: Self-pay | Admitting: Primary Care

## 2020-09-25 ENCOUNTER — Ambulatory Visit (INDEPENDENT_AMBULATORY_CARE_PROVIDER_SITE_OTHER): Payer: Medicare Other | Admitting: Primary Care

## 2020-09-25 ENCOUNTER — Other Ambulatory Visit: Payer: Self-pay | Admitting: Primary Care

## 2020-09-25 VITALS — BP 136/82 | HR 100 | Temp 97.0°F | Ht 64.0 in | Wt 216.0 lb

## 2020-09-25 DIAGNOSIS — Z0000 Encounter for general adult medical examination without abnormal findings: Secondary | ICD-10-CM | POA: Diagnosis not present

## 2020-09-25 DIAGNOSIS — D509 Iron deficiency anemia, unspecified: Secondary | ICD-10-CM

## 2020-09-25 DIAGNOSIS — E782 Mixed hyperlipidemia: Secondary | ICD-10-CM

## 2020-09-25 DIAGNOSIS — E785 Hyperlipidemia, unspecified: Secondary | ICD-10-CM

## 2020-09-25 DIAGNOSIS — E039 Hypothyroidism, unspecified: Secondary | ICD-10-CM

## 2020-09-25 DIAGNOSIS — N950 Postmenopausal bleeding: Secondary | ICD-10-CM

## 2020-09-25 DIAGNOSIS — J309 Allergic rhinitis, unspecified: Secondary | ICD-10-CM

## 2020-09-25 DIAGNOSIS — E559 Vitamin D deficiency, unspecified: Secondary | ICD-10-CM

## 2020-09-25 DIAGNOSIS — M1990 Unspecified osteoarthritis, unspecified site: Secondary | ICD-10-CM | POA: Diagnosis not present

## 2020-09-25 DIAGNOSIS — R7303 Prediabetes: Secondary | ICD-10-CM

## 2020-09-25 DIAGNOSIS — Z23 Encounter for immunization: Secondary | ICD-10-CM | POA: Diagnosis not present

## 2020-09-25 DIAGNOSIS — Z1211 Encounter for screening for malignant neoplasm of colon: Secondary | ICD-10-CM

## 2020-09-25 DIAGNOSIS — Z114 Encounter for screening for human immunodeficiency virus [HIV]: Secondary | ICD-10-CM

## 2020-09-25 DIAGNOSIS — Z1231 Encounter for screening mammogram for malignant neoplasm of breast: Secondary | ICD-10-CM

## 2020-09-25 DIAGNOSIS — E2839 Other primary ovarian failure: Secondary | ICD-10-CM

## 2020-09-25 LAB — VITAMIN D 25 HYDROXY (VIT D DEFICIENCY, FRACTURES): VITD: 25.57 ng/mL — ABNORMAL LOW (ref 30.00–100.00)

## 2020-09-25 LAB — LIPID PANEL
Cholesterol: 157 mg/dL (ref 0–200)
HDL: 35.8 mg/dL — ABNORMAL LOW (ref >39.00)
NonHDL: 121.02
Total CHOL/HDL Ratio: 4
Triglycerides: 278 mg/dL — ABNORMAL HIGH (ref 0.0–149.0)
VLDL: 55.6 mg/dL — ABNORMAL HIGH (ref 0.0–40.0)

## 2020-09-25 LAB — LDL CHOLESTEROL, DIRECT: Direct LDL: 102 mg/dL

## 2020-09-25 LAB — HEMOGLOBIN A1C: Hgb A1c MFr Bld: 6 % (ref 4.6–6.5)

## 2020-09-25 LAB — TSH: TSH: 6.07 u[IU]/mL — ABNORMAL HIGH (ref 0.35–4.50)

## 2020-09-25 MED ORDER — ZOSTER VAC RECOMB ADJUVANTED 50 MCG/0.5ML IM SUSR: 0.5000 mL | Freq: Once | INTRAMUSCULAR | 1 refills | Status: AC

## 2020-09-25 MED ORDER — MONTELUKAST SODIUM 10 MG PO TABS: 10.0000 mg | ORAL_TABLET | Freq: Every day | ORAL | 3 refills | Status: DC

## 2020-09-25 NOTE — Assessment & Plan Note (Signed)
Chronic and stable on PRN Tylenol.  Continue same.

## 2020-09-25 NOTE — Addendum Note (Signed)
Addended by: Francella Solian on: 09/25/2020 09:45 AM   Modules accepted: Orders

## 2020-09-25 NOTE — Assessment & Plan Note (Signed)
Discussed the importance of a healthy diet and regular exercise in order for weight loss, and to reduce the risk of any potential medical problems.  Repeat A1C pending. 

## 2020-09-25 NOTE — Assessment & Plan Note (Signed)
Due for Prevnar, also requesting Shingrix vaccination. Bone density scan and mammogram due, ordered and pending. Colon cancer screening due, she opts for Cologuard. Encouraged a healthy diet and regular exercise. She does have advanced directives. All recommendations provided at end of visit.   I have personally reviewed and have noted: 1. The patient's medical and social history 2. Their use of alcohol, tobacco or illicit drugs 3. Their current medications and supplements 4. The patient's functional ability including ADL's, fall risks, home safety risks and  hearing or visual impairment. 5. Diet and physical activities 6. Evidence for depression or mood disorder

## 2020-09-25 NOTE — Telephone Encounter (Signed)
Patient left a voicemail stating that she needs a refill on her Simvastatin

## 2020-09-25 NOTE — Assessment & Plan Note (Signed)
Compliant to simvastatin for which is takes 20 mg daily, cannot tolerate higher dose. Repeat lipid panel pending today. Continue simvastatin 20 mg.

## 2020-09-25 NOTE — Patient Instructions (Addendum)
Start montelukast (Singulair) 10 mg at bedtime for allergies.  Stop taking Benadryl. Continue Flonase.  You may add in Claritin during the day if needed for allergies.  Start exercising. You should be getting 150 minutes of moderate intensity exercise weekly.  It's important to improve your diet by reducing consumption of fast food, fried food, processed snack foods, sugary drinks. Increase consumption of fresh vegetables and fruits, whole grains, water.  Ensure you are drinking 64 ounces of water daily.  [facility] to schedule your mammogram and bone density scan.   Take the Shingles vaccine to the pharmacy when ready.   Complete the Cologuard Kit once received.   It was a pleasure to see you today!      Health Maintenance Due  Topic Date Due  . HIV Screening  Never done  . COLONOSCOPY sent cologuard order  Never done  . COVID-19 Vaccine (2 - Pfizer 2-dose series) reaction  03/20/2020  . MAMMOGRAM number given to call  05/09/2020  . INFLUENZA VACCINE updated in chart  06/09/2020  . PNA vac Low Risk Adult (1 of 2 - PCV13)  09/15/2020      Recommended follow up: No follow-ups on file.   You have an appointment scheduled for: []   2D Mammogram  [x]   3D Mammogram  []   Bone Density   Call for appointment    Your appointment will at the following location  [x]   [facility]  [place] [address]  [phone]  []   [place] [facility]   [address] [place], [place] [address]  [phone]  []   [facility]      [place], [place]         []   [facility]  [place], [place]   Make sure to wear two peace clothing  No lotions powders or deodorants the day of the appointment Make sure to bring picture ID and  insurance card.  Bring list of medications you are currently taking including any supplements.

## 2020-09-25 NOTE — Assessment & Plan Note (Signed)
Resolved with D&C per GYN in September 2021.

## 2020-09-25 NOTE — Assessment & Plan Note (Signed)
Secondary to post-menopausal bleeding. Resolved with D&C several months ago.

## 2020-09-25 NOTE — Progress Notes (Signed)
Patient ID: Jamie Koch, female   DOB: [birth year], 64 y.o.   MRN: [id number]  [name] is a 64 year old female who presents today for Welcome to Medicare Visit.  HPI:  Past Medical History:  Diagnosis Date  . Arthritis   . Chicken pox   . Depression   . Hay fever   . High cholesterol   . Hypothyroidism   . Osteoporosis   . Thyroid dysfunction     Current Outpatient Medications  Medication Sig Dispense Refill  . Acetaminophen (TYLENOL ARTHRITIS PAIN PO) Take 2 tablets by mouth.    . diphenhydrAMINE (BENADRYL) 25 MG tablet Take 25 mg by mouth at bedtime as needed for allergies or sleep.    Marland Kitchen EVENING PRIMROSE OIL PO Take 2,000 mg by mouth daily.    Marland Kitchen OVER THE COUNTER MEDICATION Place 2 sprays into both nostrils daily as needed (allergies). Sovereign Silver nasal spray    . PRESCRIPTION MEDICATION Testosterone 100 mg pellets placed under the skin every 3 months    . PRESCRIPTION MEDICATION Estradiol 6 mg pellets placed under the skin every 3 months    . progesterone (PROMETRIUM) 100 MG capsule Take 100 mg by mouth every evening.    . simvastatin (ZOCOR) 40 MG tablet TAKE 1/2 TABLET(20 MG) BY MOUTH DAILY FOR CHOLESTEROL (Patient taking differently: Take 20 mg by mouth daily. ) 45 tablet 0  . spironolactone (ALDACTONE) 25 MG tablet Take 25 mg by mouth daily.    Marland Kitchen thyroid (ARMOUR) 60 MG tablet Take 60 mg by mouth daily before breakfast.    . Vitamin D-Vitamin K (VITAMIN K2-VITAMIN D3 PO) Take 3 tablets by mouth daily.     No current facility-administered medications for this visit.    Allergies  Allergen Reactions  . Covid-19 (Mrna) Vaccine Other (See Comments)    Pfizer covid-19 vaccine. Caused left arm tingling, headaches, burning skin, vaginal bleeding.  . Lactose Intolerance (Gi)     Upset stomach   . Medroxyprogesterone Itching    History reviewed. No pertinent family history.  Social History   Socioeconomic History  . Marital status: Married    Spouse name: Not  on file  . Number of children: Not on file  . Years of education: Not on file  . Highest education level: Not on file  Occupational History  . Not on file  Tobacco Use  . Smoking status: Never Smoker  . Smokeless tobacco: Never Used  Vaping Use  . Vaping Use: Never used  Substance and Sexual Activity  . Alcohol use: No  . Drug use: No  . Sexual activity: Not on file  Other Topics Concern  . Not on file  Social History Narrative   Married.   1 child.   Retired. Worked in Insurance claims handler.    Enjoys reading, gardening.    Social Determinants of Health   Financial Resource Strain:   . Difficulty of Paying Living Expenses: Not on file  Food Insecurity:   . Worried About Charity fundraiser in the Last Year: Not on file  . Ran Out of Food in the Last Year: Not on file  Transportation Needs:   . Lack of Transportation (Medical): Not on file  . Lack of Transportation (Non-Medical): Not on file  Physical Activity:   . Days of Exercise per Week: Not on file  . Minutes of Exercise per Session: Not on file  Stress:   . Feeling of Stress : Not on file  Social Connections:   .  Frequency of Communication with Friends and Family: Not on file  . Frequency of Social Gatherings with Friends and Family: Not on file  . Attends Religious Services: Not on file  . Active Member of Clubs or Organizations: Not on file  . Attends Archivist Meetings: Not on file  . Marital Status: Not on file  Intimate Partner Violence:   . Fear of Current or Ex-Partner: Not on file  . Emotionally Abused: Not on file  . Physically Abused: Not on file  . Sexually Abused: Not on file    Hospitiliaztions: September 2021 for uterine polyps and fibroids  Health Maintenance:    Flu: Completed series   Tetanus: Completed in 2016  Pneumovax: Completed in 2011  Prevnar: Due today  Zostavax: Completed in 2017  Bone Density: Completed in 2019, due  Colonoscopy: Due, opts for Cologuard  Eye  Doctor: Due  Dental Exam: Completes regularly   Mammogram: Completed in 2019  Hep C Screening: Due    Providers: Alma Friendly, PCP; Dr. Marylen Ponto; GYN   I have personally reviewed and have noted: 1. The patient's medical and social history 2. Their use of alcohol, tobacco or illicit drugs 3. Their current medications and supplements 4. The patient's functional ability including ADL's, fall risks, home safety risks and  hearing or visual impairment. 5. Diet and physical activities 6. Evidence for depression or mood disorder  Subjective:   Review of Systems:   Constitutional: Denies fever, malaise, fatigue, headache or abrupt weight changes.  HEENT: Chronic rhinorrhea, post nasal, watery drip despite use of Benadryl and Flonase, chronic for four years. She has tried Zyrtec, Human resources officer, Claritin. Respiratory: Denies difficulty breathing, shortness of breath, cough or sputum production.   Cardiovascular: Denies chest pain, chest tightness, palpitations or swelling in the hands or feet.  Gastrointestinal: Denies abdominal pain, bloating, constipation, diarrhea or blood in the stool.  GU: Denies urgency, frequency, pain with urination, burning sensation, blood in urine, odor or discharge. Musculoskeletal: Denies decrease in range of motion, difficulty with gait. Skin: Denies redness, rashes, lesions or ulcercations.  Neurological: Denies dizziness, difficulty with memory, difficulty with speech or problems with balance and coordination.  Psychiatric: Denies concerns for anxiety or depression.   No other specific complaints in a complete review of systems (except as listed in HPI above).  Objective:  PE:   BP 136/82   Pulse 100   Temp (!) 97 F (36.1 C) (Temporal)   Ht 5\' 4"  (1.626 m)   Wt 216 lb (98 kg)   SpO2 97%   BMI 37.08 kg/m  Wt Readings from Last 3 Encounters:  09/25/20 216 lb (98 kg)  07/23/20 206 lb (93.4 kg)  07/17/20 200 lb (90.7 kg)    General: Appears  their stated age, well developed, well nourished in NAD. Skin: Warm, dry and intact. No rashes, lesions or ulcerations noted. HEENT: Head: normal shape and size; Eyes: sclera white, no icterus, conjunctiva pink, PERRLA and EOMs intact; Ears: Tm's gray and intact, normal light reflex; Nose: mucosa pink and moist, septum midline; Throat/Mouth: Teeth present, mucosa pink and moist, no exudate, lesions or ulcerations noted.  Neck: Normal range of motion. Neck supple, trachea midline. No massses, lumps or thyromegaly present.  Cardiovascular: Normal rate and rhythm. S1,S2 noted.  No murmur, rubs or gallops noted. No JVD or BLE edema. No carotid bruits noted. Pulmonary/Chest: Normal effort and positive vesicular breath sounds. No respiratory distress. No wheezes, rales or ronchi noted.  Abdomen: Soft and nontender.  Normal bowel sounds, no bruits noted. No distention or masses noted. Liver, spleen and kidneys non palpable. Musculoskeletal: Normal range of motion. No signs of joint swelling. No difficulty with gait.  Neurological: Alert and oriented. Cranial nerves II-XII intact. Coordination normal. +DTRs bilaterally. Psychiatric: Mood and affect normal. Behavior is normal. Judgment and thought content normal.   EKG: NSR with rate of 85, no PAC/PVC, st changes. Appears similar to ECG from 2019.  BMET    Component Value Date/Time   NA 138 07/25/2020 1251   K 3.6 07/25/2020 1251   CL 102 07/25/2020 1251   CO2 26 07/25/2020 1251   GLUCOSE 111 (H) 07/25/2020 1251   BUN 13 07/25/2020 1251   CREATININE 0.73 07/25/2020 1251   CALCIUM 9.4 07/25/2020 1251   GFRNONAA >60 07/25/2020 1251   GFRAA >60 07/25/2020 1251    Lipid Panel     Component Value Date/Time   CHOL 144 07/11/2019 1008   TRIG 207.0 (H) 07/11/2019 1008   HDL 37.50 (L) 07/11/2019 1008   CHOLHDL 4 07/11/2019 1008   VLDL 41.4 (H) 07/11/2019 1008   LDLCALC 66 01/07/2018 0753    CBC    Component Value Date/Time   WBC 7.4 07/25/2020  1251   RBC 5.06 07/25/2020 1251   HGB 15.8 (H) 07/25/2020 1251   HCT 45.5 07/25/2020 1251   PLT 315 07/25/2020 1251   MCV 89.9 07/25/2020 1251   MCH 31.2 07/25/2020 1251   MCHC 34.7 07/25/2020 1251   RDW 12.0 07/25/2020 1251    Hgb A1C Lab Results  Component Value Date   HGBA1C 5.9 07/11/2019      Assessment and Plan:   Medicare Annual Wellness Visit:  Diet: She endorses a fair diet.  Physical activity: Sedentary Depression/mood screen: Negative Hearing: Intact to whispered voice Visual acuity: Grossly normal, performs annual eye exam  ADLs: Capable Fall risk: None Home safety: Good Cognitive evaluation: Intact to orientation, naming, recall and repetition EOL planning: Adv directives, full code/ I agree  Preventative Medicine: Due for Prevnar, also requesting Shingrix vaccination. Bone density scan and mammogram due, ordered and pending. Colon cancer screening due, she opts for Cologuard. Encouraged a healthy diet and regular exercise. She does have advanced directives. All recommendations provided at end of visit.   Next appointment: One year

## 2020-09-25 NOTE — Telephone Encounter (Signed)
Noted, refill sent to pharmacy. 

## 2020-09-26 LAB — HIV ANTIBODY (ROUTINE TESTING W REFLEX): HIV 1&2 Ab, 4th Generation: NONREACTIVE

## 2021-05-02 ENCOUNTER — Other Ambulatory Visit: Payer: Self-pay

## 2021-05-02 ENCOUNTER — Ambulatory Visit: Payer: Medicare Other | Attending: Internal Medicine

## 2021-05-02 DIAGNOSIS — Z23 Encounter for immunization: Secondary | ICD-10-CM

## 2021-05-02 MED ORDER — JANSSEN COVID-19 VACCINE 0.5 ML IM SUSP
INTRAMUSCULAR | 0 refills | Status: DC
  Filled 2021-05-02: qty 0.5, 1d supply, fill #0

## 2021-06-26 ENCOUNTER — Telehealth: Payer: Medicare Other | Admitting: Physician Assistant

## 2021-06-26 DIAGNOSIS — U071 COVID-19: Secondary | ICD-10-CM

## 2021-06-26 MED ORDER — MOLNUPIRAVIR EUA 200MG CAPSULE: 4.0000 | ORAL_CAPSULE | Freq: Two times a day (BID) | ORAL | 0 refills | Status: AC

## 2021-06-26 MED ORDER — BENZONATATE 100 MG PO CAPS: 100.0000 mg | ORAL_CAPSULE | Freq: Three times a day (TID) | ORAL | 0 refills | Status: DC | PRN

## 2021-06-26 NOTE — Patient Instructions (Signed)
Hello Jamie Koch,  You are being placed in the home monitoring program for COVID-19 (commonly known as Coronavirus).  This is because you are suspected to have the virus or are known to have the virus.  If you are unsure which group you fall into call your clinic.    As part of this program, you'll answer a daily questionnaire in the MyChart mobile app. You'll receive a notification through the MyChart app when the questionnaire is available. When you log in to MyChart, you'll see the tasks in your To Do activity.       Clinicians will see any answers that are concerning and take appropriate steps.  If at any point you are having a medical emergency, call 911.  If otherwise concerned call your clinic instead of coming into the clinic or hospital.  To keep from spreading the disease you should: Stay home and limit contact with other people as much as possible.  Wash your hands frequently. Cover your coughs and sneezes with a tissue, and throw used tissues in the trash.   Clean and disinfect frequently touched surfaces and objects.    Take care of yourself by: Staying home Resting Drinking fluids Take fever-reducing medications (Tylenol/Acetaminophen and Ibuprofen)  For more information on the disease go to the Centers for Disease Control and Prevention website     COVID-19: What to Do if You Are Sick CDC has updated isolation and quarantine recommendations for the public, and is revising the CDC website to reflect these changes. These recommendations do not apply to healthcare personnel and do not supersede state, local, tribal, or territorial laws, rules, andregulations. If you have a fever, cough or other symptoms, you might have COVID-19. Most people have mild illness and are able to recover at home. If you are sick: Keep track of your symptoms. If you have an emergency warning sign (including trouble breathing), call 911. Steps to help prevent the spread of COVID-19 if you are sick If  you are sick with COVID-19 or think you might have COVID-19, follow the steps below to care for yourself and to help protect other peoplein your home and community. Stay home except to get medical care Stay home. Most people with COVID-19 have mild illness and can recover at home without medical care. Do not leave your home, except to get medical care. Do not visit public areas. Take care of yourself. Get rest and stay hydrated. Take over-the-counter medicines, such as acetaminophen, to help you feel better. Stay in touch with your doctor. Call before you get medical care. Be sure to get care if you have trouble breathing, or have any other emergency warning signs, or if you think it is an emergency. Avoid public transportation, ride-sharing, or taxis. Separate yourself from other people As much as possible, stay in a specific room and away from other people and pets in your home. If possible, you should use a separate bathroom. If you need to be around other people or animals in oroutside of the home, wear a mask. Tell your close contactsthat they may have been exposed to COVID-19. An infected person can spread COVID-19 starting 48 hours (or 2 days) before the person has any symptoms or tests positive. By letting your close contacts know they may have been exposed to COVID-19, you are helping to protect everyone. Additional guidance is available for those living in close quarters and shared housing. See COVID-19 and Animals if you have questions about pets. If you are diagnosed with  COVID-19, someone from the health department may call you. Answer the call to slow the spread. Monitor your symptoms Symptoms of COVID-19 include fever, cough, or other symptoms. Follow care instructions from your healthcare provider and local health department. Your local health authorities may give instructions on checking your symptoms and reporting information. When to seek emergency medical attention Look for  emergency warning signs* for COVID-19. If someone is showing any of these signs, seek emergency medical care immediately: Trouble breathing Persistent pain or pressure in the chest New confusion Inability to wake or stay awake Pale, gray, or blue-colored skin, lips, or nail beds, depending on skin tone *This list is not all possible symptoms. Please call your medical provider forany other symptoms that are severe or concerning to you. Call 911 or call ahead to your local emergency facility: Notify the operator that you are seeking care for someone who has or may haveCOVID-19. Call ahead before visiting your doctor Call ahead. Many medical visits for routine care are being postponed or done by phone or telemedicine. If you have a medical appointment that cannot be postponed, call your doctor's office, and tell them you have or may have COVID-19. This will help the office protect themselves and other patients. Get tested If you have symptoms of COVID-19, get tested. While waiting for test results, you stay away from others, including staying apart from those living in your household. Self-tests are one of several options for testing for the virus that causes COVID-19 and may be more convenient than laboratory-based tests and point-of-care tests. Ask your healthcare provider or your local health department if you need help interpreting your test results. You can visit your state, tribal, local, and territorial health department's website to look for the latest local information on testing sites. If you are sick, wear a mask over your nose and mouth You should wear a mask over your nose and mouth if you must be around other people or animals, including pets (even at home). You don't need to wear the mask if you are alone. If you can't put on a mask (because of trouble breathing, for example), cover your coughs and sneezes in some other way. Try to stay at least 6 feet away from other people. This will  help protect the people around you. Masks should not be placed on young children under age 24 years, anyone who has trouble breathing, or anyone who is not able to remove the mask without help. Note: During the COVID-19 pandemic, medical grade facemasks are reserved forhealthcare workers and some first responders. Cover your coughs and sneezes Cover your mouth and nose with a tissue when you cough or sneeze. Throw away used tissues in a lined trash can. Immediately wash your hands with soap and water for at least 20 seconds. If soap and water are not available, clean your hands with an alcohol-based hand sanitizer that contains at least 60% alcohol. Clean your hands often Wash your hands often with soap and water for at least 20 seconds. This is especially important after blowing your nose, coughing, or sneezing; going to the bathroom; and before eating or preparing food. Use hand sanitizer if soap and water are not available. Use an alcohol-based hand sanitizer with at least 60% alcohol, covering all surfaces of your hands and rubbing them together until they feel dry. Soap and water are the best option, especially if hands are visibly dirty. Avoid touching your eyes, nose, and mouth with unwashed hands. Handwashing Tips Avoid sharing  personal household items Do not share dishes, drinking glasses, cups, eating utensils, towels, or bedding with other people in your home. Wash these items thoroughly after using them with soap and water or put in the dishwasher. Clean all "high-touch" surfaces every day Clean and disinfect high-touch surfaces in your "sick room" and bathroom; wear disposable gloves. Let someone else clean and disinfect surfaces in common areas, but you should clean your bedroom and bathroom, if possible. If a caregiver or other person needs to clean and disinfect a sick person's bedroom or bathroom, they should do so on an as-needed basis. The caregiver/other person should wear a mask  and disposable gloves prior to cleaning. They should wait as long as possible after the person who is sick has used the bathroom before coming in to clean and use the bathroom. High-touch surfaces include phones, remote controls, counters, tabletops, doorknobs, bathroom fixtures, toilets, keyboards, tablets, and bedside tables. Clean and disinfect areas that may have blood, stool, or body fluids on them. Use household cleaners and disinfectants. Clean the area or item with soap and water or another detergent if it is dirty. Then, use a household disinfectant. Be sure to follow the instructions on the label to ensure safe and effective use of the product. Many products recommend keeping the surface wet for several minutes to ensure germs are killed. Many also recommend precautions such as wearing gloves and making sure you have good ventilation during use of the product. Use a product from [employer] List N: Disinfectants for Coronavirus ([id number]). Complete Disinfection Guidance When you can be around others after being sick with COVID-19 Deciding when you can be around others is different for different situations. Find out when you can safely end home isolation. For any additional questions about your care,contact your healthcare provider or state or local health department. 10/16/2020 Content source: [facility] for Immunization and Respiratory Diseases ([facility]), Division of Viral Diseases This information is not intended to replace advice given to you by your health care provider. Make sure you discuss any questions you have with your healthcare provider. Document Revised: 12/13/2020 Document Reviewed: 12/13/2020 Elsevier Patient Education  2022 [place] are being prescribed MOLNUPIRAVIR for COVID-19 infection.   Please call the pharmacy or go through the drive through vs going inside if you are picking up the mediation yourself to prevent further spread. If prescribed to a [facility] affiliated pharmacy, a pharmacist will bring the medication out to your car.   ADMINISTRATION INSTRUCTIONS: Take with or without food. Swallow the tablets whole. Don't chew, crush, or break the medications because it might not work as well  For each dose of the medication, you should be taking FOUR tablets at one time, TWICE a day   Finish your full five-day course of Molnupiravir even if you feel better before you're done. Stopping this medication too early can make it less effective to prevent severe illness related to [place].    Molnupiravir is prescribed for YOU ONLY. Don't share it with others, even if they have similar symptoms as you. This medication might not be right for everyone.   Make sure to take steps to protect yourself and others while you're taking this medication in order to get well soon and to prevent others from getting sick with COVID-19.   **If you are of childbearing potential (any gender) - it is advised to not get pregnant while taking this medication and recommended that condoms are used for female partners the  next 3 months after taking the medication out of extreme caution    COMMON SIDE EFFECTS: Diarrhea Nausea  Dizziness    If your COVID-19 symptoms get worse, get medical help right away. Call 911 if you experience symptoms such as worsening cough, trouble breathing, chest pain that doesn't go away, confusion, a hard time staying awake, and pale or blue-colored skin. This medication won't prevent all COVID-19 cases from getting worse.   Can take to lessen severity: Vit C '500mg'$  twice daily Quercertin 250-'500mg'$  twice daily Zinc 75-'100mg'$  daily Melatonin 3-6 mg at bedtime Vit D3 1000-2000 IU daily Aspirin 81 mg daily with food Optional: Famotidine '20mg'$  daily Also can add tylenol/ibuprofen as needed for fevers and body aches May add Mucinex or Mucinex DM as needed for cough/congestion 10 Things You Can Do to Manage Your COVID-19 Symptoms at  Home If you have possible or confirmed COVID-19 Stay home except to get medical care. Monitor your symptoms carefully. If your symptoms get worse, call your healthcare provider immediately. Get rest and stay hydrated. If you have a medical appointment, call the healthcare provider ahead of time and tell them that you have or may have COVID-19. For medical emergencies, call 911 and notify the dispatch personnel that you have or may have COVID-19. Cover your cough and sneezes with a tissue or use the inside of your elbow. Wash your hands often with soap and water for at least 20 seconds or clean your hands with an alcohol-based hand sanitizer that contains at least 60% alcohol. As much as possible, stay in a specific room and away from other people in your home. Also, you should use a separate bathroom, if available. If you need to be around other people in or outside of the home, wear a mask. Avoid sharing personal items with other people in your household, like dishes, towels, and bedding. Clean all surfaces that are touched often, like counters, tabletops, and doorknobs. Use household cleaning sprays or wipes according to the label instructions. June 05/24/2020 This information is not intended to replace advice given to you by your health care provider. Make sure you discuss any questions you have with your healthcare provider. Document Revised: 12/13/2020 Document Reviewed: 12/13/2020 Elsevier Patient Education  [place].

## 2021-06-26 NOTE — Progress Notes (Signed)
Virtual Visit Consent   Jamie Koch, you are scheduled for a virtual visit with a [facility] provider today.     Just as with appointments in the office, your consent must be obtained to participate.  Your consent will be active for this visit and any virtual visit you may have with one of our providers in the next 365 days.     If you have a MyChart account, a copy of this consent can be sent to you electronically.  All virtual visits are billed to your insurance company just like a traditional visit in the office.    As this is a virtual visit, video technology does not allow for your provider to perform a traditional examination.  This may limit your provider's ability to fully assess your condition.  If your provider identifies any concerns that need to be evaluated in person or the need to arrange testing (such as labs, EKG, etc.), we will make arrangements to do so.     Although advances in technology are sophisticated, we cannot ensure that it will always work on either your end or our end.  If the connection with a video visit is poor, the visit may have to be switched to a telephone visit.  With either a video or telephone visit, we are not always able to ensure that we have a secure connection.     I need to obtain your verbal consent now.   Are you willing to proceed with your visit today?    Jamie Koch has provided verbal consent on 06/26/2021 for a virtual visit (video or telephone).   Mar Daring, PA-C   Date: 06/26/2021 12:14 PM   Virtual Visit via Video Note   I, Mar Daring, connected with  Jamie Koch  ([id number], Feb 24, 1956) on 06/26/21 at 12:00 PM EDT by a video-enabled telemedicine application and verified that I am speaking with the correct person using two identifiers.  Location: Patient: Virtual Visit Location Patient: Home Provider: Virtual Visit Location Provider: Home Office   I discussed the limitations of evaluation and management by  telemedicine and the availability of in person appointments. The patient expressed understanding and agreed to proceed.    History of Present Illness: Jamie Koch is a 65 y.o. who identifies as a female who was assigned female at birth, and is being seen today for Covid 68.  HPI: URI  This is a new problem. Episode onset: Tested positive for Covid 19 today, and symptoms started yesterday. The problem has been gradually worsening. There has been no fever. Associated symptoms include congestion, coughing (dry), headaches, nausea, rhinorrhea, sinus pain and a sore throat. Pertinent negatives include no diarrhea, ear pain, plugged ear sensation or vomiting. Associated symptoms comments: Post nasal drainage, mild fatigue. She has tried acetaminophen, increased fluids, NSAIDs and sleep (advil PM) for the symptoms. The treatment provided moderate relief.   Denies chills, body aches, fever  Problems:  Patient Active Problem List   Diagnosis Date Noted   Welcome to Medicare preventive visit 09/25/2020   PMB (postmenopausal bleeding) 07/17/2020   Eye irritation 07/17/2020   Prediabetes 07/11/2019   Closed fracture of phalanx of foot 08/05/2017   Right foot injury, initial encounter 05/31/2017   Preventative health care 10/19/2016   Iron deficiency anemia 04/17/2016   Hypothyroidism 04/17/2016   Hyperlipidemia 04/17/2016   Osteoarthritis 04/17/2016   Fracture of head of radius 04/14/2016    Allergies:  Allergies  Allergen Reactions   Covid-19 (Mrna) Vaccine  Other (See Comments)    Pfizer covid-19 vaccine. Caused left arm tingling, headaches, burning skin, vaginal bleeding.   Lactose Intolerance (Gi)     Upset stomach    Medroxyprogesterone Itching   Medications:  Current Outpatient Medications:    benzonatate (TESSALON) 100 MG capsule, Take 1 capsule (100 mg total) by mouth 3 (three) times daily as needed., Disp: 30 capsule, Rfl: 0   molnupiravir EUA 200 mg CAPS, Take 4 capsules (800 mg  total) by mouth 2 (two) times daily for 5 days., Disp: 40 capsule, Rfl: 0   Acetaminophen (TYLENOL ARTHRITIS PAIN PO), Take 2 tablets by mouth., Disp: , Rfl:    COVID-19 Ad26 vaccine, JANSSEN/J&J, (JANSSEN COVID-19 VACCINE) 0.5 ML injection, Inject into the muscle., Disp: 0.5 mL, Rfl: 0   diphenhydrAMINE (BENADRYL) 25 MG tablet, Take 25 mg by mouth at bedtime as needed for allergies or sleep., Disp: , Rfl:    EVENING PRIMROSE OIL PO, Take 2,000 mg by mouth daily., Disp: , Rfl:    montelukast (SINGULAIR) 10 MG tablet, Take 1 tablet (10 mg total) by mouth at bedtime. For allergies., Disp: 90 tablet, Rfl: 3   OVER THE COUNTER MEDICATION, Place 2 sprays into both nostrils daily as needed (allergies). Sovereign Silver nasal spray, Disp: , Rfl:    PRESCRIPTION MEDICATION, Testosterone 100 mg pellets placed under the skin every 3 months, Disp: , Rfl:    PRESCRIPTION MEDICATION, Estradiol 6 mg pellets placed under the skin every 3 months, Disp: , Rfl:    progesterone (PROMETRIUM) 100 MG capsule, Take 100 mg by mouth every evening., Disp: , Rfl:    simvastatin (ZOCOR) 40 MG tablet, TAKE 1/2 TABLET(20 MG) BY MOUTH DAILY FOR CHOLESTEROL, Disp: 45 tablet, Rfl: 3   spironolactone (ALDACTONE) 25 MG tablet, Take 25 mg by mouth daily., Disp: , Rfl:    thyroid (ARMOUR) 60 MG tablet, Take 60 mg by mouth daily before breakfast., Disp: , Rfl:    Vitamin D-Vitamin K (VITAMIN K2-VITAMIN D3 PO), Take 3 tablets by mouth daily., Disp: , Rfl:   Observations/Objective: Patient is well-developed, well-nourished in no acute distress.  Resting comfortably at home.  Head is normocephalic, atraumatic.  No labored breathing.  Speech is clear and coherent with logical content.  Patient is alert and oriented at baseline.    Assessment and Plan: 1. COVID-19 - MyChart COVID-19 home monitoring program; Future - molnupiravir EUA 200 mg CAPS; Take 4 capsules (800 mg total) by mouth 2 (two) times daily for 5 days.  Dispense: 40  capsule; Refill: 0 - benzonatate (TESSALON) 100 MG capsule; Take 1 capsule (100 mg total) by mouth 3 (three) times daily as needed.  Dispense: 30 capsule; Refill: 0 - Continue OTC symptomatic management of choice - Will send OTC vitamins and supplement information through AVS - Molnupiravir prescribed - Tessalon perles prescribed for cough - Patient enrolled in MyChart symptom monitoring - Push fluids - Rest as needed - Discussed return precautions and when to seek in-person evaluation, sent via AVS as well  Follow Up Instructions: I discussed the assessment and treatment plan with the patient. The patient was provided an opportunity to ask questions and all were answered. The patient agreed with the plan and demonstrated an understanding of the instructions.  A copy of instructions were sent to the patient via MyChart.  The patient was advised to call back or seek an in-person evaluation if the symptoms worsen or if the condition fails to improve as anticipated.  Time:  I  spent 17 minutes with the patient via telehealth technology discussing the above problems/concerns.    Mar Daring, PA-C

## 2021-08-16 LAB — COLOGUARD
COLOGUARD: NEGATIVE
Cologuard: NEGATIVE

## 2021-08-27 ENCOUNTER — Encounter: Payer: Self-pay | Admitting: Primary Care

## 2021-09-04 ENCOUNTER — Other Ambulatory Visit: Payer: Self-pay | Admitting: Primary Care

## 2021-09-04 DIAGNOSIS — E785 Hyperlipidemia, unspecified: Secondary | ICD-10-CM

## 2021-10-04 ENCOUNTER — Other Ambulatory Visit: Payer: Self-pay | Admitting: Primary Care

## 2021-10-04 DIAGNOSIS — E785 Hyperlipidemia, unspecified: Secondary | ICD-10-CM

## 2021-10-15 ENCOUNTER — Other Ambulatory Visit: Payer: Self-pay | Admitting: Primary Care

## 2021-10-15 ENCOUNTER — Other Ambulatory Visit: Payer: Self-pay

## 2021-10-15 ENCOUNTER — Ambulatory Visit (INDEPENDENT_AMBULATORY_CARE_PROVIDER_SITE_OTHER): Payer: Medicare Other | Admitting: Primary Care

## 2021-10-15 ENCOUNTER — Encounter: Payer: Self-pay | Admitting: Primary Care

## 2021-10-15 VITALS — BP 130/64 | HR 68 | Temp 97.6°F | Ht 64.0 in | Wt 213.0 lb

## 2021-10-15 DIAGNOSIS — R7303 Prediabetes: Secondary | ICD-10-CM

## 2021-10-15 DIAGNOSIS — M810 Age-related osteoporosis without current pathological fracture: Secondary | ICD-10-CM

## 2021-10-15 DIAGNOSIS — E782 Mixed hyperlipidemia: Secondary | ICD-10-CM | POA: Diagnosis not present

## 2021-10-15 DIAGNOSIS — M542 Cervicalgia: Secondary | ICD-10-CM | POA: Diagnosis not present

## 2021-10-15 DIAGNOSIS — Z1231 Encounter for screening mammogram for malignant neoplasm of breast: Secondary | ICD-10-CM

## 2021-10-15 DIAGNOSIS — E2839 Other primary ovarian failure: Secondary | ICD-10-CM | POA: Diagnosis not present

## 2021-10-15 DIAGNOSIS — E039 Hypothyroidism, unspecified: Secondary | ICD-10-CM

## 2021-10-15 DIAGNOSIS — M1990 Unspecified osteoarthritis, unspecified site: Secondary | ICD-10-CM

## 2021-10-15 DIAGNOSIS — E785 Hyperlipidemia, unspecified: Secondary | ICD-10-CM

## 2021-10-15 DIAGNOSIS — M8580 Other specified disorders of bone density and structure, unspecified site: Secondary | ICD-10-CM | POA: Insufficient documentation

## 2021-10-15 LAB — CBC
HCT: 42.8 % (ref 36.0–46.0)
Hemoglobin: 14.5 g/dL (ref 12.0–15.0)
MCHC: 33.9 g/dL (ref 30.0–36.0)
MCV: 89 fl (ref 78.0–100.0)
Platelets: 294 10*3/uL (ref 150.0–400.0)
RBC: 4.82 Mil/uL (ref 3.87–5.11)
RDW: 12.4 % (ref 11.5–15.5)
WBC: 7.9 10*3/uL (ref 4.0–10.5)

## 2021-10-15 LAB — COMPREHENSIVE METABOLIC PANEL
ALT: 24 U/L (ref 0–35)
AST: 22 U/L (ref 0–37)
Albumin: 4.3 g/dL (ref 3.5–5.2)
Alkaline Phosphatase: 54 U/L (ref 39–117)
BUN: 11 mg/dL (ref 6–23)
CO2: 30 mEq/L (ref 19–32)
Calcium: 9.6 mg/dL (ref 8.4–10.5)
Chloride: 104 mEq/L (ref 96–112)
Creatinine, Ser: 0.76 mg/dL (ref 0.40–1.20)
GFR: 81.85 mL/min (ref >60.00)
Glucose, Bld: 94 mg/dL (ref 70–99)
Potassium: 3.7 mEq/L (ref 3.5–5.1)
Sodium: 139 mEq/L (ref 135–145)
Total Bilirubin: 0.5 mg/dL (ref 0.2–1.2)
Total Protein: 6.6 g/dL (ref 6.0–8.3)

## 2021-10-15 LAB — LIPID PANEL
Cholesterol: 137 mg/dL (ref 0–200)
HDL: 39.4 mg/dL (ref >39.00)
LDL Cholesterol: 58 mg/dL (ref 0–99)
NonHDL: 97.38
Total CHOL/HDL Ratio: 3
Triglycerides: 195 mg/dL — ABNORMAL HIGH (ref 0.0–149.0)
VLDL: 39 mg/dL (ref 0.0–40.0)

## 2021-10-15 LAB — HEMOGLOBIN A1C: Hgb A1c MFr Bld: 6.1 % (ref 4.6–6.5)

## 2021-10-15 MED ORDER — SIMVASTATIN 40 MG PO TABS: ORAL_TABLET | ORAL | 3 refills | Status: DC

## 2021-10-15 MED ORDER — PREDNISONE 20 MG PO TABS: ORAL_TABLET | ORAL | 0 refills | Status: DC
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
## 2021-10-15 NOTE — Assessment & Plan Note (Signed)
Following with Lyn Henri MD who has her managed on hormonal treatment.  Repeat bone density scan pending.

## 2021-10-15 NOTE — Assessment & Plan Note (Signed)
Compliant to simvastatin 20 mg, continue same. Repeat lipid panel pending.  Discussed the importance of a healthy diet and regular exercise in order for weight loss, and to reduce the risk of further co-morbidity.

## 2021-10-15 NOTE — Patient Instructions (Signed)
Stop by the lab prior to leaving today. I will notify you of your results once received.   Start prednisone tablets for your neck and arm pain. Take two tablets my mouth once daily for four days, then one tablet once daily for four days.   Continue Tylenol as needed for pain.   [facility] and bone density scan.  It was a pleasure to see you today!
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
## 2021-10-15 NOTE — Assessment & Plan Note (Signed)
Following with Lyn Henri MD who manages her labs. Continue Armor Thyroid 60 mg.
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
## 2021-10-15 NOTE — Assessment & Plan Note (Signed)
Discussed the importance of a healthy diet and regular exercise in order for weight loss, and to reduce the risk of further co-morbidity. ? ?Repeat A1C pending. ?

## 2022-01-26 ENCOUNTER — Other Ambulatory Visit: Payer: Self-pay

## 2022-01-26 ENCOUNTER — Ambulatory Visit
Admission: RE | Admit: 2022-01-26 | Discharge: 2022-01-26 | Disposition: A | Discharge status: Home / Self Care | Payer: Medicare Other | Source: Ambulatory Visit | Attending: Primary Care | Admitting: Primary Care

## 2022-01-26 DIAGNOSIS — E2839 Other primary ovarian failure: Secondary | ICD-10-CM | POA: Insufficient documentation

## 2022-02-03 ENCOUNTER — Ambulatory Visit (INDEPENDENT_AMBULATORY_CARE_PROVIDER_SITE_OTHER): Payer: Medicare Other | Admitting: *Deleted

## 2022-02-03 DIAGNOSIS — Z0000 Encounter for general adult medical examination without abnormal findings: Secondary | ICD-10-CM

## 2022-02-03 NOTE — Patient Instructions (Signed)
Jamie Koch , ?Thank you for taking time to come for your Medicare Wellness Visit. I appreciate your ongoing commitment to your health goals. Please review the following plan we discussed and let me know if I can assist you in the future.  ? ?Screening recommendations/referrals: ?Colonoscopy: up to date ?Mammogram: Education provided ?Bone Density: up to date ?Recommended yearly ophthalmology/optometry visit for glaucoma screening and checkup ?Recommended yearly dental visit for hygiene and checkup ? ?Vaccinations: ?Influenza vaccine: up to date ?Pneumococcal vaccine: Education provided ?Tdap vaccine: up to date ?Shingles vaccine: up to date   ? ?Advanced directives: yes not on file ? ?Conditions/risks identified:  ? ? ? ? ?Preventive Care 89 Years and Older, Female ?Preventive care refers to lifestyle choices and visits with your health care provider that can promote health and wellness. ?What does preventive care include? ?A yearly physical exam. This is also called an annual well check. ?Dental exams once or twice a year. ?Routine eye exams. Ask your health care provider how often you should have your eyes checked. ?Personal lifestyle choices, including: ?Daily care of your teeth and gums. ?Regular physical activity. ?Eating a healthy diet. ?Avoiding tobacco and drug use. ?Limiting alcohol use. ?Practicing safe sex. ?Taking low-dose aspirin every day. ?Taking vitamin and mineral supplements as recommended by your health care provider. ?What happens during an annual well check? ?The services and screenings done by your health care provider during your annual well check will depend on your age, overall health, lifestyle risk factors, and family history of disease. ?Counseling  ?Your health care provider may ask you questions about your: ?Alcohol use. ?Tobacco use. ?Drug use. ?Emotional well-being. ?Home and relationship well-being. ?Sexual activity. ?Eating habits. ?History of falls. ?Memory and ability to  understand (cognition). ?Work and work Statistician. ?Reproductive health. ?Screening  ?You may have the following tests or measurements: ?Height, weight, and BMI. ?Blood pressure. ?Lipid and cholesterol levels. These may be checked every 5 years, or more frequently if you are over 9 years old. ?Skin check. ?Lung cancer screening. You may have this screening every year starting at age 32 if you have a 30-pack-year history of smoking and currently smoke or have quit within the past 15 years. ?Fecal occult blood test (FOBT) of the stool. You may have this test every year starting at age 68. ?Flexible sigmoidoscopy or colonoscopy. You may have a sigmoidoscopy every 5 years or a colonoscopy every 10 years starting at age 18. ?Hepatitis C blood test. ?Hepatitis B blood test. ?Sexually transmitted disease (STD) testing. ?Diabetes screening. This is done by checking your blood sugar (glucose) after you have not eaten for a while (fasting). You may have this done every 1-3 years. ?Bone density scan. This is done to screen for osteoporosis. You may have this done starting at age 39. ?Mammogram. This may be done every 1-2 years. Talk to your health care provider about how often you should have regular mammograms. ?Talk with your health care provider about your test results, treatment options, and if necessary, the need for more tests. ?Vaccines  ?Your health care provider may recommend certain vaccines, such as: ?Influenza vaccine. This is recommended every year. ?Tetanus, diphtheria, and acellular pertussis (Tdap, Td) vaccine. You may need a Td booster every 10 years. ?Zoster vaccine. You may need this after age 37. ?Pneumococcal 13-valent conjugate (PCV13) vaccine. One dose is recommended after age 27. ?Pneumococcal polysaccharide (PPSV23) vaccine. One dose is recommended after age 63. ?Talk to your health care provider about which screenings and  vaccines you need and how often you need them. ?This information is not  intended to replace advice given to you by your health care provider. Make sure you discuss any questions you have with your health care provider. ?Document Released: 11/22/2015 Document Revised: 07/15/2016 Document Reviewed: 08/27/2015 ?Elsevier Interactive Patient Education ? 2017 [employer]. ? ?Fall Prevention in the Home ?Falls can cause injuries. They can happen to people of all ages. There are many things you can do to make your home safe and to help prevent falls. ?What can I do on the outside of my home? ?Regularly fix the edges of walkways and driveways and fix any cracks. ?Remove anything that might make you trip as you walk through a door, such as a raised step or threshold. ?Trim any bushes or trees on the path to your home. ?Use bright outdoor lighting. ?Clear any walking paths of anything that might make someone trip, such as rocks or tools. ?Regularly check to see if handrails are loose or broken. Make sure that both sides of any steps have handrails. ?Any raised decks and porches should have guardrails on the edges. ?Have any leaves, snow, or ice cleared regularly. ?Use sand or salt on walking paths during winter. ?Clean up any spills in your garage right away. This includes oil or grease spills. ?What can I do in the bathroom? ?Use night lights. ?Install grab bars by the toilet and in the tub and shower. Do not use towel bars as grab bars. ?Use non-skid mats or decals in the tub or shower. ?If you need to sit down in the shower, use a plastic, non-slip stool. ?Keep the floor dry. Clean up any water that spills on the floor as soon as it happens. ?Remove soap buildup in the tub or shower regularly. ?Attach bath mats securely with double-sided non-slip rug tape. ?Do not have throw rugs and other things on the floor that can make you trip. ?What can I do in the bedroom? ?Use night lights. ?Make sure that you have a light by your bed that is easy to reach. ?Do not use any sheets or blankets that are  too big for your bed. They should not hang down onto the floor. ?Have a firm chair that has side arms. You can use this for support while you get dressed. ?Do not have throw rugs and other things on the floor that can make you trip. ?What can I do in the kitchen? ?Clean up any spills right away. ?Avoid walking on wet floors. ?Keep items that you use a lot in easy-to-reach places. ?If you need to reach something above you, use a strong step stool that has a grab bar. ?Keep electrical cords out of the way. ?Do not use floor polish or wax that makes floors slippery. If you must use wax, use non-skid floor wax. ?Do not have throw rugs and other things on the floor that can make you trip. ?What can I do with my stairs? ?Do not leave any items on the stairs. ?Make sure that there are handrails on both sides of the stairs and use them. Fix handrails that are broken or loose. Make sure that handrails are as long as the stairways. ?Check any carpeting to make sure that it is firmly attached to the stairs. Fix any carpet that is loose or worn. ?Avoid having throw rugs at the top or bottom of the stairs. If you do have throw rugs, attach them to the floor with carpet tape. ?Make  sure that you have a light switch at the top of the stairs and the bottom of the stairs. If you do not have them, ask someone to add them for you. ?What else can I do to help prevent falls? ?Wear shoes that: ?Do not have high heels. ?Have rubber bottoms. ?Are comfortable and fit you well. ?Are closed at the toe. Do not wear sandals. ?If you use a stepladder: ?Make sure that it is fully opened. Do not climb a closed stepladder. ?Make sure that both sides of the stepladder are locked into place. ?Ask someone to hold it for you, if possible. ?Clearly mark and make sure that you can see: ?Any grab bars or handrails. ?First and last steps. ?Where the edge of each step is. ?Use tools that help you move around (mobility aids) if they are needed. These  include: ?Canes. ?Walkers. ?Scooters. ?Crutches. ?Turn on the lights when you go into a dark area. Replace any light bulbs as soon as they burn out. ?Set up your furniture so you have a clear path. Avoid moving your furni

## 2022-02-03 NOTE — Progress Notes (Signed)
? ?Subjective:  ? Jamie Koch is a 66 y.o. female who presents for Medicare Annual (Subsequent) preventive examination. ? ?I connected with  [name] on 02/03/22 by a telephone enabled telemedicine application and verified that I am speaking with the correct person using two identifiers. ?  ?I discussed the limitations of evaluation and management by telemedicine. The patient expressed understanding and agreed to proceed. ? ?Patient location: home ? ?Provider location: Tele-Health not in office ? ? ? ?Review of Systems    ? ?Cardiac Risk Factors include: advanced age (>40mn, >>20women);obesity (BMI >30kg/m2);sedentary lifestyle ? ?   ?Objective:  ?  ?Today's Vitals  ? ?There is no height or weight on file to calculate BMI. ? ? ?  02/03/2022  ?  9:02 AM 07/29/2020  ? 10:36 AM 07/23/2020  ? 11:04 AM 06/15/2020  ? 12:24 PM 04/10/2016  ?  1:43 PM  ?Advanced Directives  ?Does Patient Have a Medical Advance Directive? Yes Yes Yes Yes Yes  ?Type of AParamedicof ALarnedLiving will HParmerLiving will Living will Living will;Healthcare Power of Attorney  ?Does patient want to make changes to medical advance directive?  No - Patient declined No - Patient declined    ?Copy of HBuelltonin Chart? No - copy requested No - copy requested No - copy requested    ?Would patient like information on creating a medical advance directive? No - Patient declined      ? ? ?Current Medications (verified) ?Outpatient Encounter Medications as of 02/03/2022  ?Medication Sig  ? Acetaminophen (TYLENOL ARTHRITIS PAIN PO) Take 2 tablets by mouth.  ? diphenhydrAMINE (BENADRYL) 25 MG tablet Take 25 mg by mouth at bedtime as needed for allergies or sleep.  ? EVENING PRIMROSE OIL PO Take 2,000 mg by mouth daily.  ? OVER THE COUNTER MEDICATION Place 2 sprays into both nostrils daily as needed (allergies). Sovereign Silver nasal spray  ? PRESCRIPTION  MEDICATION Testosterone 100 mg pellets placed under the skin every 3 months  ? PRESCRIPTION MEDICATION Estradiol 6 mg pellets placed under the skin every 3 months  ? progesterone (PROMETRIUM) 100 MG capsule Take 100 mg by mouth every evening.  ? simvastatin (ZOCOR) 40 MG tablet TAKE 1/2 TABLET(20 MG) BY MOUTH DAILY for cholesterol.  ? spironolactone (ALDACTONE) 25 MG tablet Take 25 mg by mouth daily.  ? thyroid (ARMOUR) 60 MG tablet Take 60 mg by mouth daily before breakfast.  ? Vitamin D-Vitamin K (VITAMIN K2-VITAMIN D3 PO) Take 3 tablets by mouth daily.  ? predniSONE (DELTASONE) 20 MG tablet Take two tablets my mouth once daily for four days, then one tablet once daily for four days.  ? ?No facility-administered encounter medications on file as of 02/03/2022.  ? ? ?Allergies (verified) ?Covid-19 (mrna) vaccine, Lactose intolerance (gi), and Medroxyprogesterone  ? ?History: ?Past Medical History:  ?Diagnosis Date  ? Arthritis   ? Chicken pox   ? Depression   ? Eye irritation 07/17/2020  ? Hay fever   ? High cholesterol   ? Hypothyroidism   ? Osteoporosis   ? PMB (postmenopausal bleeding) 07/17/2020  ? Thyroid dysfunction   ? ?Past Surgical History:  ?Procedure Laterality Date  ? DILATATION & CURETTAGE/HYSTEROSCOPY WITH MYOSURE N/A 07/29/2020  ? Procedure: FRACTIONAL DILATATION & CURETTAGE/HYSTEROSCOPY WITH MYOSURE RESECTION OF ENDOMETRIAL POLYP;  Surgeon: Schermerhorn, TGwen Her MD;  Location: [facility] ORS;  Service: Gynecology;  Laterality: N/A;  ? PARTIAL KNEE ARTHROPLASTY  Left   ? [place]  ? TOTAL KNEE ARTHROPLASTY Right   ? ?History reviewed. No pertinent family history. ?Social History  ? ?Socioeconomic History  ? Marital status: Married  ?  Spouse name: Not on file  ? Number of children: Not on file  ? Years of education: Not on file  ? Highest education level: Not on file  ?Occupational History  ? Not on file  ?Tobacco Use  ? Smoking status: Never  ? Smokeless tobacco: Never  ?Vaping Use   ? Vaping Use: Never used  ?Substance and Sexual Activity  ? Alcohol use: No  ? Drug use: No  ? Sexual activity: Yes  ?Other Topics Concern  ? Not on file  ?Social History Narrative  ? Married.  ? 1 child.  ? Retired. Worked in Insurance claims handler.   ? Enjoys reading, gardening.   ? ?Social Determinants of Health  ? ?Financial Resource Strain: Low Risk   ? Difficulty of Paying Living Expenses: Not hard at all  ?Food Insecurity: No Food Insecurity  ? Worried About Charity fundraiser in the Last Year: Never true  ? Ran Out of Food in the Last Year: Never true  ?Transportation Needs: No Transportation Needs  ? Lack of Transportation (Medical): No  ? Lack of Transportation (Non-Medical): No  ?Physical Activity: Inactive  ? Days of Exercise per Week: 0 days  ? Minutes of Exercise per Session: 0 min  ?Stress: No Stress Concern Present  ? Feeling of Stress : Not at all  ?Social Connections: Moderately Integrated  ? Frequency of Communication with Friends and Family: More than three times a week  ? Frequency of Social Gatherings with Friends and Family: Never  ? Attends Religious Services: More than 4 times per year  ? Active Member of Clubs or Organizations: No  ? Attends Archivist Meetings: Never  ? Marital Status: Married  ? ? ?Tobacco Counseling ?Counseling given: Not Answered ? ? ?Clinical Intake: ? ?Pre-visit preparation completed: Yes ? ?Pain : No/denies pain ? ?  ? ?Nutritional Risks: None ?Diabetes: No ? ?How often do you need to have someone help you when you read instructions, pamphlets, or other written materials from your doctor or pharmacy?: 1 - Never ? ?Diabetic?   no ? ?Interpreter Needed?: No ? ?Information entered by :: Leroy Kennedy LPN ? ? ?Activities of Daily Living ? ?  02/03/2022  ?  9:04 AM 10/15/2021  ? 11:19 AM  ?In your present state of health, do you have any difficulty performing the following activities:  ?Hearing? 0 0  ?Vision?  0  ?Difficulty concentrating or making decisions? 0 0   ?Walking or climbing stairs? 0 0  ?Dressing or bathing? 0 0  ?Doing errands, shopping? 0 0  ?Preparing Food and eating ? N   ?Using the Toilet? N   ?In the past six months, have you accidently leaked urine? N   ?Do you have problems with loss of bowel control? N   ?Managing your Medications? N   ?Managing your Finances? N   ?Housekeeping or managing your Housekeeping? N   ? ? ?Patient Care Team: ?Pleas Koch, NP as PCP - General (Internal Medicine) ?Schermerhorn, Gwen Her, MD as Referring Physician (Obstetrics and Gynecology) ? ?Indicate any recent Medical Services you may have received from other than [facility] providers in the past year (date may be approximate). ? ?   ?Assessment:  ? This is a routine wellness examination  for Naamah. ? ?Hearing/Vision screen ?Hearing Screening - Comments:: No trouble hearing ?Vision Screening - Comments:: Up to date ?[place] ? ?Dietary issues and exercise activities discussed: ?Current Exercise Habits: The patient does not participate in regular exercise at present, Exercise limited by: None identified ? ? Goals Addressed   ? ?  ?  ?  ?  ? This Visit's Progress  ?  Weight (lb) < 200 lb (90.7 kg)     ? ?  ? ?Depression Screen ? ?  02/03/2022  ?  9:13 AM 10/15/2021  ? 11:18 AM 09/25/2020  ?  8:18 AM  ?PHQ 2/9 Scores  ?PHQ - 2 Score 0 0 0  ?PHQ- 9 Score  0 0  ?  ?Fall Risk ? ?  02/03/2022  ?  9:03 AM 10/15/2021  ? 11:19 AM 09/25/2020  ?  8:40 AM 09/25/2020  ?  8:18 AM  ?Fall Risk   ?Falls in the past year? 0 0 0 0  ?Number falls in past yr: 0 0 0 0  ?Injury with Fall? 0 0 0 0  ?Follow up Falls evaluation completed;Education provided;Falls prevention discussed     ? ? ?FALL RISK PREVENTION PERTAINING TO THE HOME: ? ?Any stairs in or around the home? Yes  ?If so, are there any without handrails? No  ?Home free of loose throw rugs in walkways, pet beds, electrical cords, etc? Yes  ?Adequate lighting in your home to reduce risk of falls? Yes  ? ?ASSISTIVE DEVICES UTILIZED TO PREVENT  FALLS: ? ?Life alert? No  ?Use of a cane, walker or w/c? No  ?Grab bars in the bathroom? No  ?Shower chair or bench in shower? No  ?Elevated toilet seat or a handicapped toilet? No  ? ?TIMED UP AND GO: ? ?Was

## 2022-02-13 ENCOUNTER — Other Ambulatory Visit: Payer: Self-pay | Admitting: Nurse Practitioner

## 2022-02-13 ENCOUNTER — Other Ambulatory Visit (HOSPITAL_COMMUNITY): Payer: Self-pay | Admitting: Nurse Practitioner

## 2022-02-13 DIAGNOSIS — N950 Postmenopausal bleeding: Secondary | ICD-10-CM

## 2022-02-19 ENCOUNTER — Ambulatory Visit
Admission: RE | Admit: 2022-02-19 | Discharge: 2022-02-19 | Disposition: A | Discharge status: Home / Self Care | Payer: Medicare Other | Source: Ambulatory Visit | Attending: Nurse Practitioner | Admitting: Nurse Practitioner

## 2022-02-19 ENCOUNTER — Other Ambulatory Visit: Payer: Self-pay

## 2022-02-19 DIAGNOSIS — N950 Postmenopausal bleeding: Secondary | ICD-10-CM | POA: Diagnosis present

## 2022-07-02 HISTORY — PX: LAPAROSCOPIC TOTAL HYSTERECTOMY: SUR800

## 2022-09-04 DIAGNOSIS — R921 Mammographic calcification found on diagnostic imaging of breast: Secondary | ICD-10-CM

## 2022-09-08 ENCOUNTER — Other Ambulatory Visit: Payer: Self-pay | Admitting: Primary Care

## 2022-09-08 DIAGNOSIS — R921 Mammographic calcification found on diagnostic imaging of breast: Secondary | ICD-10-CM

## 2022-09-20 ENCOUNTER — Other Ambulatory Visit: Payer: Self-pay | Admitting: Primary Care

## 2022-09-20 DIAGNOSIS — E785 Hyperlipidemia, unspecified: Secondary | ICD-10-CM

## 2022-09-20 NOTE — Telephone Encounter (Signed)
Patient is due for follow up in late December/early January 2024, this will be required prior to any further refills.  Please schedule.

## 2022-10-16 ENCOUNTER — Ambulatory Visit
Admission: RE | Admit: 2022-10-16 | Discharge: 2022-10-16 | Disposition: A | Discharge status: Home / Self Care | Payer: Medicare Other | Source: Ambulatory Visit | Attending: Primary Care | Admitting: Primary Care

## 2022-10-16 DIAGNOSIS — R921 Mammographic calcification found on diagnostic imaging of breast: Secondary | ICD-10-CM | POA: Insufficient documentation

## 2022-10-19 ENCOUNTER — Ambulatory Visit (INDEPENDENT_AMBULATORY_CARE_PROVIDER_SITE_OTHER): Payer: Medicare Other | Admitting: Primary Care

## 2022-10-19 ENCOUNTER — Encounter: Payer: Self-pay | Admitting: Primary Care

## 2022-10-19 VITALS — BP 132/86 | HR 70 | Temp 97.5°F | Ht 64.0 in | Wt 217.0 lb

## 2022-10-19 DIAGNOSIS — M1990 Unspecified osteoarthritis, unspecified site: Secondary | ICD-10-CM

## 2022-10-19 DIAGNOSIS — E559 Vitamin D deficiency, unspecified: Secondary | ICD-10-CM | POA: Diagnosis not present

## 2022-10-19 DIAGNOSIS — R7303 Prediabetes: Secondary | ICD-10-CM | POA: Diagnosis not present

## 2022-10-19 DIAGNOSIS — E785 Hyperlipidemia, unspecified: Secondary | ICD-10-CM | POA: Diagnosis not present

## 2022-10-19 DIAGNOSIS — E039 Hypothyroidism, unspecified: Secondary | ICD-10-CM | POA: Diagnosis not present

## 2022-10-19 DIAGNOSIS — M8580 Other specified disorders of bone density and structure, unspecified site: Secondary | ICD-10-CM

## 2022-10-19 LAB — VITAMIN D 25 HYDROXY (VIT D DEFICIENCY, FRACTURES): VITD: 30.54 ng/mL (ref 30.00–100.00)

## 2022-10-19 LAB — LIPID PANEL
Cholesterol: 146 mg/dL (ref 0–200)
HDL: 36.8 mg/dL — ABNORMAL LOW (ref >39.00)
LDL Cholesterol: 70 mg/dL (ref 0–99)
NonHDL: 108.97
Total CHOL/HDL Ratio: 4
Triglycerides: 193 mg/dL — ABNORMAL HIGH (ref 0.0–149.0)
VLDL: 38.6 mg/dL (ref 0.0–40.0)

## 2022-10-19 LAB — COMPREHENSIVE METABOLIC PANEL
ALT: 15 U/L (ref 0–35)
AST: 13 U/L (ref 0–37)
Albumin: 4.3 g/dL (ref 3.5–5.2)
Alkaline Phosphatase: 54 U/L (ref 39–117)
BUN: 8 mg/dL (ref 6–23)
CO2: 31 mEq/L (ref 19–32)
Calcium: 9.2 mg/dL (ref 8.4–10.5)
Chloride: 100 mEq/L (ref 96–112)
Creatinine, Ser: 0.66 mg/dL (ref 0.40–1.20)
GFR: 90.98 mL/min (ref >60.00)
Glucose, Bld: 124 mg/dL — ABNORMAL HIGH (ref 70–99)
Potassium: 3.7 mEq/L (ref 3.5–5.1)
Sodium: 140 mEq/L (ref 135–145)
Total Bilirubin: 0.7 mg/dL (ref 0.2–1.2)
Total Protein: 6.3 g/dL (ref 6.0–8.3)

## 2022-10-19 LAB — HEMOGLOBIN A1C: Hgb A1c MFr Bld: 6.2 % (ref 4.6–6.5)

## 2022-10-19 MED ORDER — GABAPENTIN 300 MG PO CAPS: 300.0000 mg | ORAL_CAPSULE | Freq: Every day | ORAL | 3 refills | Status: DC

## 2022-10-19 NOTE — Assessment & Plan Note (Signed)
She is taking Armour Thyroid 60 mg correctly. Continue Armour Thyroid 60 mg daily. Follows with Lyn Henri MD.

## 2022-10-19 NOTE — Assessment & Plan Note (Signed)
Reviewed bone density scan from March 2023. Continue calcium and vitamin D.

## 2022-10-19 NOTE — Patient Instructions (Signed)
Stop by the lab prior to leaving today. I will notify you of your results once received.   Resume gabapentin 300 mg at bedtime for pain.  It was a pleasure to see you today!

## 2022-10-19 NOTE — Progress Notes (Signed)
Subjective:    Patient ID: Jamie Koch, female    DOB: 08/14/1956, 66 y.o.   MRN: [id number]  HPI  Jamie Koch is a very pleasant 66 y.o. female with a history of hypothyroidism, osteoporosis, osteoarthritis, hyperlipidemia, prediabetes who presents today for follow-up of chronic conditions.  1) Hypothyroidism: Currently managed on Armour Thyroid 60 mg daily.  She is taking thyroid medication every morning on an empty stomach with water only.   No food or other medications for 30 minutes.   No heartburn medication, iron pills, calcium, vitamin D, or magnesium pills within four hours of taking levothyroxine.   She follows with Lyn Henri MD who prescribes her Thyroid medication. She is due for a recheck of thyroid hormones soon.   2) Osteoarthritis: Currently managed on gabapentin 300 mg daily. Previously prescribed by her GYN for whom she no longer sees. She ran out of her gabapentin about 1-2 months ago. She would like to resume gabapentin as she's noticed increased arthritis pain at night.   3) Hyperlipidemia: Currently managed on simvastatin 20 mg daily. She is due for repeat lipid panel today.    Immunizations: -Tetanus: 2016 -Influenza: Completed this season  -Shingles: Completed Shingrix and Zostavax series -Pneumonia: Prevnar 13 in 2021, Pneumovax 23 in 2011   Mammogram: Completed in December 2023  Colonoscopy: Completed Cologuard in 2022, normal. Dexa: Completed in March 2023  BP Readings from Last 3 Encounters:  10/19/22 132/86  10/15/21 130/64  09/25/20 136/82        Review of Systems  Respiratory:  Negative for shortness of breath.   Cardiovascular:  Negative for chest pain.  Gastrointestinal:  Negative for constipation and diarrhea.  Musculoskeletal:  Positive for arthralgias.  Neurological:  Negative for headaches.  Psychiatric/Behavioral:  The patient is not nervous/anxious.          Past Medical History:  Diagnosis Date   Arthritis     Chicken pox    Depression    Eye irritation 07/17/2020   Hay fever    High cholesterol    Hypothyroidism    Osteoporosis    PMB (postmenopausal bleeding) 07/17/2020   Thyroid dysfunction     Social History   Socioeconomic History   Marital status: Married    Spouse name: Not on file   Number of children: Not on file   Years of education: Not on file   Highest education level: Not on file  Occupational History   Not on file  Tobacco Use   Smoking status: Never   Smokeless tobacco: Never  Vaping Use   Vaping Use: Never used  Substance and Sexual Activity   Alcohol use: No   Drug use: No   Sexual activity: Yes  Other Topics Concern   Not on file  Social History Narrative   Married.   1 child.   Retired. Worked in Insurance claims handler.    Enjoys reading, gardening.    Social Determinants of Health   Financial Resource Strain: Low Risk  (02/03/2022)   Overall Financial Resource Strain (CARDIA)    Difficulty of Paying Living Expenses: Not hard at all  Food Insecurity: No Food Insecurity (02/03/2022)   Hunger Vital Sign    Worried About Running Out of Food in the Last Year: Never true    Ran Out of Food in the Last Year: Never true  Transportation Needs: No Transportation Needs (02/03/2022)   PRAPARE - Transportation    Lack of Transportation (Medical): No    Lack of  Transportation (Non-Medical): No  Physical Activity: Inactive (02/03/2022)   Exercise Vital Sign    Days of Exercise per Week: 0 days    Minutes of Exercise per Session: 0 min  Stress: No Stress Concern Present (02/03/2022)   Mobile    Feeling of Stress : Not at all  Social Connections: Moderately Integrated (02/03/2022)   Social Connection and Isolation Panel [NHANES]    Frequency of Communication with Friends and Family: More than three times a week    Frequency of Social Gatherings with Friends and Family: Never    Attends Religious  Services: More than 4 times per year    Active Member of [employer] or Organizations: No    Attends Archivist Meetings: Never    Marital Status: Married  Human resources officer Violence: Not At Risk (02/03/2022)   Humiliation, Afraid, Rape, and Kick questionnaire    Fear of Current or Ex-Partner: No    Emotionally Abused: No    Physically Abused: No    Sexually Abused: No    Past Surgical History:  Procedure Laterality Date   DILATATION & CURETTAGE/HYSTEROSCOPY WITH MYOSURE N/A 07/29/2020   Procedure: [place] RESECTION OF ENDOMETRIAL POLYP;  Surgeon: Schermerhorn, Gwen Her, MD;  Location: [facility] ORS;  Service: Gynecology;  Laterality: N/A;   LAPAROSCOPIC TOTAL HYSTERECTOMY  07/02/2022   PARTIAL KNEE ARTHROPLASTY Left    TONSILLECTOMY AND ADENOIDECTOMY  1964   TOTAL KNEE ARTHROPLASTY Right     History reviewed. No pertinent family history.  Allergies  Allergen Reactions   Covid-19 (Mrna) Vaccine Other (See Comments)    Pfizer covid-19 vaccine. Caused left arm tingling, headaches, burning skin, vaginal bleeding.   Lactose Intolerance (Gi)     Upset stomach    Medroxyprogesterone Itching    Current Outpatient Medications on File Prior to Visit  Medication Sig Dispense Refill   Acetaminophen (TYLENOL ARTHRITIS PAIN PO) Take 2 tablets by mouth.     diphenhydrAMINE (BENADRYL) 25 MG tablet Take 25 mg by mouth at bedtime as needed for allergies or sleep.     EVENING PRIMROSE OIL PO Take 2,000 mg by mouth daily.     OVER THE COUNTER MEDICATION Place 2 sprays into both nostrils daily as needed (allergies). Sovereign Silver nasal spray     PRESCRIPTION MEDICATION Testosterone 100 mg pellets placed under the skin every 3 months     PRESCRIPTION MEDICATION Estradiol 6 mg pellets placed under the skin every 3 months     progesterone (PROMETRIUM) 100 MG capsule Take 100 mg by mouth every evening.     simvastatin (ZOCOR) 40 MG tablet TAKE 1/2  TABLET(20 MG) BY MOUTH DAILY for cholesterol. Office visit required for further refills. 45 tablet 0   thyroid (ARMOUR) 60 MG tablet Take 60 mg by mouth daily before breakfast.     Vitamin D-Vitamin K (VITAMIN K2-VITAMIN D3 PO) Take 3 tablets by mouth daily.     No current facility-administered medications on file prior to visit.    BP 132/86   Pulse 70   Temp (!) 97.5 F (36.4 C) (Temporal)   Ht '5\' 4"'$  (1.626 m)   Wt 217 lb (98.4 kg)   SpO2 96%   BMI 37.25 kg/m  Objective:   Physical Exam Cardiovascular:     Rate and Rhythm: Normal rate and regular rhythm.  Pulmonary:     Effort: Pulmonary effort is normal.     Breath sounds: Normal  breath sounds.  Abdominal:     Palpations: Abdomen is soft.     Tenderness: There is no abdominal tenderness.  Musculoskeletal:     Cervical back: Neck supple.  Skin:    General: Skin is warm and dry.  Psychiatric:        Mood and Affect: Mood normal.           Assessment & Plan:   Problem List Items Addressed This Visit       Endocrine   Hypothyroidism    She is taking Armour Thyroid 60 mg correctly. Continue Armour Thyroid 60 mg daily. Follows with Lyn Henri MD.          Musculoskeletal and Integument   Osteoarthritis - Primary    Resume gabapentin 300 mg HS. New Rx sent to pharmacy.      Relevant Medications   gabapentin (NEURONTIN) 300 MG capsule   Osteopenia    Reviewed bone density scan from March 2023. Continue calcium and vitamin D.        Other   Hyperlipidemia    Continue simvastatin 20 mg daily. Repeat lipid panel pending.      Relevant Orders   Comprehensive metabolic panel   Lipid panel   Prediabetes    Discussed the importance of a healthy diet and regular exercise in order for weight loss, and to reduce the risk of further co-morbidity.  Repeat A1C pending.      Relevant Orders   Hemoglobin A1c   Other Visit Diagnoses     Vitamin D deficiency       Relevant Orders   VITAMIN D 25  Hydroxy (Vit-D Deficiency, Fractures)          Pleas Koch, NP

## 2022-10-19 NOTE — Assessment & Plan Note (Signed)
Discussed the importance of a healthy diet and regular exercise in order for weight loss, and to reduce the risk of further co-morbidity. ? ?Repeat A1C pending. ?

## 2022-10-19 NOTE — Assessment & Plan Note (Signed)
Resume gabapentin 300 mg HS. New Rx sent to pharmacy.

## 2022-10-19 NOTE — Assessment & Plan Note (Signed)
Continue simvastatin 20 mg daily. Repeat lipid panel pending. 

## 2022-12-04 IMAGING — US US PELVIS COMPLETE WITH TRANSVAGINAL
1 series · 13 of 25 positions shown · non-contrast
Comparison: Ultrasound from 06/15/2020.

CLINICAL DATA: Initial evaluation for postmenopausal bleeding.
History of fibroids and polyps.

EXAM:
TRANSABDOMINAL AND TRANSVAGINAL ULTRASOUND OF PELVIS
TECHNIQUE: Both transabdominal and transvaginal ultrasound examinations of the
pelvis were performed. Transabdominal technique was performed for
global imaging of the pelvis including uterus, ovaries, adnexal
regions, and pelvic cul-de-sac. It was necessary to proceed with
endovaginal exam following the transabdominal exam to visualize the
endometrium and ovaries.

[Series 1: us pelvis complete with transvaginal · 0.22mm/px · 13 of 93 slices shown]
[im 1/93]
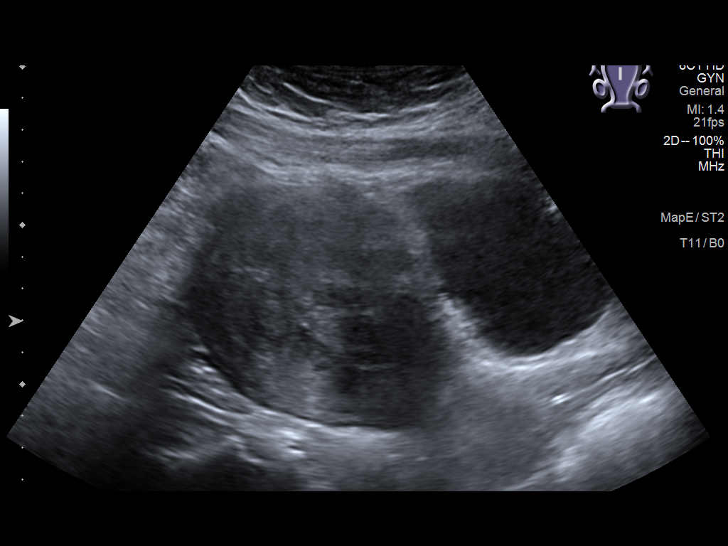
[im 8/93]
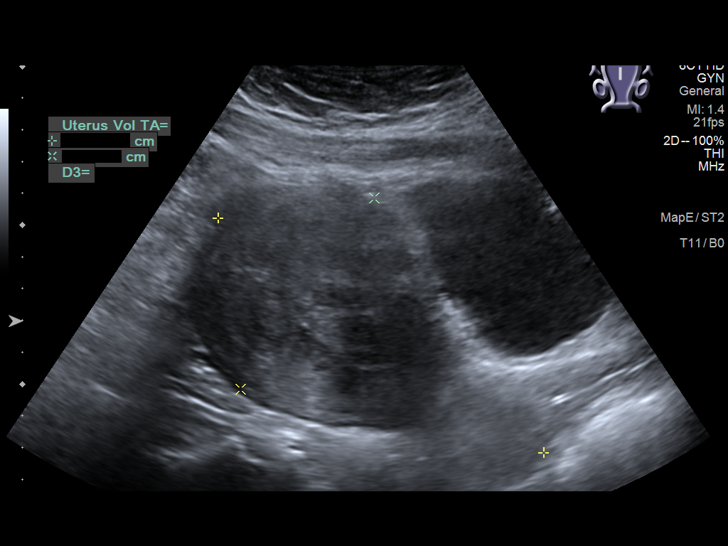
[im 16/93]
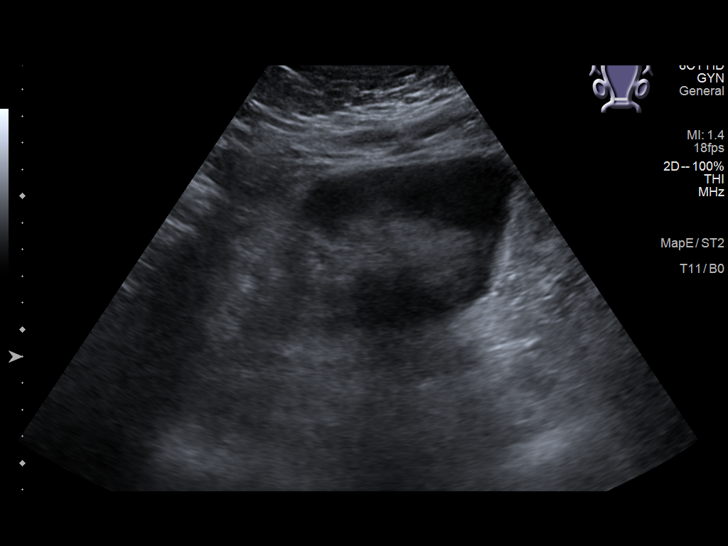
[im 24/93]
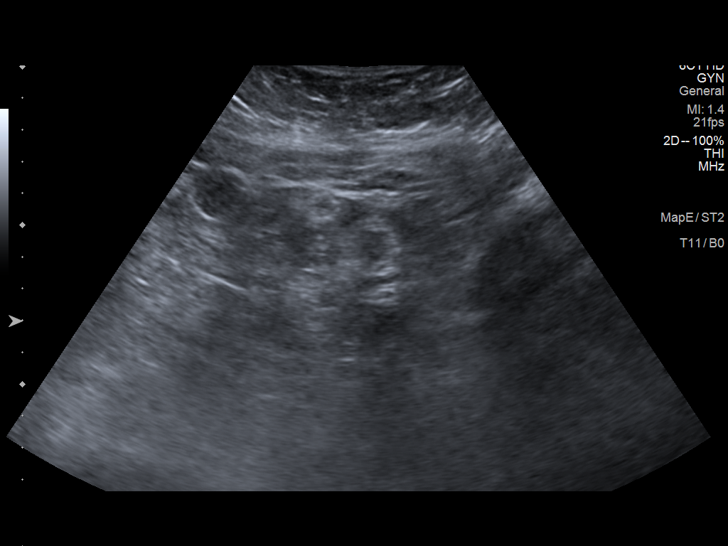
[im 31/93]
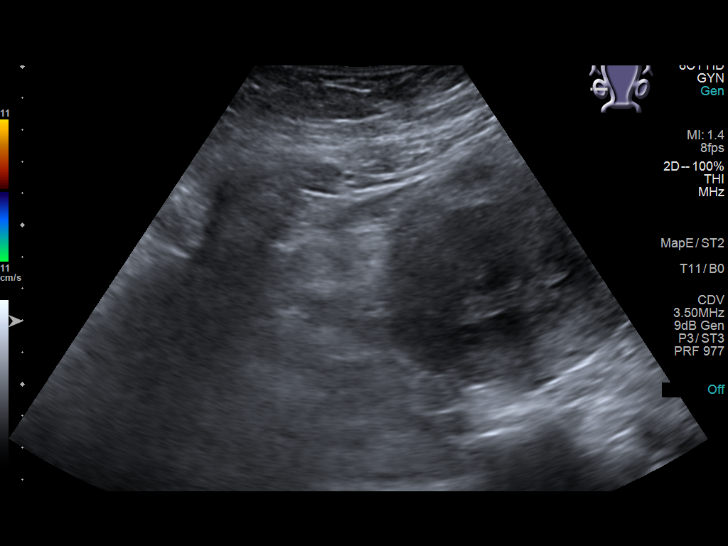
[im 39/93]
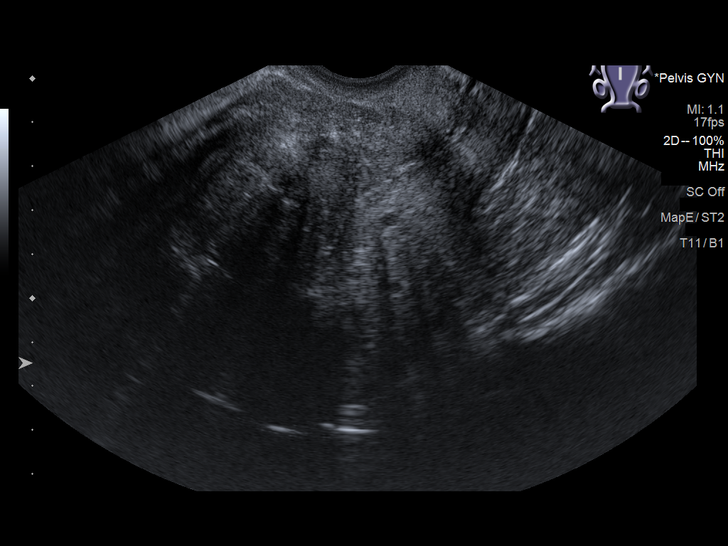
[im 47/93]
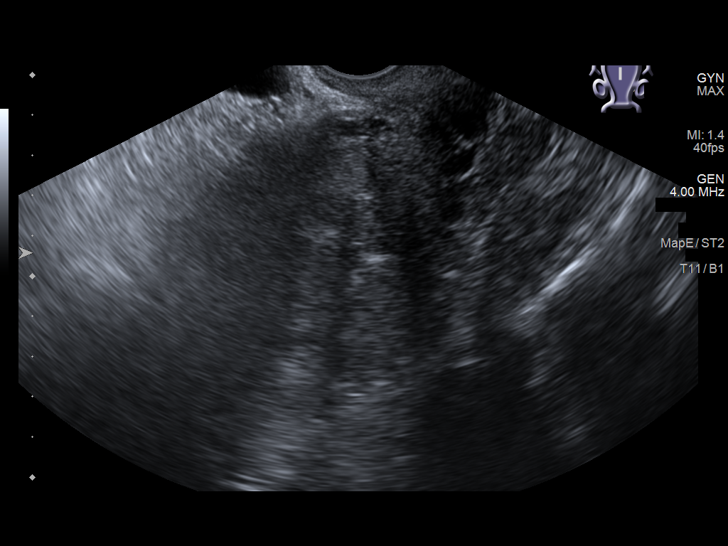
[im 54/93]
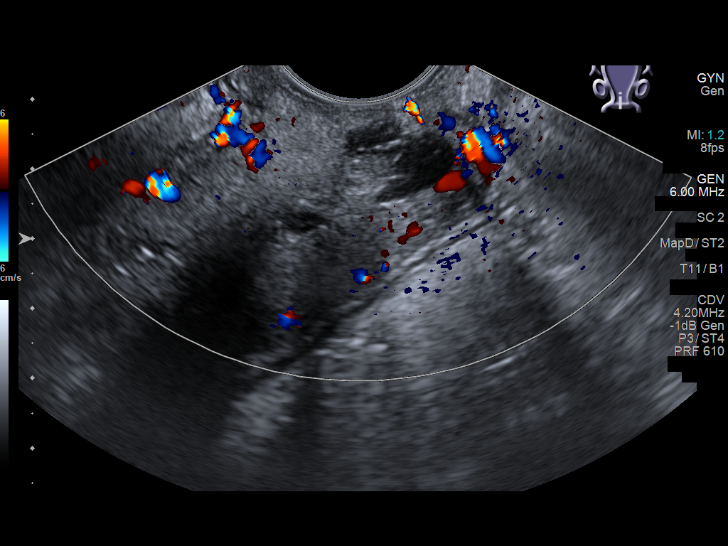
[im 62/93]
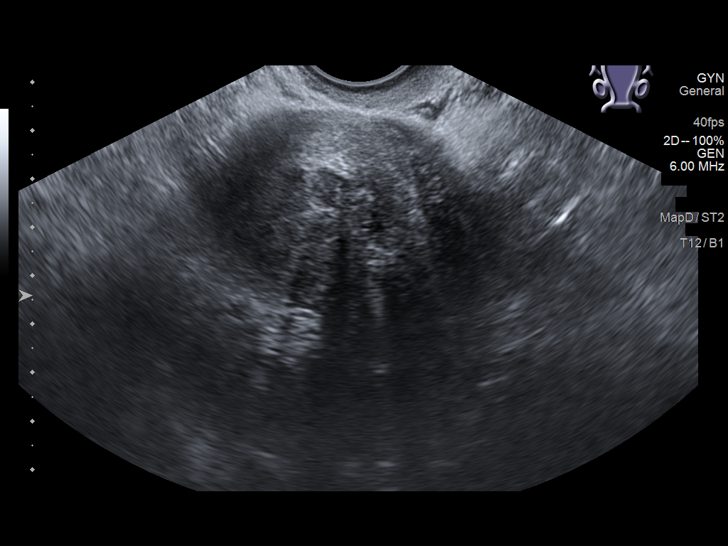
[im 70/93]
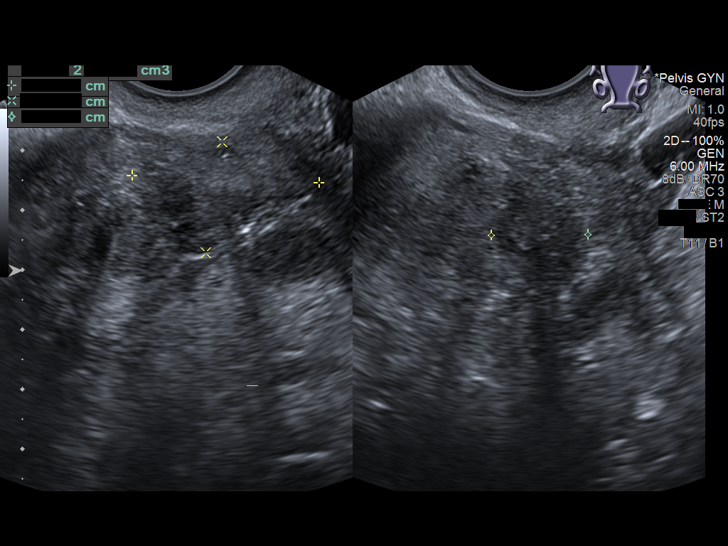
[im 77/93]
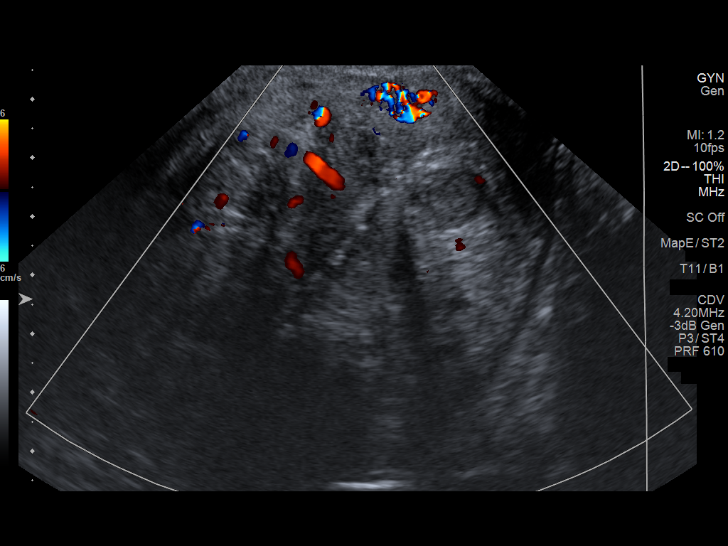
[im 85/93]
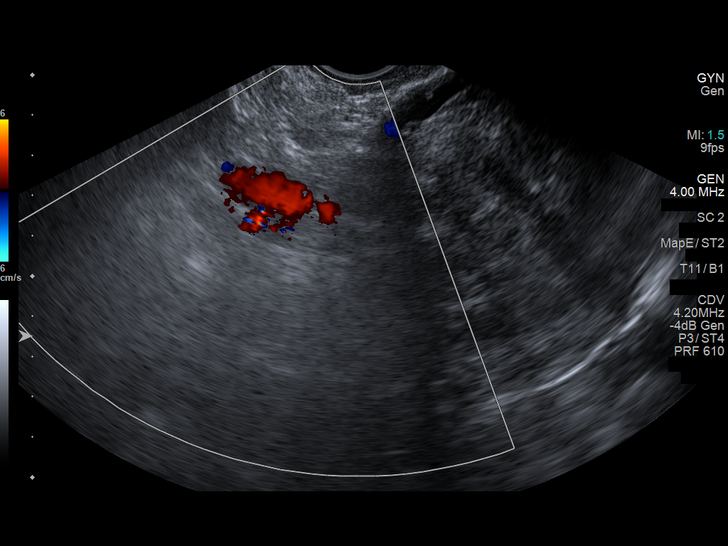
[im 93/93]
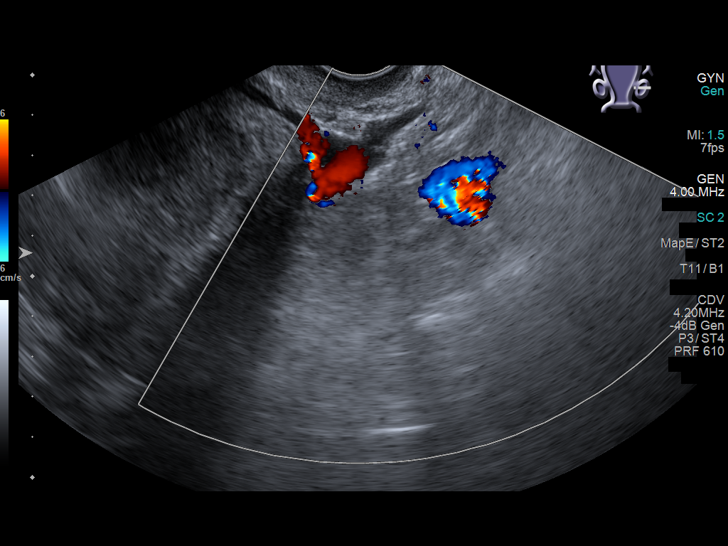

[13 of 25 positions shown; findings below may reference images not displayed]

FINDINGS: Uterus

Measurements: 10.0 x 7.2 x 7.9 cm = volume: 299.2 mL. Uterus is
anteverted. Multiple fibroids are seen, for largest of which are
measured;

1. 3.5 x 2.8 x 3.9 cm intramural to submucosal fibroid present at
the central uterine fundus/body.
2. 3.1 x 1.9 x 1.6 cm intramural to submucosal fibroid present at
the left mid uterine body.
3. 1.8 x 1.5 x 1.7 cm intramural fibroid present at the right
posterior uterine fundus.
4. 1.5 x 1.1 x 1.6 cm intramural fibroid present at the right lower
uterine segment.

Endometrium

Thickness: 8.8 mm. Endometrial stripe difficult to visualize due to
overlying fibroids. No visible polyp or other focal abnormality seen
on today's exam.

Right ovary

Not visualized.  No adnexal mass.

Left ovary

Not visualized.  No adnexal mass.

Other findings

No abnormal free fluid.
IMPRESSION: 1. Enlarged fibroid uterus as detailed above.
2. Endometrial stripe measures 8.8 mm in thickness. No visible
endometrial polyp or other focal abnormality seen on today's exam.
In the setting of post-menopausal bleeding, endometrial sampling is
indicated to exclude carcinoma. If results are benign,
sonohysterogram should be considered for focal lesion work-up. (Ref:
Radiological Reasoning: Algorithmic Workup of Abnormal Vaginal
Bleeding with Endovaginal Sonography and Sonohysterography. AJR
[id number]; 191:S68-73).
3. Nonvisualization of either ovary.  No adnexal mass or free fluid.

## 2022-12-19 ENCOUNTER — Telehealth: Payer: Medicare Other | Admitting: Nurse Practitioner

## 2022-12-19 DIAGNOSIS — K921 Melena: Secondary | ICD-10-CM | POA: Diagnosis not present

## 2022-12-19 NOTE — Progress Notes (Signed)
Virtual Visit Consent   Jamie Koch, you are scheduled for a virtual visit with Mary-Margaret Hassell Done, [place], a [facility] provider, today.     Just as with appointments in the office, your consent must be obtained to participate.  Your consent will be active for this visit and any virtual visit you may have with one of our providers in the next 365 days.     If you have a MyChart account, a copy of this consent can be sent to you electronically.  All virtual visits are billed to your insurance company just like a traditional visit in the office.    As this is a virtual visit, video technology does not allow for your provider to perform a traditional examination.  This may limit your provider's ability to fully assess your condition.  If your provider identifies any concerns that need to be evaluated in person or the need to arrange testing (such as labs, EKG, etc.), we will make arrangements to do so.     Although advances in technology are sophisticated, we cannot ensure that it will always work on either your end or our end.  If the connection with a video visit is poor, the visit may have to be switched to a telephone visit.  With either a video or telephone visit, we are not always able to ensure that we have a secure connection.     I need to obtain your verbal consent now.   Are you willing to proceed with your visit today? YES   Jamie Koch has provided verbal consent on 12/19/2022 for a virtual visit (video or telephone).   Mary-Margaret Hassell Done, FNP   Date: 12/19/2022 11:49 AM   Virtual Visit via Video Note   I, Mary-Margaret Hassell Done, connected with Jamie Koch ([id number], Nov 25, 1955) on 12/19/22 at 12:15 PM EST by a video-enabled telemedicine application and verified that I am speaking with the correct person using two identifiers.  Location: Patient: Virtual Visit Location Patient: Home Provider: Virtual Visit Location Provider: Mobile   I discussed the limitations of  evaluation and management by telemedicine and the availability of in person appointments. The patient expressed understanding and agreed to proceed.    History of Present Illness: Jamie Koch is a 67 y.o. who identifies as a female who was assigned female at birth, and is being seen today for blood in stool.  HPI: Patient says she has had some diarrhea today and she noticed bright red blood in stool. No abdominal pain. She occasional has to strain to have a bowel movement and she knows that she has hemorrhoids.  * last cologuard in 08/2021 and was negative.     Review of Systems  Constitutional: Negative.   Gastrointestinal:  Positive for blood in stool and diarrhea. Negative for abdominal pain.  Genitourinary: Negative.     Problems:  Patient Active Problem List   Diagnosis Date Noted   Acute neck pain 10/15/2021   Osteopenia 10/15/2021   Welcome to Medicare preventive visit 09/25/2020   Prediabetes 07/11/2019   Closed fracture of phalanx of foot 08/05/2017   Right foot injury, initial encounter 05/31/2017   Preventative health care 10/19/2016   Iron deficiency anemia 04/17/2016   Hypothyroidism 04/17/2016   Hyperlipidemia 04/17/2016   Osteoarthritis 04/17/2016   Fracture of head of radius 04/14/2016    Allergies:  Allergies  Allergen Reactions   Covid-19 (Mrna) Vaccine Other (See Comments)    Pfizer covid-19 vaccine. Caused left arm tingling, headaches, burning skin,  vaginal bleeding.   Lactose Intolerance (Gi)     Upset stomach    Medroxyprogesterone Itching   Medications:  Current Outpatient Medications:    Acetaminophen (TYLENOL ARTHRITIS PAIN PO), Take 2 tablets by mouth., Disp: , Rfl:    diphenhydrAMINE (BENADRYL) 25 MG tablet, Take 25 mg by mouth at bedtime as needed for allergies or sleep., Disp: , Rfl:    EVENING PRIMROSE OIL PO, Take 2,000 mg by mouth daily., Disp: , Rfl:    gabapentin (NEURONTIN) 300 MG capsule, Take 1 capsule (300 mg total) by mouth at  bedtime. For pain., Disp: 90 capsule, Rfl: 3   OVER THE COUNTER MEDICATION, Place 2 sprays into both nostrils daily as needed (allergies). Sovereign Silver nasal spray, Disp: , Rfl:    PRESCRIPTION MEDICATION, Testosterone 100 mg pellets placed under the skin every 3 months, Disp: , Rfl:    PRESCRIPTION MEDICATION, Estradiol 6 mg pellets placed under the skin every 3 months, Disp: , Rfl:    progesterone (PROMETRIUM) 100 MG capsule, Take 100 mg by mouth every evening., Disp: , Rfl:    simvastatin (ZOCOR) 40 MG tablet, TAKE 1/2 TABLET(20 MG) BY MOUTH DAILY for cholesterol. Office visit required for further refills., Disp: 45 tablet, Rfl: 0   thyroid (ARMOUR) 60 MG tablet, Take 60 mg by mouth daily before breakfast., Disp: , Rfl:    Vitamin D-Vitamin K (VITAMIN K2-VITAMIN D3 PO), Take 3 tablets by mouth daily., Disp: , Rfl:   Observations/Objective: Patient is well-developed, well-nourished in no acute distress.  Resting comfortably  at home.  Head is normocephalic, atraumatic.  No labored breathing.  Speech is clear and coherent with logical content.  Patient is alert and oriented at baseline.    Assessment and Plan:  [name] in today with chief complaint of Blood In Stools   1. Blood in stool Continue to monitor If continues need to see your pcp. Avoid alll NSAIDS for now  Discussed bright red blood is usually from hemorrhoids    Follow Up Instructions: I discussed the assessment and treatment plan with the patient. The patient was provided an opportunity to ask questions and all were answered. The patient agreed with the plan and demonstrated an understanding of the instructions.  A copy of instructions were sent to the patient via MyChart.  The patient was advised to call back or seek an in-person evaluation if the symptoms worsen or if the condition fails to improve as anticipated.  Time:  I spent 10 minutes with the patient via telehealth technology discussing the  above problems/concerns.    Mary-Margaret Hassell Done, FNP

## 2022-12-19 NOTE — Patient Instructions (Signed)
  Jamie Koch, thank you for joining Chevis Pretty, FNP for today's virtual visit.  While this provider is not your primary care provider (PCP), if your PCP is located in our provider database this encounter information will be shared with them immediately following your visit.   [place] account gives you access to today's visit and all your visits, tests, and labs performed at [facility] " click here if you don't have a [facility] account or go to [email or web address]  Consent: (Patient) Jamie Koch provided verbal consent for this virtual visit at the beginning of the encounter.  Current Medications:  Current Outpatient Medications:    Acetaminophen (TYLENOL ARTHRITIS PAIN PO), Take 2 tablets by mouth., Disp: , Rfl:    diphenhydrAMINE (BENADRYL) 25 MG tablet, Take 25 mg by mouth at bedtime as needed for allergies or sleep., Disp: , Rfl:    EVENING PRIMROSE OIL PO, Take 2,000 mg by mouth daily., Disp: , Rfl:    gabapentin (NEURONTIN) 300 MG capsule, Take 1 capsule (300 mg total) by mouth at bedtime. For pain., Disp: 90 capsule, Rfl: 3   OVER THE COUNTER MEDICATION, Place 2 sprays into both nostrils daily as needed (allergies). Sovereign Silver nasal spray, Disp: , Rfl:    PRESCRIPTION MEDICATION, Testosterone 100 mg pellets placed under the skin every 3 months, Disp: , Rfl:    PRESCRIPTION MEDICATION, Estradiol 6 mg pellets placed under the skin every 3 months, Disp: , Rfl:    progesterone (PROMETRIUM) 100 MG capsule, Take 100 mg by mouth every evening., Disp: , Rfl:    simvastatin (ZOCOR) 40 MG tablet, TAKE 1/2 TABLET(20 MG) BY MOUTH DAILY for cholesterol. Office visit required for further refills., Disp: 45 tablet, Rfl: 0   thyroid (ARMOUR) 60 MG tablet, Take 60 mg by mouth daily before breakfast., Disp: , Rfl:    Vitamin D-Vitamin K (VITAMIN K2-VITAMIN D3 PO), Take 3 tablets by mouth daily., Disp: , Rfl:    Medications ordered in this  encounter:  No orders of the defined types were placed in this encounter.    *If you need refills on other medications prior to your next appointment, please contact your pharmacy*  Follow-Up: Call back or seek an in-person evaluation if the symptoms worsen or if the condition fails to improve as anticipated.  [place] [phone]  Other Instructions See PCP if continues trhough the weekend Avoid all NSAIDS for now   If you have been instructed to have an in-person evaluation today at a local Urgent Care facility, please use the link below. It will take you to a list of all of our available [facility], including address, phone number and hours of operation. Please do not delay care.  [facility]  If you or a family member do not have a primary care provider, use the link below to schedule a visit and establish care. When you choose a [facility] primary care physician or advanced practice provider, you gain a long-term partner in health. Find a Primary Care Provider  Learn more about [facility]'s in-office and virtual care options: [facility]

## 2022-12-21 ENCOUNTER — Encounter: Payer: Self-pay | Admitting: Internal Medicine

## 2022-12-21 ENCOUNTER — Ambulatory Visit (INDEPENDENT_AMBULATORY_CARE_PROVIDER_SITE_OTHER): Payer: Medicare Other | Admitting: Internal Medicine

## 2022-12-21 VITALS — BP 130/72 | HR 97 | Temp 97.5°F | Ht 64.0 in | Wt 209.0 lb

## 2022-12-21 DIAGNOSIS — K625 Hemorrhage of anus and rectum: Secondary | ICD-10-CM | POA: Diagnosis not present

## 2022-12-21 MED ORDER — OMEPRAZOLE 20 MG PO CPDR: 20.0000 mg | DELAYED_RELEASE_CAPSULE | Freq: Every day | ORAL | 3 refills | Status: DC

## 2022-12-21 NOTE — Patient Instructions (Signed)
Stop all ibuprofen Start the omeprazole 75m daily on an empty stomach. Let me know if you are not contacted by the GI specialist by Wednesday morning. Go to the ER if you are passing black or bloody stool (EMS)

## 2022-12-21 NOTE — Progress Notes (Signed)
Subjective:    Patient ID: Jamie Koch, female    DOB: 14-May-1956, 67 y.o.   MRN: [id number]  HPI Here due to blood in stool  Saw red blood in commode and toilet paper 2 days ago This was after 4 loose stools---after spicy meal (dark but not black) Had less yesterday Just gas today--no stool  No stomach pain--but some pain at rectum Did change to witch hazel wipe  Only having broth and yogurt for the past 2 days Appetite is now back  Current Outpatient Medications on File Prior to Visit  Medication Sig Dispense Refill   Acetaminophen (TYLENOL ARTHRITIS PAIN PO) Take 2 tablets by mouth.     diphenhydrAMINE (BENADRYL) 25 MG tablet Take 25 mg by mouth at bedtime as needed for allergies or sleep.     gabapentin (NEURONTIN) 300 MG capsule Take 1 capsule (300 mg total) by mouth at bedtime. For pain. 90 capsule 3   OVER THE COUNTER MEDICATION Place 2 sprays into both nostrils daily as needed (allergies). Sovereign Silver nasal spray     PRESCRIPTION MEDICATION Testosterone 100 mg pellets placed under the skin every 3 months     PRESCRIPTION MEDICATION Estradiol 6 mg pellets placed under the skin every 3 months     progesterone (PROMETRIUM) 100 MG capsule Take 100 mg by mouth every evening.     simvastatin (ZOCOR) 40 MG tablet TAKE 1/2 TABLET(20 MG) BY MOUTH DAILY for cholesterol. Office visit required for further refills. 45 tablet 0   thyroid (ARMOUR) 60 MG tablet Take 60 mg by mouth daily before breakfast.     Vitamin D-Vitamin K (VITAMIN K2-VITAMIN D3 PO) Take 3 tablets by mouth daily.     EVENING PRIMROSE OIL PO Take 2,000 mg by mouth daily. (Patient not taking: Reported on 12/21/2022)     No current facility-administered medications on file prior to visit.    Allergies  Allergen Reactions   Covid-19 (Mrna) Vaccine Other (See Comments)    Pfizer covid-19 vaccine. Caused left arm tingling, headaches, burning skin, vaginal bleeding.   Lactose Intolerance (Gi)     Upset stomach     Medroxyprogesterone Itching    Past Medical History:  Diagnosis Date   Arthritis    Chicken pox    Depression    Eye irritation 07/17/2020   Hay fever    High cholesterol    Hypothyroidism    Osteoporosis    PMB (postmenopausal bleeding) 07/17/2020   Thyroid dysfunction     Past Surgical History:  Procedure Laterality Date   DILATATION & CURETTAGE/HYSTEROSCOPY WITH MYOSURE N/A 07/29/2020   Procedure: FRACTIONAL DILATATION & CURETTAGE/HYSTEROSCOPY WITH MYOSURE RESECTION OF ENDOMETRIAL POLYP;  Surgeon: Boykin Nearing, MD;  Location: [facility] ORS;  Service: Gynecology;  Laterality: N/A;   LAPAROSCOPIC TOTAL HYSTERECTOMY  07/02/2022   PARTIAL KNEE ARTHROPLASTY Left    TONSILLECTOMY AND ADENOIDECTOMY  1964   TOTAL KNEE ARTHROPLASTY Right     History reviewed. No pertinent family history.  Social History   Socioeconomic History   Marital status: Married    Spouse name: Not on file   Number of children: Not on file   Years of education: Not on file   Highest education level: Not on file  Occupational History   Not on file  Tobacco Use   Smoking status: Never   Smokeless tobacco: Never  Vaping Use   Vaping Use: Never used  Substance and Sexual Activity   Alcohol use: No   Drug use: No  Sexual activity: Yes  Other Topics Concern   Not on file  Social History Narrative   Married.   1 child.   Retired. Worked in Insurance claims handler.    Enjoys reading, gardening.    Social Determinants of Health   Financial Resource Strain: Low Risk  (02/03/2022)   Overall Financial Resource Strain (CARDIA)    Difficulty of Paying Living Expenses: Not hard at all  Food Insecurity: No Food Insecurity (02/03/2022)   Hunger Vital Sign    Worried About Running Out of Food in the Last Year: Never true    Ran Out of Food in the Last Year: Never true  Transportation Needs: No Transportation Needs (02/03/2022)   PRAPARE - Hydrologist (Medical): No    Lack  of Transportation (Non-Medical): No  Physical Activity: Inactive (02/03/2022)   Exercise Vital Sign    Days of Exercise per Week: 0 days    Minutes of Exercise per Session: 0 min  Stress: No Stress Concern Present (02/03/2022)   [place]    Feeling of Stress : Not at all  Social Connections: Moderately Integrated (02/03/2022)   Social Connection and Isolation Panel [NHANES]    Frequency of Communication with Friends and Family: More than three times a week    Frequency of Social Gatherings with Friends and Family: Never    Attends Religious Services: More than 4 times per year    Active Member of [employer] or Organizations: No    Attends Archivist Meetings: Never    Marital Status: Married  Human resources officer Violence: Not At Risk (02/03/2022)   Humiliation, Afraid, Rape, and Kick questionnaire    Fear of Current or Ex-Partner: No    Emotionally Abused: No    Physically Abused: No    Sexually Abused: No   Review of Systems No fever  Doesn't feel sick Colon cancer screening with Cologuard---last 2 years ago (always negative)     Objective:   Physical Exam Constitutional:      Appearance: Normal appearance.  Abdominal:     Palpations: Abdomen is soft.     Tenderness: There is no abdominal tenderness.  Genitourinary:    Comments: No significant hemorrhoids  Very slight perirectal redness/irritation No rectal masses---stool is melenotic and has obvious blood Neurological:     Mental Status: She is alert.            Assessment & Plan:

## 2022-12-21 NOTE — Assessment & Plan Note (Signed)
History suggestive of perirectal source but has actual melena on exam No abdominal pain or GI symptoms other than the red blood Does take ibuprofen-- 636m prn (probably 4-5 doses in past 1- 2 weeks)  Will start omeprazole 250mdaily Urgent GI evaluation---to ER if passes black stool Will likely need EGD and colon

## 2022-12-22 ENCOUNTER — Inpatient Hospital Stay
Admission: EM | Admit: 2022-12-22 | Discharge: 2022-12-25 | DRG: 378 | Disposition: A | Discharge status: Home / Self Care | Payer: Medicare Other | Attending: Family Medicine | Admitting: Family Medicine

## 2022-12-22 DIAGNOSIS — D62 Acute posthemorrhagic anemia: Secondary | ICD-10-CM | POA: Diagnosis present

## 2022-12-22 DIAGNOSIS — K219 Gastro-esophageal reflux disease without esophagitis: Secondary | ICD-10-CM | POA: Insufficient documentation

## 2022-12-22 DIAGNOSIS — E739 Lactose intolerance, unspecified: Secondary | ICD-10-CM | POA: Diagnosis present

## 2022-12-22 DIAGNOSIS — F32A Depression, unspecified: Secondary | ICD-10-CM | POA: Diagnosis present

## 2022-12-22 DIAGNOSIS — K449 Diaphragmatic hernia without obstruction or gangrene: Secondary | ICD-10-CM | POA: Diagnosis not present

## 2022-12-22 DIAGNOSIS — Z79890 Hormone replacement therapy (postmenopausal): Secondary | ICD-10-CM

## 2022-12-22 DIAGNOSIS — E039 Hypothyroidism, unspecified: Secondary | ICD-10-CM | POA: Diagnosis present

## 2022-12-22 DIAGNOSIS — K254 Chronic or unspecified gastric ulcer with hemorrhage: Secondary | ICD-10-CM | POA: Diagnosis present

## 2022-12-22 DIAGNOSIS — K921 Melena: Secondary | ICD-10-CM | POA: Diagnosis not present

## 2022-12-22 DIAGNOSIS — K259 Gastric ulcer, unspecified as acute or chronic, without hemorrhage or perforation: Secondary | ICD-10-CM | POA: Diagnosis not present

## 2022-12-22 DIAGNOSIS — K5731 Diverticulosis of large intestine without perforation or abscess with bleeding: Principal | ICD-10-CM | POA: Diagnosis present

## 2022-12-22 DIAGNOSIS — Z79899 Other long term (current) drug therapy: Secondary | ICD-10-CM | POA: Diagnosis not present

## 2022-12-22 DIAGNOSIS — E785 Hyperlipidemia, unspecified: Secondary | ICD-10-CM | POA: Diagnosis not present

## 2022-12-22 DIAGNOSIS — K5791 Diverticulosis of intestine, part unspecified, without perforation or abscess with bleeding: Secondary | ICD-10-CM | POA: Insufficient documentation

## 2022-12-22 DIAGNOSIS — Z888 Allergy status to other drugs, medicaments and biological substances status: Secondary | ICD-10-CM

## 2022-12-22 DIAGNOSIS — Z90710 Acquired absence of both cervix and uterus: Secondary | ICD-10-CM | POA: Diagnosis not present

## 2022-12-22 DIAGNOSIS — M810 Age-related osteoporosis without current pathological fracture: Secondary | ICD-10-CM | POA: Diagnosis present

## 2022-12-22 DIAGNOSIS — K3189 Other diseases of stomach and duodenum: Secondary | ICD-10-CM | POA: Diagnosis not present

## 2022-12-22 DIAGNOSIS — Z887 Allergy status to serum and vaccine status: Secondary | ICD-10-CM

## 2022-12-22 DIAGNOSIS — E7800 Pure hypercholesterolemia, unspecified: Secondary | ICD-10-CM | POA: Diagnosis present

## 2022-12-22 DIAGNOSIS — Z96653 Presence of artificial knee joint, bilateral: Secondary | ICD-10-CM | POA: Diagnosis present

## 2022-12-22 DIAGNOSIS — G629 Polyneuropathy, unspecified: Secondary | ICD-10-CM

## 2022-12-22 DIAGNOSIS — Z791 Long term (current) use of non-steroidal anti-inflammatories (NSAID): Secondary | ICD-10-CM | POA: Diagnosis not present

## 2022-12-22 DIAGNOSIS — E876 Hypokalemia: Secondary | ICD-10-CM | POA: Diagnosis present

## 2022-12-22 DIAGNOSIS — K922 Gastrointestinal hemorrhage, unspecified: Secondary | ICD-10-CM | POA: Diagnosis present

## 2022-12-22 DIAGNOSIS — K625 Hemorrhage of anus and rectum: Principal | ICD-10-CM

## 2022-12-22 HISTORY — DX: Gastrointestinal hemorrhage, unspecified: K92.2

## 2022-12-22 LAB — COMPREHENSIVE METABOLIC PANEL
ALT: 24 U/L (ref 0–44)
AST: 26 U/L (ref 15–41)
Albumin: 4 g/dL (ref 3.5–5.0)
Alkaline Phosphatase: 43 U/L (ref 38–126)
Anion gap: 9 (ref 5–15)
BUN: 19 mg/dL (ref 8–23)
CO2: 26 mmol/L (ref 22–32)
Calcium: 9 mg/dL (ref 8.9–10.3)
Chloride: 102 mmol/L (ref 98–111)
Creatinine, Ser: 0.82 mg/dL (ref 0.44–1.00)
GFR, Estimated: >60 mL/min (ref >60)
Glucose, Bld: 144 mg/dL — ABNORMAL HIGH (ref 70–99)
Potassium: 3.4 mmol/L — ABNORMAL LOW (ref 3.5–5.1)
Sodium: 137 mmol/L (ref 135–145)
Total Bilirubin: 0.5 mg/dL (ref 0.3–1.2)
Total Protein: 6.4 g/dL — ABNORMAL LOW (ref 6.5–8.1)

## 2022-12-22 LAB — CBC
HCT: 32.8 % — ABNORMAL LOW (ref 36.0–46.0)
Hemoglobin: 11.1 g/dL — ABNORMAL LOW (ref 12.0–15.0)
MCH: 30.4 pg (ref 26.0–34.0)
MCHC: 33.8 g/dL (ref 30.0–36.0)
MCV: 89.9 fL (ref 80.0–100.0)
Platelets: 302 10*3/uL (ref 150–400)
RBC: 3.65 MIL/uL — ABNORMAL LOW (ref 3.87–5.11)
RDW: 12.3 % (ref 11.5–15.5)
WBC: 10.6 10*3/uL — ABNORMAL HIGH (ref 4.0–10.5)
nRBC: 0 % (ref 0.0–0.2)

## 2022-12-22 LAB — TYPE AND SCREEN
ABO/RH(D): A NEG
Antibody Screen: NEGATIVE

## 2022-12-22 NOTE — ED Triage Notes (Signed)
Pt sts that she has been having dark red stool for the past four days.

## 2022-12-22 NOTE — ED Provider Notes (Signed)
[facility] Provider Note    Event Date/Time   First MD Initiated Contact with Patient 12/22/22 2306     (approximate)   History   Blood In Stools   HPI  Jamie Koch is a 67 y.o. female with history of hypothyroidism, hyperlipidemia who presents to the emergency department complaints of rectal bleeding for the past 4 days.  States she has 3-4 episodes a day that are mixed with stool.  No abdominal pain.  States initially stool was red/maroon-colored but appeared black today.  No vomiting.  Has never had a colonoscopy but states she has done Cologuard.  She is not on blood thinners.   History provided by patient and husband.    Past Medical History:  Diagnosis Date   Arthritis    Chicken pox    Depression    Eye irritation 07/17/2020   Hay fever    High cholesterol    Hypothyroidism    Osteoporosis    PMB (postmenopausal bleeding) 07/17/2020   Thyroid dysfunction     Past Surgical History:  Procedure Laterality Date   DILATATION & CURETTAGE/HYSTEROSCOPY WITH MYOSURE N/A 07/29/2020   Procedure: FRACTIONAL DILATATION & CURETTAGE/HYSTEROSCOPY WITH MYOSURE RESECTION OF ENDOMETRIAL POLYP;  Surgeon: Boykin Nearing, MD;  Location: [facility] ORS;  Service: Gynecology;  Laterality: N/A;   LAPAROSCOPIC TOTAL HYSTERECTOMY  07/02/2022   PARTIAL KNEE ARTHROPLASTY Left    TONSILLECTOMY AND ADENOIDECTOMY  1964   TOTAL KNEE ARTHROPLASTY Right     MEDICATIONS:  Prior to Admission medications   Medication Sig Start Date End Date Taking? Authorizing Provider  Acetaminophen (TYLENOL ARTHRITIS PAIN PO) Take 2 tablets by mouth.    [provider]  diphenhydrAMINE (BENADRYL) 25 MG tablet Take 25 mg by mouth at bedtime as needed for allergies or sleep.    [provider]  EVENING PRIMROSE OIL PO Take 2,000 mg by mouth daily. Patient not taking: Reported on 12/21/2022    [provider]  gabapentin (NEURONTIN) 300 MG capsule Take 1  capsule (300 mg total) by mouth at bedtime. For pain. 10/19/22   Pleas Koch, NP  omeprazole (PRILOSEC) 20 MG capsule Take 1 capsule (20 mg total) by mouth daily. 12/21/22   Venia Carbon, MD  OVER THE COUNTER MEDICATION Place 2 sprays into both nostrils daily as needed (allergies). Sovereign Silver nasal spray    [provider]  PRESCRIPTION MEDICATION Testosterone 100 mg pellets placed under the skin every 3 months    [provider]  PRESCRIPTION MEDICATION Estradiol 6 mg pellets placed under the skin every 3 months    [provider]  progesterone (PROMETRIUM) 100 MG capsule Take 100 mg by mouth every evening.    [provider]  simvastatin (ZOCOR) 40 MG tablet TAKE 1/2 TABLET(20 MG) BY MOUTH DAILY for cholesterol. Office visit required for further refills. 09/20/22   Pleas Koch, NP  thyroid (ARMOUR) 60 MG tablet Take 60 mg by mouth daily before breakfast.    [provider]  Vitamin D-Vitamin K (VITAMIN K2-VITAMIN D3 PO) Take 3 tablets by mouth daily.    [provider]    Physical Exam   Triage Vital Signs: ED Triage Vitals  Enc Vitals Group     BP 12/22/22 2300 135/61     Pulse Rate 12/22/22 2300 97     Resp 12/22/22 2300 16     Temp 12/22/22 2320 98.5 F (36.9 C)     Temp Source 12/22/22  2320 Oral     SpO2 12/22/22 2300 98 %     Weight 12/22/22 2227 209 lb (94.8 kg)     Height --      Head Circumference --      Peak Flow --      Pain Score 12/22/22 2227 1     Pain Loc --      Pain Edu? --      Excl. in [place]? --     Most recent vital signs: Vitals:   12/22/22 2300 12/22/22 2320  BP: 135/61   Pulse: 97   Resp: 16   Temp:  98.5 F (36.9 C)  SpO2: 98%     CONSTITUTIONAL: Alert, responds appropriately to questions. Well-appearing; well-nourished HEAD: Normocephalic, atraumatic EYES: Conjunctivae clear, pupils appear equal, sclera nonicteric ENT: normal nose; moist mucous membranes NECK: Supple,  normal ROM CARD: RRR; S1 and S2 appreciated RESP: Normal chest excursion without splinting or tachypnea; breath sounds clear and equal bilaterally; no wheezes, no rhonchi, no rales, no hypoxia or respiratory distress, speaking full sentences ABD/GI: Non-distended; soft, non-tender, no rebound, no guarding, no peritoneal signs RECTAL:  Normal rectal tone, large amount of gross maroon-colored blood on rectal exam, no melena, 1 nonthrombosed external hemorrhoid noted, nontender rectal exam, no fecal impaction. Chaperone present. BACK: The back appears normal EXT: Normal ROM in all joints; no deformity noted, no edema SKIN: Normal color for age and race; warm; no rash on exposed skin NEURO: Moves all extremities equally, normal speech PSYCH: The patient's mood and manner are appropriate.   ED Results / Procedures / Treatments   LABS: (all labs ordered are listed, but only abnormal results are displayed) Labs Reviewed  COMPREHENSIVE METABOLIC PANEL - Abnormal; Notable for the following components:      Result Value   Potassium 3.4 (*)    Glucose, Bld 144 (*)    Total Protein 6.4 (*)    All other components within normal limits  CBC - Abnormal; Notable for the following components:   WBC 10.6 (*)    RBC 3.65 (*)    Hemoglobin 11.1 (*)    HCT 32.8 (*)    All other components within normal limits  POC OCCULT BLOOD, ED  TYPE AND SCREEN     EKG:   RADIOLOGY: My personal review and interpretation of imaging:    I have personally reviewed all radiology reports.   No results found.   PROCEDURES:  Critical Care performed: No   .1-3 Lead EKG Interpretation  Performed by: Collier Monica, Delice Bison, DO Authorized by: Nelline Lio, Delice Bison, DO     Interpretation: normal     ECG rate:  97   ECG rate assessment: normal     Rhythm: sinus rhythm     Ectopy: none     Conduction: normal       IMPRESSION / MDM / ASSESSMENT AND PLAN / ED COURSE  I reviewed the triage vital signs and the nursing  notes.    Patient here with rectal bleeding.  The patient is on the cardiac monitor to evaluate for evidence of arrhythmia and/or significant heart rate changes.   DIFFERENTIAL DIAGNOSIS (includes but not limited to):   Diverticular bleed, lower GI bleed, doubt colitis, diverticulitis   Patient's presentation is most consistent with acute presentation with potential threat to life or bodily function.   PLAN: Labs obtained from triage.  Hemoglobin is 11.1 down 15.9 in August 2023 and 14.5 in December 2022.  Normal platelets.  Not on blood thinners.  Normal electrolytes, creatinine.  Will admit for lower GI bleed for further monitoring.  Discussed with patient given amount of bleeding I suspect that this is a diverticular bleed.  Hemodynamically stable here.   MEDICATIONS GIVEN IN ED: Medications - No data to display   ED COURSE:  Consulted and discussed patient's case with hospitalist, Dr. Sidney Ace.  I have recommended admission and consulting physician agrees and will place admission orders.  Patient (and family if present) agree with this plan.   I reviewed all nursing notes, vitals, pertinent previous records.  All labs, EKGs, imaging ordered have been independently reviewed and interpreted by myself.      OUTSIDE RECORDS REVIEWED: Reviewed her last hematology note in November 2023 at [facility].  Last hemoglobin on 06/25/2022 was 15.9.       FINAL CLINICAL IMPRESSION(S) / ED DIAGNOSES   Final diagnoses:  Rectal bleeding     Rx / DC Orders   ED Discharge Orders     None        Note:  This document was prepared using Dragon voice recognition software and may include unintentional dictation errors.   Gerilynn Mccullars, Delice Bison, DO 12/23/22 0000

## 2022-12-22 NOTE — H&P (Signed)
[place]   PATIENT NAME: Jamie Koch    MR#:  [id number]  DATE OF BIRTH:  [birth year]  DATE OF ADMISSION:  12/22/2022  PRIMARY CARE PHYSICIAN: Pleas Koch, NP   Patient is coming from: Home  REQUESTING/REFERRING PHYSICIAN: Ward, Delice Bison, DO  CHIEF COMPLAINT:   Chief Complaint  Patient presents with   Blood In Stools    HISTORY OF PRESENT ILLNESS:  Jamie Koch is a 67 y.o. female with medical history significant for depression, hypothyroidism, dyslipidemia, and osteoarthritis, who presented to the emergency room with acute onset of melena.  The patient noted black tarry stools once.  She has been having diarrhea which has been bloody and intermittent since Saturday.  She denies any abdominal pain.  No nausea or vomiting or heartburn.  No cough or wheezing or dyspnea.  No dysuria, oliguria or hematuria or flank pain.  She admitted to palpitations without chest pain.  No fever or chills.  ED Course: When she came to the ER, temperature was 97.5 with normal other vital signs.  Labs revealed mild hypokalemia of 3.4 and blood glucose of 144 with total protein of 6.4 albumin of 4.  CBC showed WBC of 10.6 and mild anemia with hemoglobin 11.1 hematocrit 32.8 compared to 14.5 and 42.8 on 10/15/2021.  Blood group was A- with negative antibody screen.  Imaging: None.  The patient will be admitted to a medical telemetry bed for further evaluation and management. PAST MEDICAL HISTORY:   Past Medical History:  Diagnosis Date   Arthritis    Chicken pox    Depression    Eye irritation 07/17/2020   Hay fever    High cholesterol    Hypothyroidism    Osteoporosis    PMB (postmenopausal bleeding) 07/17/2020   Thyroid dysfunction     PAST SURGICAL HISTORY:   Past Surgical History:  Procedure Laterality Date   DILATATION & CURETTAGE/HYSTEROSCOPY WITH MYOSURE N/A 07/29/2020   Procedure: FRACTIONAL DILATATION & CURETTAGE/HYSTEROSCOPY WITH MYOSURE RESECTION OF ENDOMETRIAL  POLYP;  Surgeon: Boykin Nearing, MD;  Location: [facility] ORS;  Service: Gynecology;  Laterality: N/A;   LAPAROSCOPIC TOTAL HYSTERECTOMY  07/02/2022   PARTIAL KNEE ARTHROPLASTY Left    TONSILLECTOMY AND ADENOIDECTOMY  1964   TOTAL KNEE ARTHROPLASTY Right     SOCIAL HISTORY:   Social History   Tobacco Use   Smoking status: Never   Smokeless tobacco: Never  Substance Use Topics   Alcohol use: No    FAMILY HISTORY:  No family history on file.  DRUG ALLERGIES:   Allergies  Allergen Reactions   Covid-19 (Mrna) Vaccine Other (See Comments)    Pfizer covid-19 vaccine. Caused left arm tingling, headaches, burning skin, vaginal bleeding.   Lactose Intolerance (Gi)     Upset stomach    Medroxyprogesterone Itching    REVIEW OF SYSTEMS:   ROS As per history of present illness. All pertinent systems were reviewed above. Constitutional, HEENT, cardiovascular, respiratory, GI, GU, musculoskeletal, neuro, psychiatric, endocrine, integumentary and hematologic systems were reviewed and are otherwise negative/unremarkable except for positive findings mentioned above in the HPI.   MEDICATIONS AT HOME:   Prior to Admission medications   Medication Sig Start Date End Date Taking? Authorizing Provider  Acetaminophen (TYLENOL ARTHRITIS PAIN PO) Take 2 tablets by mouth.    [provider]  diphenhydrAMINE (BENADRYL) 25 MG tablet Take 25 mg by mouth at bedtime as needed for allergies or sleep.    [provider]  EVENING PRIMROSE OIL PO Take 2,000 mg by mouth daily. Patient not taking: Reported on 12/21/2022    [provider]  gabapentin (NEURONTIN) 300 MG capsule Take 1 capsule (300 mg total) by mouth at bedtime. For pain. 10/19/22   Pleas Koch, NP  omeprazole (PRILOSEC) 20 MG capsule Take 1 capsule (20 mg total) by mouth daily. 12/21/22   Venia Carbon, MD  OVER THE COUNTER MEDICATION Place 2 sprays into both nostrils daily as needed (allergies).  Sovereign Silver nasal spray    [provider]  PRESCRIPTION MEDICATION Testosterone 100 mg pellets placed under the skin every 3 months    [provider]  PRESCRIPTION MEDICATION Estradiol 6 mg pellets placed under the skin every 3 months    [provider]  progesterone (PROMETRIUM) 100 MG capsule Take 100 mg by mouth every evening.    [provider]  simvastatin (ZOCOR) 40 MG tablet TAKE 1/2 TABLET(20 MG) BY MOUTH DAILY for cholesterol. Office visit required for further refills. 09/20/22   Pleas Koch, NP  thyroid (ARMOUR) 60 MG tablet Take 60 mg by mouth daily before breakfast.    [provider]  Vitamin D-Vitamin K (VITAMIN K2-VITAMIN D3 PO) Take 3 tablets by mouth daily.    [provider]      VITAL SIGNS:  Blood pressure 97/65, pulse 97, temperature 98.5 F (36.9 C), temperature source Oral, resp. rate 17, weight 94.8 kg, SpO2 96 %.  PHYSICAL EXAMINATION:  Physical Exam  GENERAL:  67 y.o.-year-old Caucasian female patient lying in the bed with no acute distress.  EYES: Pupils equal, round, reactive to light and accommodation. No scleral icterus. Extraocular muscles intact.  HEENT: Head atraumatic, normocephalic. Oropharynx and nasopharynx clear.  NECK:  Supple, no jugular venous distention. No thyroid enlargement, no tenderness.  LUNGS: Normal breath sounds bilaterally, no wheezing, rales,rhonchi or crepitation. No use of accessory muscles of respiration.  CARDIOVASCULAR: Regular rate and rhythm, S1, S2 normal. No murmurs, rubs, or gallops.  ABDOMEN: Soft, nondistended, nontender. Bowel sounds present. No organomegaly or mass.  Rectal exam done by EDP earlier revealed normal rectal tone with large amount of gross maroon-colored blood on rectal exam with no melena.  It showed nonthrombosed external hemorrhoid with no tenderness or fecal impaction. EXTREMITIES: No pedal edema, cyanosis, or clubbing.  NEUROLOGIC: Cranial  nerves II through XII are intact. Muscle strength 5/5 in all extremities. Sensation intact. Gait not checked.  PSYCHIATRIC: The patient is alert and oriented x 3.  Normal affect and good eye contact. SKIN: No obvious rash, lesion, or ulcer.   LABORATORY PANEL:   CBC Recent Labs  Lab 12/22/22 2229  WBC 10.6*  HGB 11.1*  HCT 32.8*  PLT 302   ------------------------------------------------------------------------------------------------------------------  Chemistries  Recent Labs  Lab 12/22/22 2229  NA 137  K 3.4*  CL 102  CO2 26  GLUCOSE 144*  BUN 19  CREATININE 0.82  CALCIUM 9.0  AST 26  ALT 24  ALKPHOS 43  BILITOT 0.5   ------------------------------------------------------------------------------------------------------------------  Cardiac Enzymes No results for input(s): "TROPONINI" in the last 168 hours. ------------------------------------------------------------------------------------------------------------------  RADIOLOGY:  No results found.    IMPRESSION AND PLAN:  Assessment and Plan: * GI bleeding - The patient be admitted to a medical telemetry bed. - We will follow serial hemoglobins and hematocrits. - She will be kept NPO. - She will be hydrated with IV normal saline. - GI consult will be obtained. - I notified Dr. Allen Norris about the patient.  Anemia due to acute blood loss - The patient was typed and crossmatched.  This is likely secondary to GI bleeding. - At this time he does not need to be transfused. - We will follow serial hemoglobins and hematocrits.  Hypothyroidism - We will continue thyroid replacement therapy.  Dyslipidemia - We will continue statin therapy.  Peripheral neuropathy - We will continue Neurontin.  GERD without esophagitis - We will continue PPI therapy.    DVT prophylaxis: SCDs.  Advanced Care Planning:  Code Status: full code.  Family Communication:  The plan of care was discussed in details with the  patient (and family). I answered all questions. The patient agreed to proceed with the above mentioned plan. Further management will depend upon hospital course. Disposition Plan: Back to previous home environment Consults called: Gastroenterology. All the records are reviewed and case discussed with ED provider.  Status is: Inpatient    At the time of the admission, it appears that the appropriate admission status for this patient is inpatient.  This is judged to be reasonable and necessary in order to provide the required intensity of service to ensure the patient's safety given the presenting symptoms, physical exam findings and initial radiographic and laboratory data in the context of comorbid conditions.  The patient requires inpatient status due to high intensity of service, high risk of further deterioration and high frequency of surveillance required.  I certify that at the time of admission, it is my clinical judgment that the patient will require inpatient hospital care extending more than 2 midnights.                            Dispo: The patient is from: Home              Anticipated d/c is to: Home              Patient currently is not medically stable to d/c.              Difficult to place patient: No  Christel Mormon M.D on 12/23/2022 at 1:45 AM  [facility]   From 7 PM-7 AM, contact night-coverage [email or web address]  CC: Primary care physician; Pleas Koch, NP

## 2022-12-23 ENCOUNTER — Other Ambulatory Visit: Payer: Self-pay

## 2022-12-23 ENCOUNTER — Encounter: Payer: Self-pay | Admitting: Family Medicine

## 2022-12-23 DIAGNOSIS — D62 Acute posthemorrhagic anemia: Secondary | ICD-10-CM

## 2022-12-23 DIAGNOSIS — K921 Melena: Secondary | ICD-10-CM | POA: Diagnosis not present

## 2022-12-23 DIAGNOSIS — K219 Gastro-esophageal reflux disease without esophagitis: Secondary | ICD-10-CM | POA: Insufficient documentation

## 2022-12-23 DIAGNOSIS — E785 Hyperlipidemia, unspecified: Secondary | ICD-10-CM

## 2022-12-23 DIAGNOSIS — Z791 Long term (current) use of non-steroidal anti-inflammatories (NSAID): Secondary | ICD-10-CM | POA: Diagnosis not present

## 2022-12-23 DIAGNOSIS — K625 Hemorrhage of anus and rectum: Secondary | ICD-10-CM | POA: Diagnosis not present

## 2022-12-23 DIAGNOSIS — G629 Polyneuropathy, unspecified: Secondary | ICD-10-CM

## 2022-12-23 HISTORY — DX: Acute posthemorrhagic anemia: D62

## 2022-12-23 LAB — MAGNESIUM: Magnesium: 2.1 mg/dL (ref 1.7–2.4)

## 2022-12-23 LAB — BASIC METABOLIC PANEL
Anion gap: 7 (ref 5–15)
BUN: 16 mg/dL (ref 8–23)
CO2: 26 mmol/L (ref 22–32)
Calcium: 8.5 mg/dL — ABNORMAL LOW (ref 8.9–10.3)
Chloride: 107 mmol/L (ref 98–111)
Creatinine, Ser: 0.73 mg/dL (ref 0.44–1.00)
GFR, Estimated: >60 mL/min (ref >60)
Glucose, Bld: 116 mg/dL — ABNORMAL HIGH (ref 70–99)
Potassium: 3.6 mmol/L (ref 3.5–5.1)
Sodium: 140 mmol/L (ref 135–145)

## 2022-12-23 LAB — IRON AND TIBC
Iron: 33 ug/dL (ref 28–170)
Saturation Ratios: 10 % — ABNORMAL LOW (ref 10.4–31.8)
TIBC: 325 ug/dL (ref 250–450)
UIBC: 292 ug/dL

## 2022-12-23 LAB — CBC
HCT: 29.4 % — ABNORMAL LOW (ref 36.0–46.0)
HCT: 32.3 % — ABNORMAL LOW (ref 36.0–46.0)
Hemoglobin: 10 g/dL — ABNORMAL LOW (ref 12.0–15.0)
Hemoglobin: 10.9 g/dL — ABNORMAL LOW (ref 12.0–15.0)
MCH: 30.6 pg (ref 26.0–34.0)
MCH: 30.7 pg (ref 26.0–34.0)
MCHC: 34 g/dL (ref 30.0–36.0)
MCHC: 33.7 g/dL (ref 30.0–36.0)
MCV: 89.9 fL (ref 80.0–100.0)
MCV: 91 fL (ref 80.0–100.0)
Platelets: 244 10*3/uL (ref 150–400)
Platelets: 312 10*3/uL (ref 150–400)
RBC: 3.27 MIL/uL — ABNORMAL LOW (ref 3.87–5.11)
RBC: 3.55 MIL/uL — ABNORMAL LOW (ref 3.87–5.11)
RDW: 12.2 % (ref 11.5–15.5)
RDW: 12.4 % (ref 11.5–15.5)
WBC: 9.6 10*3/uL (ref 4.0–10.5)
WBC: 8.7 10*3/uL (ref 4.0–10.5)
nRBC: 0 % (ref 0.0–0.2)
nRBC: 0 % (ref 0.0–0.2)

## 2022-12-23 LAB — FOLATE: Folate: 21.6 ng/mL (ref >5.9)

## 2022-12-23 LAB — GLUCOSE, CAPILLARY: Glucose-Capillary: 116 mg/dL — ABNORMAL HIGH (ref 70–99)

## 2022-12-23 LAB — HEMOGLOBIN AND HEMATOCRIT, BLOOD
HCT: 33.5 % — ABNORMAL LOW (ref 36.0–46.0)
Hemoglobin: 11.2 g/dL — ABNORMAL LOW (ref 12.0–15.0)

## 2022-12-23 LAB — VITAMIN B12: Vitamin B-12: 261 pg/mL (ref 180–914)

## 2022-12-23 LAB — APTT: aPTT: 39 seconds — ABNORMAL HIGH (ref 24–36)

## 2022-12-23 LAB — HIV ANTIBODY (ROUTINE TESTING W REFLEX): HIV Screen 4th Generation wRfx: NONREACTIVE

## 2022-12-23 LAB — FERRITIN: Ferritin: 13 ng/mL (ref 11–307)

## 2022-12-23 LAB — PROTIME-INR
INR: 1.2 (ref 0.8–1.2)
Prothrombin Time: 14.7 seconds (ref 11.4–15.2)

## 2022-12-23 MED ORDER — THYROID 60 MG PO TABS
60.0000 mg | ORAL_TABLET | Freq: Every day | ORAL | Status: DC
  Administered 2022-12-25: 60 mg via ORAL
  Filled 2022-12-23 (×3): qty 1

## 2022-12-23 MED ORDER — GABAPENTIN 300 MG PO CAPS
300.0000 mg | ORAL_CAPSULE | Freq: Every day | ORAL | Status: DC
  Administered 2022-12-23 – 2022-12-24 (×3): 300 mg via ORAL
  Filled 2022-12-23 (×3): qty 1

## 2022-12-23 MED ORDER — PROGESTERONE MICRONIZED 100 MG PO CAPS
100.0000 mg | ORAL_CAPSULE | Freq: Every evening | ORAL | Status: DC
  Administered 2022-12-23 – 2022-12-24 (×2): 100 mg via ORAL
  Filled 2022-12-23 (×2): qty 1

## 2022-12-23 MED ORDER — PEG 3350-KCL-NA BICARB-NACL 420 G PO SOLR
4000.0000 mL | Freq: Once | ORAL | Status: AC
  Administered 2022-12-23: 4000 mL via ORAL
  Filled 2022-12-23: qty 4000

## 2022-12-23 MED ORDER — ONDANSETRON HCL 4 MG/2ML IJ SOLN
4.0000 mg | Freq: Four times a day (QID) | INTRAMUSCULAR | Status: DC | PRN
  Administered 2022-12-23: 4 mg via INTRAVENOUS
  Filled 2022-12-23: qty 2

## 2022-12-23 MED ORDER — TRAZODONE HCL 50 MG PO TABS: 25.0000 mg | ORAL_TABLET | Freq: Every evening | ORAL | Status: DC | PRN

## 2022-12-23 MED ORDER — SIMVASTATIN 20 MG PO TABS
40.0000 mg | ORAL_TABLET | Freq: Every day | ORAL | Status: DC
  Administered 2022-12-23 – 2022-12-24 (×2): 40 mg via ORAL
  Filled 2022-12-23 (×2): qty 2

## 2022-12-23 MED ORDER — ONDANSETRON HCL 4 MG PO TABS: 4.0000 mg | ORAL_TABLET | Freq: Four times a day (QID) | ORAL | Status: DC | PRN

## 2022-12-23 MED ORDER — THYROID 30 MG PO TABS
15.0000 mg | ORAL_TABLET | Freq: Every day | ORAL | Status: DC
  Administered 2022-12-25: 15 mg via ORAL
  Filled 2022-12-23 (×3): qty 1

## 2022-12-23 MED ORDER — POTASSIUM CHLORIDE 20 MEQ PO PACK
40.0000 meq | PACK | Freq: Once | ORAL | Status: DC
  Filled 2022-12-23: qty 2

## 2022-12-23 MED ORDER — POLYETHYLENE GLYCOL 3350 17 G PO PACK: 17.0000 g | PACK | Freq: Every day | ORAL | Status: DC | PRN

## 2022-12-23 MED ORDER — PROCHLORPERAZINE EDISYLATE 10 MG/2ML IJ SOLN
10.0000 mg | Freq: Once | INTRAMUSCULAR | Status: AC
  Administered 2022-12-23: 10 mg via INTRAVENOUS
  Filled 2022-12-23: qty 2

## 2022-12-23 MED ORDER — SODIUM CHLORIDE 0.9 % IV SOLN
250.0000 mg | Freq: Once | INTRAVENOUS | Status: AC
  Administered 2022-12-23: 250 mg via INTRAVENOUS
  Filled 2022-12-23: qty 20

## 2022-12-23 MED ORDER — PANTOPRAZOLE SODIUM 40 MG IV SOLR
40.0000 mg | Freq: Two times a day (BID) | INTRAVENOUS | Status: DC
  Administered 2022-12-23: 40 mg via INTRAVENOUS
  Filled 2022-12-23 (×2): qty 10

## 2022-12-23 MED ORDER — ACETAMINOPHEN 325 MG PO TABS
650.0000 mg | ORAL_TABLET | Freq: Four times a day (QID) | ORAL | Status: DC | PRN
  Administered 2022-12-24 – 2022-12-25 (×2): 650 mg via ORAL
  Filled 2022-12-23 (×2): qty 2

## 2022-12-23 MED ORDER — ACETAMINOPHEN 650 MG RE SUPP: 650.0000 mg | Freq: Four times a day (QID) | RECTAL | Status: DC | PRN

## 2022-12-23 MED ORDER — SODIUM CHLORIDE 0.9 % IV SOLN: INTRAVENOUS | Status: DC

## 2022-12-23 MED ORDER — EVENING PRIMROSE OIL 500 MG PO CAPS: 2000.0000 mg | ORAL_CAPSULE | Freq: Every day | ORAL | Status: DC

## 2022-12-23 MED ORDER — PANTOPRAZOLE SODIUM 40 MG PO TBEC
40.0000 mg | DELAYED_RELEASE_TABLET | Freq: Every day | ORAL | Status: DC
  Administered 2022-12-23: 40 mg via ORAL
  Filled 2022-12-23: qty 1

## 2022-12-23 MED ORDER — DIPHENHYDRAMINE HCL 25 MG PO CAPS: 25.0000 mg | ORAL_CAPSULE | Freq: Every evening | ORAL | Status: DC | PRN

## 2022-12-23 MED ORDER — POTASSIUM CHLORIDE IN NACL 20-0.9 MEQ/L-% IV SOLN
INTRAVENOUS | Status: DC
  Filled 2022-12-23 (×5): qty 1000

## 2022-12-23 NOTE — TOC Initial Note (Signed)
Transition of Care [facility]) - Initial/Assessment Note    Patient Details  Name: Jamie Koch MRN: [id number] Date of Birth: [birth year]  Transition [facility]) CM/SW Contact:    Beverly Sessions, RN Phone Number: 12/23/2022, 1:43 PM  Clinical Narrative:                  Transition of Care [facility]) Screening Note   Patient Details  Name: Jamie Koch Date of Birth: 1956/03/09   Transition of Care [facility]) CM/SW Contact:    Beverly Sessions, RN Phone Number: 12/23/2022, 1:43 PM    Transition of Care Department [facility]) has reviewed patient and no TOC needs have been identified at this time. We will continue to monitor patient advancement through interdisciplinary progression rounds. If new patient transition needs arise, please place a TOC consult.          Patient Goals and CMS Choice            Expected Discharge Plan and Services                                              Prior Living Arrangements/Services                       Activities of Daily Living Home Assistive Devices/Equipment: None ADL Screening (condition at time of admission) Patient's cognitive ability adequate to safely complete daily activities?: Yes Is the patient deaf or have difficulty hearing?: No Does the patient have difficulty seeing, even when wearing glasses/contacts?: No Does the patient have difficulty concentrating, remembering, or making decisions?: No Patient able to express need for assistance with ADLs?: Yes Does the patient have difficulty dressing or bathing?: No Independently performs ADLs?: Yes (appropriate for developmental age) Does the patient have difficulty walking or climbing stairs?: No Weakness of Legs: None Weakness of Arms/Hands: None  Permission Sought/Granted                  Emotional Assessment              Admission diagnosis:  GI bleeding [K92.2] Rectal bleeding [K62.5] Patient Active Problem List   Diagnosis Date  Noted   GERD without esophagitis 12/23/2022   Dyslipidemia 12/23/2022   Peripheral neuropathy 12/23/2022   Anemia due to acute blood loss 12/23/2022   GI bleeding 12/22/2022   Rectal bleeding 12/21/2022   Acute neck pain 10/15/2021   Osteopenia 10/15/2021   Welcome to Medicare preventive visit 09/25/2020   Prediabetes 07/11/2019   Closed fracture of phalanx of foot 08/05/2017   Right foot injury, initial encounter 05/31/2017   Preventative health care 10/19/2016   Iron deficiency anemia 04/17/2016   Hypothyroidism 04/17/2016   Hyperlipidemia 04/17/2016   Osteoarthritis 04/17/2016   Fracture of head of radius 04/14/2016   PCP:  Pleas Koch, NP Pharmacy:   [facility] [id number] [place] [place] [address] Phone: [phone] Fax: [phone]     Social Determinants of Health (SDOH) Social History: SDOH Screenings   Food Insecurity: No Food Insecurity (12/23/2022)  Housing: Low Risk  (12/23/2022)  Transportation Needs: No Transportation Needs (12/23/2022)  Utilities: Not At Risk (12/23/2022)  Alcohol Screen: Low Risk  (02/03/2022)  Depression (PHQ2-9): Low Risk  (10/19/2022)  Financial  Resource Strain: Low Risk  (02/03/2022)  Physical Activity: Inactive (02/03/2022)  Social Connections: Moderately Integrated (02/03/2022)  Stress: No Stress Concern Present (02/03/2022)  Tobacco Use: Low Risk  (12/23/2022)   SDOH Interventions:     Readmission Risk Interventions     No data to display

## 2022-12-23 NOTE — Assessment & Plan Note (Signed)
-   We will continue statin therapy. 

## 2022-12-23 NOTE — Assessment & Plan Note (Signed)
-   The patient be admitted to a medical telemetry bed. - We will follow serial hemoglobins and hematocrits. - She will be kept NPO. - She will be hydrated with IV normal saline. - GI consult will be obtained. - I notified Dr. Allen Norris about the patient.

## 2022-12-23 NOTE — Assessment & Plan Note (Signed)
-   We will continue PPI therapy. 

## 2022-12-23 NOTE — Progress Notes (Signed)
PROGRESS NOTE    Jamie Koch  [id number] DOB: 04-02-1956 DOA: 12/22/2022 PCP: Pleas Koch, NP   Brief Narrative:  Jamie Koch is a 67 y.o. female with medical history significant for depression, hypothyroidism, dyslipidemia, and osteoarthritis, who presented to the emergency room with acute onset of melena.  The patient noted black tarry stools once.  She has been having diarrhea which has been bloody and intermittent since Saturday.  She denies any abdominal pain.  No nausea or vomiting or heartburn.  No cough or wheezing or dyspnea.  No dysuria, oliguria or hematuria or flank pain.  She admitted to palpitations without chest pain.  No fever or chills.   ED Course: When she came to the ER, temperature was 97.5 with normal other vital signs.  Labs revealed mild hypokalemia of 3.4 and blood glucose of 144 with total protein of 6.4 albumin of 4.  CBC showed WBC of 10.6 and mild anemia with hemoglobin 11.1 hematocrit 32.8 compared to 14.5 and 42.8 on 10/15/2021.  Blood group was A- with negative antibody screen.  Assessment & Plan:   Principal Problem:   GI bleeding Active Problems:   Anemia due to acute blood loss   Hypothyroidism   Dyslipidemia   GERD without esophagitis   Peripheral neuropathy  Acute blood loss anemia secondary to above all lower GI bleed: Patient came in with melena as well as noted blood per rectum.  She has not had any further episodes like that since admission.  GI was consulted, waiting for evaluation.  Patient's baseline hemoglobin is around 14, her current hemoglobin is around 11, no indication of transfusion, transfuse if less than 7.  Monitor every 12 hours.  Continue PPI.  Make it twice daily.  Acquired hypothyroidism: Continue Synthroid.  Dyslipidemia: Continue statin.  Peripheral neuropathy continue Neurontin.  GERD: Continue PPI.  DVT prophylaxis: Place and maintain sequential compression device Start: 12/23/22 0143   Code Status: Full  Code  Family Communication: Husband present at bedside.  Plan of care discussed with patient in length and he/she verbalized understanding and agreed with it.  Status is: Inpatient Remains inpatient appropriate because: Needs GI bleed workup.   Estimated body mass index is 35.12 kg/m as calculated from the following:   Height as of this encounter: 5' 4"$  (1.626 m).   Weight as of this encounter: 92.8 kg.    Nutritional Assessment: Body mass index is 35.12 kg/m.Marland Kitchen Seen by dietician.  I agree with the assessment and plan as outlined below: Nutrition Status:        . Skin Assessment: I have examined the patient's skin and I agree with the wound assessment as performed by the wound care RN as outlined below:    Consultants:  GI  Procedures:  As above  Antimicrobials:  Anti-infectives (From admission, onward)    None         Subjective: Patient seen and examined, husband at the bedside.  She states that she is feeling better, has not had any further GI bleed episodes since presentation to the ED.  Objective: Vitals:   12/23/22 0700 12/23/22 0800 12/23/22 0825 12/23/22 0945  BP: (!) 140/65 138/82  (!) 142/82  Pulse: 81 87  92  Resp: (!) 22 17  15  $ Temp:    98.7 F (37.1 C)  TempSrc:    Oral  SpO2: 95% 96%  97%  Weight:   94.8 kg 92.8 kg  Height:   5' 4"$  (1.626 m) 5' 4"$  (1.626 m)  No intake or output data in the 24 hours ending 12/23/22 1025 Filed Weights   12/22/22 2227 12/23/22 0825 12/23/22 0945  Weight: 94.8 kg 94.8 kg 92.8 kg    Examination:  General exam: Appears calm and comfortable  Respiratory system: Clear to auscultation. Respiratory effort normal. Cardiovascular system: S1 & S2 heard, RRR. No JVD, murmurs, rubs, gallops or clicks. No pedal edema. Gastrointestinal system: Abdomen is nondistended, soft and nontender. No organomegaly or masses felt. Normal bowel sounds heard. Central nervous system: Alert and oriented. No focal neurological  deficits. Extremities: Symmetric 5 x 5 power. Skin: No rashes, lesions or ulcers Psychiatry: Judgement and insight appear normal. Mood & affect appropriate.    Data Reviewed: I have personally reviewed following labs and imaging studies  CBC: Recent Labs  Lab 12/22/22 2229 12/23/22 0440  WBC 10.6* 9.6  HGB 11.1* 10.0*  HCT 32.8* 29.4*  MCV 89.9 89.9  PLT 302 [id number]   Basic Metabolic Panel: Recent Labs  Lab 12/22/22 2229 12/23/22 0440  NA 137 140  K 3.4* 3.6  CL 102 107  CO2 26 26  GLUCOSE 144* 116*  BUN 19 16  CREATININE 0.82 0.73  CALCIUM 9.0 8.5*  MG 2.1  --    GFR: Estimated Creatinine Clearance: 75.3 mL/min (by C-G formula based on SCr of 0.73 mg/dL). Liver Function Tests: Recent Labs  Lab 12/22/22 2229  AST 26  ALT 24  ALKPHOS 43  BILITOT 0.5  PROT 6.4*  ALBUMIN 4.0   No results for input(s): "LIPASE", "AMYLASE" in the last 168 hours. No results for input(s): "AMMONIA" in the last 168 hours. Coagulation Profile: No results for input(s): "INR", "PROTIME" in the last 168 hours. Cardiac Enzymes: No results for input(s): "CKTOTAL", "CKMB", "CKMBINDEX", "TROPONINI" in the last 168 hours. BNP (last 3 results) No results for input(s): "PROBNP" in the last 8760 hours. HbA1C: No results for input(s): "HGBA1C" in the last 72 hours. CBG: No results for input(s): "GLUCAP" in the last 168 hours. Lipid Profile: No results for input(s): "CHOL", "HDL", "LDLCALC", "TRIG", "CHOLHDL", "LDLDIRECT" in the last 72 hours. Thyroid Function Tests: No results for input(s): "TSH", "T4TOTAL", "FREET4", "T3FREE", "THYROIDAB" in the last 72 hours. Anemia Panel: Recent Labs    12/23/22 0440  FOLATE 21.6  FERRITIN 13  TIBC 325  IRON 33   Sepsis Labs: No results for input(s): "PROCALCITON", "LATICACIDVEN" in the last 168 hours.  No results found for this or any previous visit (from the past 240 hour(s)).   Radiology Studies: No results found.  Scheduled Meds:   gabapentin  300 mg Oral QHS   pantoprazole  40 mg Oral Daily   potassium chloride  40 mEq Oral Once   progesterone  100 mg Oral QPM   simvastatin  40 mg Oral q1800   thyroid  15 mg Oral Q0600   thyroid  60 mg Oral Q0600   Continuous Infusions:  0.9 % NaCl with KCl 20 mEq / L 100 mL/hr at 12/23/22 0946     LOS: 1 day   Darliss Cheney, MD [facility]  12/23/2022, 10:25 AM   *Please note that this is a verbal dictation therefore any spelling or grammatical errors are due to the "[place] [facility]" system interpretation.  Please page via [place] and do not message via secure chat for urgent patient care matters. Secure chat can be used for non urgent patient care matters.  How to contact the [facility] Attending or Consulting provider [place] or covering  provider during after hours [place], for this patient?  Check the care team in [facility] and look for a) attending/consulting [facility] provider listed and b) the [facility] team listed. Page or secure chat [phone]. Log into [email or web address] and use [place] universal password to access. If you do not have the password, please contact the hospital operator. Locate the [facility] provider you are looking for under [facility] and page to a number that you can be directly reached. If you still have difficulty reaching the provider, please page the [facility] (Director on Call) for the Hospitalists listed on amion for assistance.

## 2022-12-23 NOTE — Consult Note (Signed)
Jamie Darby, MD [address]  [place]  [place], [place] [address]  Main: [phone]  Fax: [phone] Pager: [phone]   Consultation  Referring Provider:     No ref. provider found Primary Care Physician:  Pleas Koch, NP Primary Gastroenterologist: Althia Forts      Reason for Consultation: Rectal bleeding  Date of Admission:  12/22/2022 Date of Consultation:  12/23/2022         HPI:   Jamie Koch is a 67 y.o. female with history of hypothyroidism, depression, dyslipidemia and osteoarthritis presented with 4 days history of diarrhea mixed with blood that started since Saturday, initially it was bright red blood in the stool followed by few more episodes of dark red blood, last bowel movement yesterday was black. Denies any abdominal pain.  Patient was hemodynamically stable in the ER.  Labs revealed mild anemia, hemoglobin dropped from 14.5 in December 22 to 11.1 yesterday and 10.0 today, normal MCV, normal BUN/creatinine. Patient's husband is bedside who is concerned about chronic intake of ibuprofen for arthritis Patient denies any abdominal pain, nausea or vomiting Denies any alcohol use or smoking  Cologuard in 2022 was negative  NSAIDs: Ibuprofen several times a week for arthritis  Antiplts/Anticoagulants/Anti thrombotics: None  GI Procedures: None  Past Medical History:  Diagnosis Date   Arthritis    Chicken pox    Depression    Eye irritation 07/17/2020   Hay fever    High cholesterol    Hypothyroidism    Osteoporosis    PMB (postmenopausal bleeding) 07/17/2020   Thyroid dysfunction     Past Surgical History:  Procedure Laterality Date   DILATATION & CURETTAGE/HYSTEROSCOPY WITH MYOSURE N/A 07/29/2020   Procedure: [place] RESECTION OF ENDOMETRIAL POLYP;  Surgeon: Boykin Nearing, MD;  Location: [facility] ORS;  Service: Gynecology;  Laterality: N/A;   LAPAROSCOPIC TOTAL HYSTERECTOMY   07/02/2022   PARTIAL KNEE ARTHROPLASTY Left    TONSILLECTOMY AND ADENOIDECTOMY  1964   TOTAL KNEE ARTHROPLASTY Right      Current Facility-Administered Medications:    0.9 %  sodium chloride infusion, , Intravenous, Continuous, Florence Yeung, Tally Due, MD   0.9 % NaCl with KCl 20 mEq/ L  infusion, , Intravenous, Continuous, Mansy, Jan A, MD, Last Rate: 100 mL/hr at 12/23/22 1129, New Bag at 12/23/22 1129   acetaminophen (TYLENOL) tablet 650 mg, 650 mg, Oral, Q6H PRN **OR** acetaminophen (TYLENOL) suppository 650 mg, 650 mg, Rectal, Q6H PRN, Mansy, Jan A, MD   diphenhydrAMINE (BENADRYL) capsule 25 mg, 25 mg, Oral, QHS PRN, Mansy, Jan A, MD   gabapentin (NEURONTIN) capsule 300 mg, 300 mg, Oral, QHS, Mansy, Jan A, MD, 300 mg at 12/23/22 0139   ondansetron (ZOFRAN) tablet 4 mg, 4 mg, Oral, Q6H PRN **OR** ondansetron (ZOFRAN) injection 4 mg, 4 mg, Intravenous, Q6H PRN, Mansy, Jan A, MD   pantoprazole (PROTONIX) injection 40 mg, 40 mg, Intravenous, Q12H, Pahwani, Ravi, MD   polyethylene glycol (MIRALAX / GLYCOLAX) packet 17 g, 17 g, Oral, Daily PRN, Mansy, Jan A, MD   polyethylene glycol-electrolytes (NuLYTELY) solution 4,000 mL, 4,000 mL, Oral, Once, Stephaie Dardis, Tally Due, MD   potassium chloride (KLOR-CON) packet 40 mEq, 40 mEq, Oral, Once, Mansy, Jan A, MD   progesterone (PROMETRIUM) capsule 100 mg, 100 mg, Oral, QPM, Mansy, Jan A, MD   simvastatin (ZOCOR) tablet 40 mg, 40 mg, Oral, q1800, Mansy, Jan A, MD   thyroid (ARMOUR) tablet 15 mg, 15 mg,  Oral, [id number], Mansy, Jan A, MD   thyroid (ARMOUR) tablet 60 mg, 60 mg, Oral, Q0600, Mansy, Jan A, MD   traZODone (DESYREL) tablet 25 mg, 25 mg, Oral, QHS PRN, Mansy, Arvella Merles, MD   History reviewed. No pertinent family history.   Social History   Tobacco Use   Smoking status: Never   Smokeless tobacco: Never  Vaping Use   Vaping Use: Never used  Substance Use Topics   Alcohol use: No   Drug use: No    Allergies as of 12/22/2022 - Review Complete  12/22/2022  Allergen Reaction Noted   Covid-19 (mrna) vaccine Other (See Comments) 07/16/2020   Lactose intolerance (gi)  07/16/2020   Medroxyprogesterone Itching 07/16/2020    Review of Systems:    All systems reviewed and negative except where noted in HPI.   Physical Exam:  Vital signs in last 24 hours: Temp:  [98.5 F (36.9 C)-98.9 F (37.2 C)] 98.7 F (37.1 C) (02/14 0945) Pulse Rate:  [81-97] 92 (02/14 0945) Resp:  [15-22] 15 (02/14 0945) BP: (97-166)/(61-94) 142/82 (02/14 0945) SpO2:  [94 %-98 %] 97 % (02/14 0945) Weight:  [92.8 kg-94.8 kg] 92.8 kg (02/14 0945)   General:   Pleasant, cooperative in NAD Head:  Normocephalic and atraumatic. Eyes:   No icterus.   Conjunctiva pink. PERRLA. Ears:  Normal auditory acuity. Neck:  Supple; no masses or thyroidomegaly Lungs: Respirations even and unlabored. Lungs clear to auscultation bilaterally.   No wheezes, crackles, or rhonchi.  Heart:  Regular rate and rhythm;  Without murmur, clicks, rubs or gallops Abdomen:  Soft, nondistended, nontender. Normal bowel sounds. No appreciable masses or hepatomegaly.  No rebound or guarding.  Rectal:  Not performed. Msk:  Symmetrical without gross deformities.  Strength normal Extremities:  Without edema, cyanosis or clubbing. Neurologic:  Alert and oriented x3;  grossly normal neurologically. Skin:  Intact without significant lesions or rashes. Psych:  Alert and cooperative. Normal affect.  LAB RESULTS:    Latest Ref Rng & Units 12/23/2022    4:40 AM 12/22/2022   10:29 PM 10/15/2021   12:03 PM  CBC  WBC 4.0 - 10.5 K/uL 9.6  10.6  7.9   Hemoglobin 12.0 - 15.0 g/dL 10.0  11.1  14.5   Hematocrit 36.0 - 46.0 % 29.4  32.8  42.8   Platelets 150 - 400 K/uL 244  302  294.0     BMET    Latest Ref Rng & Units 12/23/2022    4:40 AM 12/22/2022   10:29 PM 10/19/2022   10:12 AM  BMP  Glucose 70 - 99 mg/dL 116  144  124   BUN 8 - 23 mg/dL 16  19  8   $ Creatinine 0.44 - 1.00 mg/dL 0.73  0.82   0.66   Sodium 135 - 145 mmol/L 140  137  140   Potassium 3.5 - 5.1 mmol/L 3.6  3.4  3.7   Chloride 98 - 111 mmol/L 107  102  100   CO2 22 - 32 mmol/L 26  26  31   $ Calcium 8.9 - 10.3 mg/dL 8.5  9.0  9.2     LFT    Latest Ref Rng & Units 12/22/2022   10:29 PM 10/19/2022   10:12 AM 10/15/2021   12:03 PM  Hepatic Function  Total Protein 6.5 - 8.1 g/dL 6.4  6.3  6.6   Albumin 3.5 - 5.0 g/dL 4.0  4.3  4.3   AST 15 - 41 U/L  $26  13  22   E$ ALT 0 - 44 U/L 24  15  24   $ Alk Phosphatase 38 - 126 U/L 43  54  54   Total Bilirubin 0.3 - 1.2 mg/dL 0.5  0.7  0.5      STUDIES: No results found.    Impression / Plan:   Emiley Tedesco is a 67 y.o. female with history of hypothyroidism, depression, osteoarthritis presented with 4 days history of hematochezia, initially fresh blood followed by dark red and most recently melena  Hematochezia with normocytic anemia, without any abdominal pain Normal BUN/creatinine, less likely upper GI bleed Monitor CBC closely to maintain hemoglobin above 7 Check iron panel Start clear liquid diet today Discussed with patient regarding upper endoscopy given history of chronic NSAID use as well as colonoscopy for further evaluation N.p.o. effective a.m. tomorrow Bowel prep ordered  I have discussed alternative options, risks & benefits,  which include, but are not limited to, bleeding, infection, perforation,respiratory complication & drug reaction.  The patient agrees with this plan & written consent will be obtained.     Thank you for involving me in the care of this patient.      LOS: 1 day   Sherri Sear, MD  12/23/2022, 3:46 PM    Note: This dictation was prepared with Dragon dictation along with smaller phrase technology. Any transcriptional errors that result from this process are unintentional.

## 2022-12-23 NOTE — Progress Notes (Signed)
PHARMACIST - PHYSICIAN ORDER COMMUNICATION  CONCERNING: P&T Medication Policy on Herbal Medications  DESCRIPTION:  This patient's order for:  Evening Primrose  has been noted.  This product(s) is classified as an "herbal" or natural product. Due to a lack of definitive safety studies or FDA approval, nonstandard manufacturing practices, plus the potential risk of unknown drug-drug interactions while on inpatient medications, the Pharmacy and Therapeutics Committee does not permit the use of "herbal" or natural products of this type within [facility].   ACTION TAKEN: The pharmacy department is unable to verify this order at this time and your patient has been informed of this safety policy. Please reevaluate patient's clinical condition at discharge and address if the herbal or natural product(s) should be resumed at that time.

## 2022-12-23 NOTE — ED Notes (Signed)
Report given to Alexys, RN

## 2022-12-23 NOTE — Assessment & Plan Note (Signed)
-   The patient was typed and crossmatched.  This is likely secondary to GI bleeding. - At this time he does not need to be transfused. - We will follow serial hemoglobins and hematocrits.

## 2022-12-23 NOTE — Assessment & Plan Note (Signed)
-   We will continue thyroid replacement therapy.

## 2022-12-23 NOTE — ED Notes (Signed)
Assumed care from [place]. Pt resting comfortably in bed at this time. Pt denies any current needs or questions. Call light with in reach.

## 2022-12-23 NOTE — Assessment & Plan Note (Signed)
-   We will continue Neurontin. ?

## 2022-12-24 ENCOUNTER — Ambulatory Visit: Payer: Medicare Other | Admitting: Internal Medicine

## 2022-12-24 ENCOUNTER — Encounter: Payer: Self-pay | Admitting: Family Medicine

## 2022-12-24 ENCOUNTER — Inpatient Hospital Stay: Payer: Medicare Other | Admitting: Registered Nurse

## 2022-12-24 ENCOUNTER — Encounter
Admission: EM | Disposition: A | Discharge status: Home / Self Care | Payer: Self-pay | Source: Home / Self Care | Attending: Family Medicine

## 2022-12-24 DIAGNOSIS — K259 Gastric ulcer, unspecified as acute or chronic, without hemorrhage or perforation: Secondary | ICD-10-CM

## 2022-12-24 DIAGNOSIS — K625 Hemorrhage of anus and rectum: Secondary | ICD-10-CM

## 2022-12-24 DIAGNOSIS — K3189 Other diseases of stomach and duodenum: Secondary | ICD-10-CM

## 2022-12-24 DIAGNOSIS — K921 Melena: Secondary | ICD-10-CM | POA: Diagnosis not present

## 2022-12-24 DIAGNOSIS — K449 Diaphragmatic hernia without obstruction or gangrene: Secondary | ICD-10-CM

## 2022-12-24 HISTORY — PX: ESOPHAGOGASTRODUODENOSCOPY (EGD) WITH PROPOFOL: SHX5813

## 2022-12-24 HISTORY — DX: Gastric ulcer, unspecified as acute or chronic, without hemorrhage or perforation: K25.9

## 2022-12-24 HISTORY — PX: COLONOSCOPY WITH PROPOFOL: SHX5780

## 2022-12-24 LAB — CBC
HCT: 30.7 % — ABNORMAL LOW (ref 36.0–46.0)
HCT: 29.6 % — ABNORMAL LOW (ref 36.0–46.0)
Hemoglobin: 10.4 g/dL — ABNORMAL LOW (ref 12.0–15.0)
Hemoglobin: 10.1 g/dL — ABNORMAL LOW (ref 12.0–15.0)
MCH: 30.7 pg (ref 26.0–34.0)
MCH: 31 pg (ref 26.0–34.0)
MCHC: 33.9 g/dL (ref 30.0–36.0)
MCHC: 34.1 g/dL (ref 30.0–36.0)
MCV: 90.6 fL (ref 80.0–100.0)
MCV: 90.8 fL (ref 80.0–100.0)
Platelets: 318 10*3/uL (ref 150–400)
Platelets: 319 10*3/uL (ref 150–400)
RBC: 3.39 MIL/uL — ABNORMAL LOW (ref 3.87–5.11)
RBC: 3.26 MIL/uL — ABNORMAL LOW (ref 3.87–5.11)
RDW: 12.5 % (ref 11.5–15.5)
RDW: 12.7 % (ref 11.5–15.5)
WBC: 9.9 10*3/uL (ref 4.0–10.5)
WBC: 10.3 10*3/uL (ref 4.0–10.5)
nRBC: 0 % (ref 0.0–0.2)
nRBC: 0 % (ref 0.0–0.2)

## 2022-12-24 LAB — BASIC METABOLIC PANEL
Anion gap: 9 (ref 5–15)
BUN: 6 mg/dL — ABNORMAL LOW (ref 8–23)
CO2: 22 mmol/L (ref 22–32)
Calcium: 7.8 mg/dL — ABNORMAL LOW (ref 8.9–10.3)
Chloride: 107 mmol/L (ref 98–111)
Creatinine, Ser: 0.6 mg/dL (ref 0.44–1.00)
GFR, Estimated: >60 mL/min (ref >60)
Glucose, Bld: 136 mg/dL — ABNORMAL HIGH (ref 70–99)
Potassium: 3.4 mmol/L — ABNORMAL LOW (ref 3.5–5.1)
Sodium: 138 mmol/L (ref 135–145)

## 2022-12-24 SURGERY — ESOPHAGOGASTRODUODENOSCOPY (EGD) WITH PROPOFOL
Anesthesia: General

## 2022-12-24 MED ORDER — PROPOFOL 10 MG/ML IV BOLUS
INTRAVENOUS | Status: DC | PRN
  Administered 2022-12-24: 70 mg via INTRAVENOUS

## 2022-12-24 MED ORDER — GLYCOPYRROLATE 0.2 MG/ML IJ SOLN
INTRAMUSCULAR | Status: DC | PRN
  Administered 2022-12-24: .2 mg via INTRAVENOUS

## 2022-12-24 MED ORDER — DEXMEDETOMIDINE HCL IN NACL 200 MCG/50ML IV SOLN
INTRAVENOUS | Status: DC | PRN
  Administered 2022-12-24: 8 ug via INTRAVENOUS

## 2022-12-24 MED ORDER — LIDOCAINE HCL (CARDIAC) PF 100 MG/5ML IV SOSY
PREFILLED_SYRINGE | INTRAVENOUS | Status: DC | PRN
  Administered 2022-12-24: 40 mg via INTRAVENOUS

## 2022-12-24 MED ORDER — POTASSIUM CHLORIDE 10 MEQ/100ML IV SOLN
10.0000 meq | INTRAVENOUS | Status: AC
  Administered 2022-12-24: 10 meq via INTRAVENOUS
  Filled 2022-12-24: qty 100

## 2022-12-24 MED ORDER — PHENYLEPHRINE HCL (PRESSORS) 10 MG/ML IV SOLN
INTRAVENOUS | Status: DC | PRN
  Administered 2022-12-24: 160 ug via INTRAVENOUS

## 2022-12-24 MED ORDER — PROPOFOL 500 MG/50ML IV EMUL
INTRAVENOUS | Status: DC | PRN
  Administered 2022-12-24: 150 ug/kg/min via INTRAVENOUS

## 2022-12-24 MED ORDER — PANTOPRAZOLE SODIUM 40 MG PO TBEC
40.0000 mg | DELAYED_RELEASE_TABLET | Freq: Two times a day (BID) | ORAL | Status: DC
  Administered 2022-12-24 – 2022-12-25 (×2): 40 mg via ORAL
  Filled 2022-12-24 (×2): qty 1

## 2022-12-24 NOTE — Anesthesia Preprocedure Evaluation (Signed)
Anesthesia Evaluation  Patient identified by MRN, date of birth, ID band Patient awake    Reviewed: Allergy & Precautions, H&P , NPO status , Patient's Chart, lab work & pertinent test results, reviewed documented beta blocker date and time   History of Anesthesia Complications Negative for: history of anesthetic complications  Airway Mallampati: II  TM Distance: >3 FB Neck ROM: full    Dental  (+) Teeth Intact, Dental Advidsory Given, Caps Permanent bridge on bottom left:   Pulmonary neg pulmonary ROS   Pulmonary exam normal        Cardiovascular Exercise Tolerance: Poor negative cardio ROS Normal cardiovascular exam Rhythm:regular Rate:Normal     Neuro/Psych  PSYCHIATRIC DISORDERS  Depression    negative neurological ROS     GI/Hepatic Neg liver ROS,GERD  ,,  Endo/Other  neg diabetesHypothyroidism    Renal/GU negative Renal ROS  negative genitourinary   Musculoskeletal   Abdominal   Peds  Hematology negative hematology ROS (+) Blood dyscrasia, anemia   Anesthesia Other Findings Past Medical History: No date: Arthritis No date: Chicken pox No date: Depression No date: Hay fever No date: High cholesterol No date: Hypothyroidism No date: Osteoporosis No date: Thyroid dysfunction Past Surgical History: No date: PARTIAL KNEE ARTHROPLASTY; Left 1964: TONSILLECTOMY AND ADENOIDECTOMY No date: TOTAL KNEE ARTHROPLASTY; Right   Reproductive/Obstetrics negative OB ROS                             Anesthesia Physical Anesthesia Plan  ASA: 2  Anesthesia Plan: General   Post-op Pain Management:    Induction: Intravenous  PONV Risk Score and Plan: 3 and Propofol infusion and TIVA  Airway Management Planned: Natural Airway and Nasal Cannula  Additional Equipment:   Intra-op Plan:   Post-operative Plan:   Informed Consent: I have reviewed the patients History and Physical,  chart, labs and discussed the procedure including the risks, benefits and alternatives for the proposed anesthesia with the patient or authorized representative who has indicated his/her understanding and acceptance.     Dental Advisory Given  Plan Discussed with: CRNA  Anesthesia Plan Comments:         Anesthesia Quick Evaluation

## 2022-12-24 NOTE — Op Note (Addendum)
[facility] Patient Name: Jamie Koch Procedure Date: 12/24/2022 8:33 AM MRN: [id number] Account #: [id number] Date of Birth: 09-02-1956 Admit Type: Inpatient Age: 67 Room: [facility] ENDO ROOM 3 Gender: Female Note Status: Finalized Instrument Name: Jasper Riling [id number] Procedure:             Colonoscopy Indications:           Hematochezia Providers:             Jonathon Bellows MD, MD Referring MD:          No Local Md, MD (Referring MD) Medicines:             Monitored Anesthesia Care Complications:         No immediate complications. Procedure:             Pre-Anesthesia Assessment:                        - Prior to the procedure, a History and Physical was                         performed, and patient medications, allergies and                         sensitivities were reviewed. The patient's tolerance                         of previous anesthesia was reviewed.                        - The risks and benefits of the procedure and the                         sedation options and risks were discussed with the                         patient. All questions were answered and informed                         consent was obtained.                        - ASA Grade Assessment: II - A patient with mild                         systemic disease.                        After obtaining informed consent, the colonoscope was                         passed under direct vision. Throughout the procedure,                         the patient's blood pressure, pulse, and oxygen                         saturations were monitored continuously. The                         Colonoscope was introduced through the anus  and                         advanced to the the cecum, identified by the                         appendiceal orifice. The colonoscopy was performed                         with ease. The patient tolerated the procedure well.                         The  quality of the bowel preparation was fair. The                         ileocecal valve, appendiceal orifice, and rectum were                         photographed. Findings:      The perianal and digital rectal examinations were normal.      Multiple large-mouthed diverticula were found in the left colon.      Red blood was found in the rectum, in the sigmoid colon and in the       descending colon. Lavage of the area was performed using a large amount       of sterile water, resulting in clearance with good visualization. No       active bleeding seen.      The exam was otherwise without abnormality on direct and retroflexion       views. Impression:            - Preparation of the colon was fair.                        - Diverticulosis in the left colon.                        - Blood in the rectum, in the sigmoid colon and in the                         descending colon.                        - The examination was otherwise normal on direct and                         retroflexion views.                        - No specimens collected. Recommendation:        - Return patient to hospital ward for ongoing care.                        - Clear liquid diet today.                        - can change to oral PPI prilsoec 40 mg a day for 3                         months  Likely had a significant diverticular bleed and                         probably a smaller bleed from the tiny ulcers in the                         stomach                        If has further bleeding get CT-A or tagged RBC scan                        Less likely small bowel bleed as right colon had very                         minimal hematin stains Procedure Code(s):     --- Professional ---                        [phone], Colonoscopy, flexible; diagnostic, including                         collection of specimen(s) by brushing or washing, when                         performed (separate  procedure) Diagnosis Code(s):     --- Professional ---                        K62.5, Hemorrhage of anus and rectum                        K92.2, Gastrointestinal hemorrhage, unspecified                        K92.1, Melena (includes Hematochezia)                        K57.30, Diverticulosis of large intestine without                         perforation or abscess without bleeding CPT copyright 2022 American Medical Association. All rights reserved. The codes documented in this report are preliminary and upon coder review may  be revised to meet current compliance requirements. Jonathon Bellows, MD Jonathon Bellows MD, MD 12/24/2022 9:36:31 AM This report has been signed electronically. Number of Addenda: 0 Note Initiated On: 12/24/2022 8:33 AM Scope Withdrawal Time: 0 hours 17 minutes 7 seconds  Total Procedure Duration: 0 hours 26 minutes 43 seconds  Estimated Blood Loss:  Estimated blood loss: none.      [facility]

## 2022-12-24 NOTE — H&P (Signed)
Jonathon Bellows, MD [address], [place], [place], [place], [address] [address], [place], [place], [place], [address] Phone: [phone]  Fax: [phone]  Primary Care Physician:  Pleas Koch, NP   Pre-Procedure History & Physical: HPI:  Jamie Koch is a 67 y.o. female is here for an endoscopy and colonoscopy    Past Medical History:  Diagnosis Date   Arthritis    Chicken pox    Depression    Eye irritation 07/17/2020   Hay fever    High cholesterol    Hypothyroidism    Osteoporosis    PMB (postmenopausal bleeding) 07/17/2020   Thyroid dysfunction     Past Surgical History:  Procedure Laterality Date   DILATATION & CURETTAGE/HYSTEROSCOPY WITH MYOSURE N/A 07/29/2020   Procedure: [place] RESECTION OF ENDOMETRIAL POLYP;  Surgeon: Boykin Nearing, MD;  Location: [facility] ORS;  Service: Gynecology;  Laterality: N/A;   LAPAROSCOPIC TOTAL HYSTERECTOMY  07/02/2022   PARTIAL KNEE ARTHROPLASTY Left    TONSILLECTOMY AND ADENOIDECTOMY  1964   TOTAL KNEE ARTHROPLASTY Right     Prior to Admission medications   Medication Sig Start Date End Date Taking? Authorizing Provider  Acetaminophen (TYLENOL ARTHRITIS PAIN PO) Take 2 tablets by mouth.   Yes [provider]  ARMOUR THYROID 15 MG tablet Take 1 tablet by mouth daily. 11/03/22  Yes [provider]  diphenhydrAMINE (BENADRYL) 25 MG tablet Take 25 mg by mouth at bedtime as needed for allergies or sleep.   Yes [provider]  gabapentin (NEURONTIN) 300 MG capsule Take 1 capsule (300 mg total) by mouth at bedtime. For pain. 10/19/22  Yes Pleas Koch, NP  omeprazole (PRILOSEC) 20 MG capsule Take 1 capsule (20 mg total) by mouth daily. 12/21/22  Yes Venia Carbon, MD  OVER THE COUNTER MEDICATION Place 2 sprays into both nostrils daily as needed (allergies). Sovereign Silver nasal spray   Yes [provider]  PRESCRIPTION  MEDICATION Testosterone 100 mg pellets placed under the skin every 3 months   Yes [provider]  PRESCRIPTION MEDICATION Estradiol 6 mg pellets placed under the skin every 3 months   Yes [provider]  progesterone (PROMETRIUM) 100 MG capsule Take 100 mg by mouth every evening.   Yes [provider]  simvastatin (ZOCOR) 40 MG tablet TAKE 1/2 TABLET(20 MG) BY MOUTH DAILY for cholesterol. Office visit required for further refills. 09/20/22  Yes Pleas Koch, NP  thyroid (ARMOUR) 60 MG tablet Take 60 mg by mouth daily before breakfast.   Yes [provider]  triamcinolone cream (KENALOG) 0.1 % Apply 1 Application topically daily. 12/07/22  Yes [provider]  Vitamin D-Vitamin K (VITAMIN K2-VITAMIN D3 PO) Take 3 tablets by mouth daily.   Yes [provider]    Allergies as of 12/22/2022 - Review Complete 12/22/2022  Allergen Reaction Noted   Covid-19 (mrna) vaccine Other (See Comments) 07/16/2020   Lactose intolerance (gi)  07/16/2020   Medroxyprogesterone Itching 07/16/2020    History reviewed. No pertinent family history.  Social History   Socioeconomic History   Marital status: Married    Spouse name: Not on file   Number of children: Not on file   Years of education: Not on file   Highest education level: Not on file  Occupational History   Not on file  Tobacco Use   Smoking status: Never   Smokeless tobacco: Never  Vaping Use  Vaping Use: Never used  Substance and Sexual Activity   Alcohol use: No   Drug use: No   Sexual activity: Yes  Other Topics Concern   Not on file  Social History Narrative   Married.   1 child.   Retired. Worked in Insurance claims handler.    Enjoys reading, gardening.    Social Determinants of Health   Financial Resource Strain: Low Risk  (02/03/2022)   Overall Financial Resource Strain (CARDIA)    Difficulty of Paying Living Expenses: Not hard at all  Food Insecurity: No Food  Insecurity (12/23/2022)   Hunger Vital Sign    Worried About Running Out of Food in the Last Year: Never true    Ran Out of Food in the Last Year: Never true  Transportation Needs: No Transportation Needs (12/23/2022)   PRAPARE - Hydrologist (Medical): No    Lack of Transportation (Non-Medical): No  Physical Activity: Inactive (02/03/2022)   Exercise Vital Sign    Days of Exercise per Week: 0 days    Minutes of Exercise per Session: 0 min  Stress: No Stress Concern Present (02/03/2022)   [place]    Feeling of Stress : Not at all  Social Connections: Moderately Integrated (02/03/2022)   Social Connection and Isolation Panel [NHANES]    Frequency of Communication with Friends and Family: More than three times a week    Frequency of Social Gatherings with Friends and Family: Never    Attends Religious Services: More than 4 times per year    Active Member of [employer] or Organizations: No    Attends Archivist Meetings: Never    Marital Status: Married  Human resources officer Violence: Not At Risk (12/23/2022)   Humiliation, Afraid, Rape, and Kick questionnaire    Fear of Current or Ex-Partner: No    Emotionally Abused: No    Physically Abused: No    Sexually Abused: No    Review of Systems: See HPI, otherwise negative ROS  Physical Exam: BP 116/63 (BP Location: Right Arm)   Pulse (!) 102   Temp 98.2 F (36.8 C)   Resp 16   Ht 5' 4"$  (1.626 m)   Wt 92.8 kg   SpO2 95%   BMI 35.12 kg/m  General:   Alert,  pleasant and cooperative in NAD Head:  Normocephalic and atraumatic. Neck:  Supple; no masses or thyromegaly. Lungs:  Clear throughout to auscultation, normal respiratory effort.    Heart:  +S1, +S2, Regular rate and rhythm, No edema. Abdomen:  Soft, nontender and nondistended. Normal bowel sounds, without guarding, and without rebound.   Neurologic:  Alert and  oriented x4;   grossly normal neurologically.  Impression/Plan: Jamie Koch is here for an endoscopy and colonoscopy  to be performed for  evaluation of hematochezia    Risks, benefits, limitations, and alternatives regarding endoscopy have been reviewed with the patient.  Questions have been answered.  All parties agreeable.   Jonathon Bellows, MD  12/24/2022, 8:40 AM

## 2022-12-24 NOTE — Progress Notes (Signed)
       CROSS COVER NOTE  NAME: Jamie Koch MRN: [id number] DOB : May 23, 1956    HPI/Events of Note   Nurse reports multiple episodes of BRBPR with bowel movements with ongoing coloscopy prep accompanied by chills and shivers  Assessment and  Interventions   Assessment: H & H spot check 11.2/33.5 stable.  Plan: Continue to monitor Up to bedside commode with assistance only       Kathlene Cote NP [facility]

## 2022-12-24 NOTE — Transfer of Care (Signed)
Immediate Anesthesia Transfer of Care Note  Patient: Jamie Koch  Procedure(s) Performed: Procedure(s): ESOPHAGOGASTRODUODENOSCOPY (EGD) WITH PROPOFOL (N/A) COLONOSCOPY WITH PROPOFOL (N/A)  Patient Location: PACU and Endoscopy Unit  Anesthesia Type:General  Level of Consciousness: sedated  Airway & Oxygen Therapy: Patient Spontanous Breathing and Patient connected to nasal cannula oxygen  Post-op Assessment: Report given to RN and Post -op Vital signs reviewed and stable  Post vital signs: Reviewed and stable  Last Vitals:  Vitals:   12/24/22 0747 12/24/22 0938  BP: 116/63 (!) 112/58  Pulse: (!) 102 98  Resp: 16 (!) 21  Temp: 36.8 C 36.4 C  SpO2: [id number]    Complications: No apparent anesthesia complications

## 2022-12-24 NOTE — Op Note (Signed)
[facility] Gastroenterology Patient Name: Jamie Koch Procedure Date: 12/24/2022 8:35 AM MRN: [id number] Account #: [id number] Date of Birth: June 04, 1956 Admit Type: Inpatient Age: 67 Room: [facility] ENDO ROOM 3 Gender: Female Note Status: Finalized Instrument Name: Upper Endoscope [id number] Procedure:             Upper GI endoscopy Indications:           Hematochezia Providers:             Jonathon Bellows MD, MD Referring MD:          No Local Md, MD (Referring MD) Medicines:             Monitored Anesthesia Care Complications:         No immediate complications. Procedure:             Pre-Anesthesia Assessment:                        - Prior to the procedure, a History and Physical was                         performed, and patient medications, allergies and                         sensitivities were reviewed. The patient's tolerance                         of previous anesthesia was reviewed.                        - The risks and benefits of the procedure and the                         sedation options and risks were discussed with the                         patient. All questions were answered and informed                         consent was obtained.                        - ASA Grade Assessment: II - A patient with mild                         systemic disease.                        After obtaining informed consent, the endoscope was                         passed under direct vision. Throughout the procedure,                         the patient's blood pressure, pulse, and oxygen                         saturations were monitored continuously. The Endoscope                         was introduced  through the mouth, and advanced to the                         third part of duodenum. The upper GI endoscopy was                         accomplished with ease. The patient tolerated the                         procedure well. Findings:      The examined duodenum was  normal.      The esophagus was normal.      Five non-bleeding superficial gastric ulcers with a clean ulcer base       (Forrest Class III) were found on the greater curvature of the gastric       antrum. The largest lesion was 5 mm in largest dimension.      The cardia and gastric fundus were normal on retroflexion. Impression:            - Normal examined duodenum.                        - Normal esophagus.                        - Non-bleeding gastric ulcers with a clean ulcer base                         (Forrest Class III).                        - No specimens collected. Recommendation:        - No ibuprofen, naproxen, or other non-steroidal                         anti-inflammatory drugs.                        - Prilosec 40 BID for 3 months                        RepeatEGD in 8 weeks to check for healing of ulcers                        Check H pylori breath testoff PPI for a week Procedure Code(s):     --- Professional ---                        [phone], Esophagogastroduodenoscopy, flexible,                         transoral; diagnostic, including collection of                         specimen(s) by brushing or washing, when performed                         (separate procedure) Diagnosis Code(s):     --- Professional ---                        K25.9, Gastric  ulcer, unspecified as acute or chronic,                         without hemorrhage or perforation                        K92.1, Melena (includes Hematochezia) CPT copyright 2022 American Medical Association. All rights reserved. The codes documented in this report are preliminary and upon coder review may  be revised to meet current compliance requirements. Jonathon Bellows, MD Jonathon Bellows MD, MD 12/24/2022 9:07:55 AM This report has been signed electronically. Number of Addenda: 0 Note Initiated On: 12/24/2022 8:35 AM Estimated Blood Loss:  Estimated blood loss: none.      [facility]

## 2022-12-24 NOTE — Progress Notes (Signed)
PROGRESS NOTE    Jamie Koch  [id number] DOB: 1956-08-02 DOA: 12/22/2022 PCP: Pleas Koch, NP   Brief Narrative:  Jamie Koch is a 67 y.o. female with medical history significant for depression, hypothyroidism, dyslipidemia, and osteoarthritis, who presented to the emergency room with acute onset of melena.  The patient noted black tarry stools once.  She has been having diarrhea which has been bloody and intermittent since Saturday.  She denies any abdominal pain.  No nausea or vomiting or heartburn.  No cough or wheezing or dyspnea.  No dysuria, oliguria or hematuria or flank pain.  She admitted to palpitations without chest pain.  No fever or chills.   ED Course: When she came to the ER, temperature was 97.5 with normal other vital signs.  Labs revealed mild hypokalemia of 3.4 and blood glucose of 144 with total protein of 6.4 albumin of 4.  CBC showed WBC of 10.6 and mild anemia with hemoglobin 11.1 hematocrit 32.8 compared to 14.5 and 42.8 on 10/15/2021.  Blood group was A- with negative antibody screen.  Assessment & Plan:   Principal Problem:   GI bleeding Active Problems:   Anemia due to acute blood loss   Hypothyroidism   Dyslipidemia   GERD without esophagitis   Peripheral neuropathy   NSAID long-term use   Gastric ulcer  Acute blood loss anemia secondary to above all lower GI bleed: Patient came in with melena as well as noted blood per rectum.  She has not had any further episodes like that since admission.  GI was consulted, patient underwent EGD and colonoscopy today, was found to have shallow nonbleeding gastric ulcers and diverticulosis.  GI suspects mainly bleeding from diverticulosis but could also lead some point contributed by the ulcer.  Recommend 3 months of omeprazole 40 mg p.o. twice daily, starting on clear liquid diet and advancing slowly and observing another 24 hours.  Hemoglobin slightly dropping, 10.4.  Will monitor every 12 hours.  Acquired  hypothyroidism: Continue Synthroid.  Dyslipidemia: Continue statin.  Peripheral neuropathy continue Neurontin.  GERD: Continue PPI.  Hypokalemia: Will replace.  DVT prophylaxis: Place and maintain sequential compression device Start: 12/23/22 0143   Code Status: Full Code  Family Communication: None present at bedside.  Plan of care discussed with patient in length and he/she verbalized understanding and agreed with it.  Status is: Inpatient Remains inpatient appropriate because: Needs slow advancement of diet and another 24 hours of observation.   Estimated body mass index is 35.12 kg/m as calculated from the following:   Height as of this encounter: 5' 4"$  (1.626 m).   Weight as of this encounter: 92.8 kg.    Nutritional Assessment: Body mass index is 35.12 kg/m.Marland Kitchen Seen by dietician.  I agree with the assessment and plan as outlined below: Nutrition Status:        . Skin Assessment: I have examined the patient's skin and I agree with the wound assessment as performed by the wound care RN as outlined below:    Consultants:  GI  Procedures:  As above  Antimicrobials:  Anti-infectives (From admission, onward)    None         Subjective: Seen and examined.  She has no complaints.  She has no questions today.  Objective: Vitals:   12/24/22 0747 12/24/22 0938 12/24/22 0948 12/24/22 0958  BP: 116/63 (!) 112/58 129/70 130/70  Pulse: (!) 102 98 (!) 101 (!) 102  Resp: 16 (!) 21 20 14  $ Temp: 98.2 F (  36.8 C) 97.6 F (36.4 C)    TempSrc: Oral Temporal    SpO2: 95% 92% 100% 96%  Weight:      Height:        Intake/Output Summary (Last 24 hours) at 12/24/2022 1111 Last data filed at 12/23/2022 1619 Gross per 24 hour  Intake 1434.07 ml  Output --  Net 1434.07 ml   Filed Weights   12/22/22 2227 12/23/22 0825 12/23/22 0945  Weight: 94.8 kg 94.8 kg 92.8 kg    Examination:  General exam: Appears calm and comfortable  Respiratory system: Clear to  auscultation. Respiratory effort normal. Cardiovascular system: S1 & S2 heard, RRR. No JVD, murmurs, rubs, gallops or clicks. No pedal edema. Gastrointestinal system: Abdomen is nondistended, soft and nontender. No organomegaly or masses felt. Normal bowel sounds heard. Central nervous system: Alert and oriented. No focal neurological deficits. Extremities: Symmetric 5 x 5 power. Skin: No rashes, lesions or ulcers.  Psychiatry: Judgement and insight appear normal. Mood & affect appropriate.    Data Reviewed: I have personally reviewed following labs and imaging studies  CBC: Recent Labs  Lab 12/22/22 2229 12/23/22 0440 12/23/22 1708 12/23/22 2301 12/24/22 0456  WBC 10.6* 9.6 8.7  --  9.9  HGB 11.1* 10.0* 10.9* 11.2* 10.4*  HCT 32.8* 29.4* 32.3* 33.5* 30.7*  MCV 89.9 89.9 91.0  --  90.6  PLT 302 244 312  --  0000000   Basic Metabolic Panel: Recent Labs  Lab 12/22/22 2229 12/23/22 0440 12/24/22 0456  NA 137 140 138  K 3.4* 3.6 3.4*  CL 102 107 107  CO2 26 26 22  $ GLUCOSE 144* 116* 136*  BUN 19 16 6*  CREATININE 0.82 0.73 0.60  CALCIUM 9.0 8.5* 7.8*  MG 2.1  --   --    GFR: Estimated Creatinine Clearance: 75.3 mL/min (by C-G formula based on SCr of 0.6 mg/dL). Liver Function Tests: Recent Labs  Lab 12/22/22 2229  AST 26  ALT 24  ALKPHOS 43  BILITOT 0.5  PROT 6.4*  ALBUMIN 4.0   No results for input(s): "LIPASE", "AMYLASE" in the last 168 hours. No results for input(s): "AMMONIA" in the last 168 hours. Coagulation Profile: Recent Labs  Lab 12/23/22 2301  INR 1.2   Cardiac Enzymes: No results for input(s): "CKTOTAL", "CKMB", "CKMBINDEX", "TROPONINI" in the last 168 hours. BNP (last 3 results) No results for input(s): "PROBNP" in the last 8760 hours. HbA1C: No results for input(s): "HGBA1C" in the last 72 hours. CBG: Recent Labs  Lab 12/23/22 2302  GLUCAP 116*   Lipid Profile: No results for input(s): "CHOL", "HDL", "LDLCALC", "TRIG", "CHOLHDL",  "LDLDIRECT" in the last 72 hours. Thyroid Function Tests: No results for input(s): "TSH", "T4TOTAL", "FREET4", "T3FREE", "THYROIDAB" in the last 72 hours. Anemia Panel: Recent Labs    12/23/22 0440 12/23/22 0956  VITAMINB12  --  261  FOLATE 21.6  --   FERRITIN 13  --   TIBC 325  --   IRON 33  --    Sepsis Labs: No results for input(s): "PROCALCITON", "LATICACIDVEN" in the last 168 hours.  No results found for this or any previous visit (from the past 240 hour(s)).   Radiology Studies: No results found.  Scheduled Meds:  [MAR Hold] gabapentin  300 mg Oral QHS   [MAR Hold] pantoprazole (PROTONIX) IV  40 mg Intravenous Q12H   [MAR Hold] potassium chloride  40 mEq Oral Once   [MAR Hold] progesterone  100 mg Oral QPM   [MAR  Hold] simvastatin  40 mg Oral q1800   [MAR Hold] thyroid  15 mg Oral Q0600   [MAR Hold] thyroid  60 mg Oral Q0600   Continuous Infusions:  sodium chloride 20 mL/hr at 12/24/22 0856   0.9 % NaCl with KCl 20 mEq / L 100 mL/hr at 12/23/22 2154   [MAR Hold] potassium chloride       LOS: 2 days   Darliss Cheney, MD [facility]  12/24/2022, 11:11 AM   *Please note that this is a verbal dictation therefore any spelling or grammatical errors are due to the "[place] One" system interpretation.  Please page via [place] and do not message via secure chat for urgent patient care matters. Secure chat can be used for non urgent patient care matters.  How to contact the [facility] Attending or Consulting provider [place] or covering provider during after hours [place], for this patient?  Check the care team in [facility] and look for a) attending/consulting [facility] provider listed and b) the [facility] team listed. Page or secure chat [phone]. Log into [email or web address] and use [place]'s universal password to access. If you do not have the password, please contact the hospital operator. Locate the [facility] provider you are looking for under [facility] and page to a number that you  can be directly reached. If you still have difficulty reaching the provider, please page the [facility] (Director on Call) for the Hospitalists listed on amion for assistance.

## 2022-12-25 ENCOUNTER — Encounter: Payer: Self-pay | Admitting: Gastroenterology

## 2022-12-25 DIAGNOSIS — K921 Melena: Secondary | ICD-10-CM | POA: Diagnosis not present

## 2022-12-25 DIAGNOSIS — K5791 Diverticulosis of intestine, part unspecified, without perforation or abscess with bleeding: Secondary | ICD-10-CM

## 2022-12-25 HISTORY — DX: Diverticulosis of intestine, part unspecified, without perforation or abscess with bleeding: K57.91

## 2022-12-25 LAB — CBC
HCT: 29.8 % — ABNORMAL LOW (ref 36.0–46.0)
Hemoglobin: 10.1 g/dL — ABNORMAL LOW (ref 12.0–15.0)
MCH: 30.7 pg (ref 26.0–34.0)
MCHC: 33.9 g/dL (ref 30.0–36.0)
MCV: 90.6 fL (ref 80.0–100.0)
Platelets: 337 10*3/uL (ref 150–400)
RBC: 3.29 MIL/uL — ABNORMAL LOW (ref 3.87–5.11)
RDW: 12.8 % (ref 11.5–15.5)
WBC: 8.9 10*3/uL (ref 4.0–10.5)
nRBC: 0 % (ref 0.0–0.2)

## 2022-12-25 LAB — BASIC METABOLIC PANEL
Anion gap: 6 (ref 5–15)
BUN: 9 mg/dL (ref 8–23)
CO2: 27 mmol/L (ref 22–32)
Calcium: 8.5 mg/dL — ABNORMAL LOW (ref 8.9–10.3)
Chloride: 106 mmol/L (ref 98–111)
Creatinine, Ser: 0.76 mg/dL (ref 0.44–1.00)
GFR, Estimated: >60 mL/min (ref >60)
Glucose, Bld: 110 mg/dL — ABNORMAL HIGH (ref 70–99)
Potassium: 3.1 mmol/L — ABNORMAL LOW (ref 3.5–5.1)
Sodium: 139 mmol/L (ref 135–145)

## 2022-12-25 MED ORDER — POTASSIUM CHLORIDE CRYS ER 20 MEQ PO TBCR
40.0000 meq | EXTENDED_RELEASE_TABLET | ORAL | Status: DC
  Administered 2022-12-25: 40 meq via ORAL
  Filled 2022-12-25: qty 2

## 2022-12-25 MED ORDER — OMEPRAZOLE 40 MG PO CPDR: 40.0000 mg | DELAYED_RELEASE_CAPSULE | Freq: Two times a day (BID) | ORAL | 2 refills | Status: DC

## 2022-12-25 NOTE — Care Management Important Message (Signed)
Important Message  Patient Details  Name: Jamie Koch MRN: [id number] Date of Birth: 30-Mar-1956   Medicare Important Message Given:  Yes     Dannette Barbara 12/25/2022, 10:34 AM

## 2022-12-25 NOTE — Discharge Summary (Signed)
Physician Discharge Summary  Jamie Koch [id number] DOB: Feb 10, 1956 DOA: 12/22/2022  PCP: Pleas Koch, NP  Admit date: 12/22/2022 Discharge date: 12/25/2022 30 Day Unplanned Readmission Risk Score    Flowsheet Row ED to Hosp-Admission (Current) from 12/22/2022 in [place]  30 Day Unplanned Readmission Risk Score (%) 7.95 Filed at 12/25/2022 0801       This score is the patient's risk of an unplanned readmission within 30 days of being discharged (0 -100%). The score is based on dignosis, age, lab data, medications, orders, and past utilization.   Low:  0-14.9   Medium: 15-21.9   High: 22-29.9   Extreme: 30 and above          Admitted From: Home Disposition: Home  Recommendations for Outpatient Follow-up:  Follow up with PCP in 1-2 weeks Please obtain BMP/CBC in one week Follow-up with GI in 8 weeks for repeat EGD Please follow up with your PCP on the following pending results: Unresulted Labs (From admission, onward)    None         Home Health: None Equipment/Devices: None  Discharge Condition: Stable CODE STATUS: Full code Diet recommendation: Cardiac  Subjective: Seen and examined.  She has no complaints.  All questions answered.  Brief/Interim Summary: Jamie Koch is a 67 y.o. female with medical history significant for depression, hypothyroidism, dyslipidemia, and osteoarthritis, who presented to the emergency room with acute onset of melena.  The patient noted black tarry stools once.  She has been having diarrhea which has been bloody and intermittent since Saturday.  She denied any abdominal pain.     ED Course: When she came to the ER, temperature was 97.5 with normal other vital signs.  Labs revealed mild hypokalemia of 3.4 and blood glucose of 144 with total protein of 6.4 albumin of 4.  CBC showed WBC of 10.6 and mild anemia with hemoglobin 11.1 hematocrit 32.8 compared to 14.5 and 42.8 on 10/15/2021.  She  was admitted to hospital service and GI consulted.  Details below.   Acute blood loss anemia secondary to above all lower GI bleed: Report of melena and bright red blood per rectum.  She has not had any further episodes like that since admission.  GI was consulted, patient underwent EGD and colonoscopy on 12/24/2022, was found to have shallow nonbleeding gastric ulcers and diverticulosis.  GI suspects mainly bleeding from diverticulosis but some contusion from nonbleeding gastric ulcer.  GI recommend 3 months of omeprazole 40 mg p.o. twice daily and follow-up with GI in 8 weeks for repeat EGD. Hemoglobin slightly dropping, 10.4.  Patient did not require any blood transfusion during this hospitalization.  She is being discharged in stable condition today.  She is tolerating regular diet now.    Acquired hypothyroidism: Continue Synthroid.   Dyslipidemia: Continue statin.   Peripheral neuropathy continue Neurontin.   GERD: Continue PPI.   Hypokalemia: Low again, Will replace before discharge today.  Discharge plan was discussed with patient and/or family member and they verbalized understanding and agreed with it.  Discharge Diagnoses:  Principal Problem:   GI bleeding Active Problems:   Anemia due to acute blood loss   Hypothyroidism   Dyslipidemia   GERD without esophagitis   Peripheral neuropathy   NSAID long-term use   Gastric ulcer   Diverticulosis of intestine with bleeding    Discharge Instructions   Allergies as of 12/25/2022       Reactions   Covid-19 (mrna) Vaccine Other (  See Comments)   Pfizer covid-19 vaccine. Caused left arm tingling, headaches, burning skin, vaginal bleeding.   Lactose Intolerance (gi)    Upset stomach    Medroxyprogesterone Itching        Medication List     TAKE these medications    diphenhydrAMINE 25 MG tablet Commonly known as: BENADRYL Take 25 mg by mouth at bedtime as needed for allergies or sleep.   gabapentin 300 MG  capsule Commonly known as: NEURONTIN Take 1 capsule (300 mg total) by mouth at bedtime. For pain.   omeprazole 40 MG capsule Commonly known as: PRILOSEC Take 1 capsule (40 mg total) by mouth 2 (two) times daily. What changed:  medication strength how much to take when to take this   [place] 2 sprays into both nostrils daily as needed (allergies). Sove[place] Silver nasal spray   PRESCRIPTION MEDICATION Testosterone 100 mg pellets placed under the skin every 3 months   PRESCRIPTION MEDICATION Estradiol 6 mg pellets placed under the skin every 3 months   progesterone 100 MG capsule Commonly known as: PROMETRIUM Take 100 mg by mouth every evening.   simvastatin 40 MG tablet Commonly known as: ZOCOR TAKE 1/2 TABLET(20 MG) BY MOUTH DAILY for cholesterol. Office visit required for further refills.   thyroid 60 MG tablet Commonly known as: ARMOUR Take 60 mg by mouth daily before breakfast.   Armour Thyroid 15 MG tablet Generic drug: thyroid Take 1 tablet by mouth daily.   triamcinolone cream 0.1 % Commonly known as: KENALOG Apply 1 Application topically daily.   TYLENOL ARTHRITIS PAIN PO Take 2 tablets by mouth.   VITAMIN K2-VITAMIN D3 PO Take 3 tablets by mouth daily.        Follow-up Information     Pleas Koch, NP Follow up in 1 week(s).   Specialty: Internal Medicine Contact information: [place] [address] [phone]         Jonathon Bellows, MD Follow up in 8 week(s).   Specialty: Gastroenterology Contact information: [address] [place]  [address] [phone]                Allergies  Allergen Reactions   Covid-19 (Mrna) Vaccine Other (See Comments)    Pfizer covid-19 vaccine. Caused left arm tingling, headaches, burning skin, vaginal bleeding.   Lactose Intolerance (Gi)     Upset stomach    Medroxyprogesterone Itching    Consultations:  GI   Procedures/Studies: No results found.   Discharge Exam: Vitals:   12/25/22 0436 12/25/22 0813  BP: (!) 157/81 129/76  Pulse: 76 81  Resp: 20 18  Temp: 98.6 F (37 C) 98.4 F (36.9 C)  SpO2: 97% 97%   Vitals:   12/24/22 1545 12/24/22 2000 12/25/22 0436 12/25/22 0813  BP: (!) 156/84 (!) 167/84 (!) 157/81 129/76  Pulse: 98 89 76 81  Resp: 18  20 18  $ Temp: 99.2 F (37.3 C) 98.7 F (37.1 C) 98.6 F (37 C) 98.4 F (36.9 C)  TempSrc:   Oral Oral  SpO2: 97% 97% 97% 97%  Weight:      Height:        General: Pt is alert, awake, not in acute distress Cardiovascular: RRR, S1/S2 +, no rubs, no gallops Respiratory: CTA bilaterally, no wheezing, no rhonchi Abdominal: Soft, NT, ND, bowel sounds + Extremities: no edema, no cyanosis    The results of significant diagnostics from this hospitalization (including imaging, microbiology,  ancillary and laboratory) are listed below for reference.     Microbiology: No results found for this or any previous visit (from the past 240 hour(s)).   Labs: BNP (last 3 results) No results for input(s): "BNP" in the last 8760 hours. Basic Metabolic Panel: Recent Labs  Lab 12/22/22 2229 12/23/22 0440 12/24/22 0456 12/25/22 0857  NA 137 140 138 139  K 3.4* 3.6 3.4* 3.1*  CL 102 107 107 106  CO2 26 26 22 27  $ GLUCOSE 144* 116* 136* 110*  BUN 19 16 6* 9  CREATININE 0.82 0.73 0.60 0.76  CALCIUM 9.0 8.5* 7.8* 8.5*  MG 2.1  --   --   --    Liver Function Tests: Recent Labs  Lab 12/22/22 2229  AST 26  ALT 24  ALKPHOS 43  BILITOT 0.5  PROT 6.4*  ALBUMIN 4.0   No results for input(s): "LIPASE", "AMYLASE" in the last 168 hours. No results for input(s): "AMMONIA" in the last 168 hours. CBC: Recent Labs  Lab 12/23/22 0440 12/23/22 1708 12/23/22 2301 12/24/22 0456 12/24/22 1611 12/25/22 0857  WBC 9.6 8.7  --  9.9 10.3 8.9  HGB 10.0* 10.9* 11.2* 10.4* 10.1* 10.1*  HCT 29.4* 32.3* 33.5* 30.7* 29.6* 29.8*  MCV 89.9 91.0   --  90.6 90.8 90.6  PLT 244 312  --  318 319 337   Cardiac Enzymes: No results for input(s): "CKTOTAL", "CKMB", "CKMBINDEX", "TROPONINI" in the last 168 hours. BNP: Invalid input(s): "POCBNP" CBG: Recent Labs  Lab 12/23/22 2302  GLUCAP 116*   D-Dimer No results for input(s): "DDIMER" in the last 72 hours. Hgb A1c No results for input(s): "HGBA1C" in the last 72 hours. Lipid Profile No results for input(s): "CHOL", "HDL", "LDLCALC", "TRIG", "CHOLHDL", "LDLDIRECT" in the last 72 hours. Thyroid function studies No results for input(s): "TSH", "T4TOTAL", "T3FREE", "THYROIDAB" in the last 72 hours.  Invalid input(s): "FREET3" Anemia work up Recent Labs    12/23/22 0440 12/23/22 0956  VITAMINB12  --  261  FOLATE 21.6  --   FERRITIN 13  --   TIBC 325  --   IRON 33  --    Urinalysis    Component Value Date/Time   COLORURINE YELLOW (A) 06/15/2020 1229   APPEARANCEUR CLEAR (A) 06/15/2020 1229   LABSPEC 1.008 06/15/2020 1229   PHURINE 7.0 06/15/2020 1229   GLUCOSEU NEGATIVE 06/15/2020 1229   HGBUR LARGE (A) 06/15/2020 1229   BILIRUBINUR NEGATIVE 06/15/2020 1229   KETONESUR NEGATIVE 06/15/2020 1229   PROTEINUR 30 (A) 06/15/2020 1229   NITRITE NEGATIVE 06/15/2020 1229   LEUKOCYTESUR NEGATIVE 06/15/2020 1229   Sepsis Labs Recent Labs  Lab 12/23/22 1708 12/24/22 0456 12/24/22 1611 12/25/22 0857  WBC 8.7 9.9 10.3 8.9   Microbiology No results found for this or any previous visit (from the past 240 hour(s)).   Time coordinating discharge: Over 30 minutes  SIGNED:   Darliss Cheney, MD  [facility] 12/25/2022, 10:03 AM *Please note that this is a verbal dictation therefore any spelling or grammatical errors are due to the "[place] One" system interpretation. If 7PM-7AM, please contact night-coverage [email or web address]

## 2022-12-25 NOTE — Progress Notes (Signed)
Pt discharged per MD order. IV removed. Discharge instructions reviewed with pt. Pt verbalized understanding. All questions answered to pt satisfaction. Pt taken out in wheelchair by staff.

## 2022-12-26 ENCOUNTER — Other Ambulatory Visit: Payer: Self-pay | Admitting: Primary Care

## 2022-12-26 DIAGNOSIS — E785 Hyperlipidemia, unspecified: Secondary | ICD-10-CM

## 2022-12-28 ENCOUNTER — Telehealth: Payer: Self-pay | Admitting: *Deleted

## 2022-12-28 NOTE — Transitions of Care (Post Inpatient/ED Visit) (Signed)
   12/28/2022  Name: Jamie Koch MRN: [id number] DOB: [birth year]  Today's TOC FU Call Status: Today's TOC FU Call Status:: Successful TOC FU Call Competed TOC FU Call Complete Date: 12/28/22  Transition Care Management Follow-up Telephone Call Date of Discharge: 12/25/22 Discharge Facility: [facility]) Type of Discharge: Inpatient Admission Primary Inpatient Discharge Diagnosis:: rectal bleeding How have you been since you were released from the hospital?: Better (doing really good. I an still a little black in stool) Any questions or concerns?: No  Items Reviewed: Did you receive and understand the discharge instructions provided?: Yes Medications obtained and verified?: Yes (Medications Reviewed) Any new allergies since your discharge?: No Dietary orders reviewed?: Yes Type of Diet Ordered:: Cardiac diet Do you have support at home?: Yes People in Home: spouse Name of Support/Comfort Primary Source: [facility] and Equipment/Supplies: [place] Ordered?: No Any new equipment or medical supplies ordered?: No  Functional Questionnaire: Do you need assistance with bathing/showering or dressing?: No Do you need assistance with meal preparation?: No Do you need assistance with eating?: No Do you have difficulty maintaining continence: No Do you need assistance with getting out of bed/getting out of a chair/moving?: No Do you have difficulty managing or taking your medications?: No  Folllow up appointments reviewed: PCP Follow-up appointment confirmed?: Yes Date of PCP follow-up appointment?: 01/01/23 Follow-up Provider: Corinda Gubler NP [facility] Follow-up appointment confirmed?: Yes Date of Specialist follow-up appointment?: 02/22/23 Follow-Up Specialty Provider:: Jonathon Bellows 1:45 Do you need transportation to your follow-up appointment?: No Do you understand care options if your condition(s) worsen?:  Yes-patient verbalized understanding  SDOH Interventions Today    Flowsheet Row Most Recent Value  SDOH Interventions   Food Insecurity Interventions Intervention Not Indicated  Housing Interventions Intervention Not Indicated  Transportation Interventions Intervention Not Indicated            [place] Management [phone]

## 2022-12-31 NOTE — Anesthesia Postprocedure Evaluation (Signed)
Anesthesia Post Note  Patient: Marilene Goldberg  Procedure(s) Performed: ESOPHAGOGASTRODUODENOSCOPY (EGD) WITH PROPOFOL COLONOSCOPY WITH PROPOFOL  Patient location during evaluation: Endoscopy Anesthesia Type: General Level of consciousness: awake and alert Pain management: pain level controlled Vital Signs Assessment: post-procedure vital signs reviewed and stable Respiratory status: spontaneous breathing, nonlabored ventilation, respiratory function stable and patient connected to nasal cannula oxygen Cardiovascular status: blood pressure returned to baseline and stable Postop Assessment: no apparent nausea or vomiting Anesthetic complications: no   No notable events documented.   Last Vitals:  Vitals:   12/25/22 0436 12/25/22 0813  BP: (!) 157/81 129/76  Pulse: 76 81  Resp: 20 18  Temp: 37 C 36.9 C  SpO2: 97% 97%    Last Pain:  Vitals:   12/25/22 0847  TempSrc:   PainSc: 6                  Martha Clan

## 2023-01-01 ENCOUNTER — Inpatient Hospital Stay: Payer: Medicare Other | Admitting: Primary Care

## 2023-01-06 ENCOUNTER — Ambulatory Visit (INDEPENDENT_AMBULATORY_CARE_PROVIDER_SITE_OTHER): Payer: Medicare Other | Admitting: Primary Care

## 2023-01-06 ENCOUNTER — Encounter: Payer: Self-pay | Admitting: Primary Care

## 2023-01-06 VITALS — BP 134/78 | HR 95 | Temp 98.1°F | Ht 64.0 in | Wt 209.0 lb

## 2023-01-06 DIAGNOSIS — K253 Acute gastric ulcer without hemorrhage or perforation: Secondary | ICD-10-CM

## 2023-01-06 DIAGNOSIS — K921 Melena: Secondary | ICD-10-CM | POA: Diagnosis not present

## 2023-01-06 LAB — BASIC METABOLIC PANEL
BUN: 14 mg/dL (ref 6–23)
CO2: 30 mEq/L (ref 19–32)
Calcium: 9.5 mg/dL (ref 8.4–10.5)
Chloride: 104 mEq/L (ref 96–112)
Creatinine, Ser: 0.81 mg/dL (ref 0.40–1.20)
GFR: 75.17 mL/min (ref >60.00)
Glucose, Bld: 80 mg/dL (ref 70–99)
Potassium: 3.8 mEq/L (ref 3.5–5.1)
Sodium: 140 mEq/L (ref 135–145)

## 2023-01-06 LAB — CBC
HCT: 31.7 % — ABNORMAL LOW (ref 36.0–46.0)
Hemoglobin: 10.8 g/dL — ABNORMAL LOW (ref 12.0–15.0)
MCHC: 34 g/dL (ref 30.0–36.0)
MCV: 88.6 fl (ref 78.0–100.0)
Platelets: 384 10*3/uL (ref 150.0–400.0)
RBC: 3.57 Mil/uL — ABNORMAL LOW (ref 3.87–5.11)
RDW: 13.8 % (ref 11.5–15.5)
WBC: 8.4 10*3/uL (ref 4.0–10.5)

## 2023-01-06 NOTE — Progress Notes (Signed)
Established Patient Office Visit  Subjective   Patient ID: Jamie Koch, female    DOB: August 09, 1956  Age: 67 y.o. MRN: [id number]  Chief Complaint  Patient presents with   Hospitalization Follow-up         HPI  Jamie Koch is a 67 year old female with past medical history of hypothyroidism, OA, hyperlipidema, IDA, GERD.   She was admitted in the hospital for three days (12-22-22 to 12-25-22) for rectal bleeding for a week prior to admission. She had been taking NSAIDs for arthritis prior to going in. During her hospitalization, she had an endoscopy which revealed five non-bleeding supercficial gastric ulcers with a clean ulcer base in the gastric antrum with the largest lesion being 5 mm. She was started on Omeprazole 40 mg BID for 3 months and to repeat an EGD in 8 weeks. She has a follow up scheduled for April 15, 24. She also had a colonoscopy which revealed a multiple large-mouthed diverticula in the left colon. Red blood was found in the rectum, in the sigmoid colon and in the descending colon. No active bleeding was seen. GI believed that likely she had a diverticular bleed and probably a smaller bleed from the tiny ulcers in the stomach. Recommend getting a CT-A or tagged RBC scan if any further bleeding occurs.  and found a gastric ulcer and left diverticulosis. It was presumed that her bleeding was from the diverticulosis.   Since then, she had two days of light rectal bleeding after discharge and then it stopped. No more blood since then. She denies any nausea, vomiting, constipation or diarrhea.   She does have upper abdominal pain, intermittent, that she feels comes from hunger. It does goes away eating. She did have a laparoscopic hysterectomy in August and is unsure if her pain is from there.She has been avoiding NSAIDs and spicy foods, nuts since the procedures. She has been taking Tylenol and gapapentin for her pain. She did receive the Iron infusion during her  hospitalization.   Patient Active Problem List   Diagnosis Date Noted   Diverticulosis of intestine with bleeding 12/25/2022   Gastric ulcer 12/24/2022   GERD without esophagitis 12/23/2022   Dyslipidemia 12/23/2022   Peripheral neuropathy 12/23/2022   Anemia due to acute blood loss 12/23/2022   NSAID long-term use 12/23/2022   GI bleeding 12/22/2022   Rectal bleeding 12/21/2022   Acute neck pain 10/15/2021   Osteopenia 10/15/2021   Welcome to Medicare preventive visit 09/25/2020   Prediabetes 07/11/2019   Closed fracture of phalanx of foot 08/05/2017   Right foot injury, initial encounter 05/31/2017   Preventative health care 10/19/2016   Iron deficiency anemia 04/17/2016   Hypothyroidism 04/17/2016   Hyperlipidemia 04/17/2016   Osteoarthritis 04/17/2016   Fracture of head of radius 04/14/2016   Past Medical History:  Diagnosis Date   Arthritis    Chicken pox    Depression    Eye irritation 07/17/2020   Hay fever    High cholesterol    Hypothyroidism    Osteoporosis    PMB (postmenopausal bleeding) 07/17/2020   Thyroid dysfunction    Past Surgical History:  Procedure Laterality Date   COLONOSCOPY WITH PROPOFOL N/A 12/24/2022   Procedure: COLONOSCOPY WITH PROPOFOL;  Surgeon: Jonathon Bellows, MD;  Location: [facility] ENDOSCOPY;  Service: Endoscopy;  Laterality: N/A;   DILATATION & CURETTAGE/HYSTEROSCOPY WITH MYOSURE N/A 07/29/2020   Procedure: [place] RESECTION OF ENDOMETRIAL POLYP;  Surgeon: Boykin Nearing, MD;  Location: [facility] ORS;  Service: Gynecology;  Laterality: N/A;   ESOPHAGOGASTRODUODENOSCOPY (EGD) WITH PROPOFOL N/A 12/24/2022   Procedure: ESOPHAGOGASTRODUODENOSCOPY (EGD) WITH PROPOFOL;  Surgeon: Jonathon Bellows, MD;  Location: [facility] ENDOSCOPY;  Service: Endoscopy;  Laterality: N/A;   LAPAROSCOPIC TOTAL HYSTERECTOMY  07/02/2022   PARTIAL KNEE ARTHROPLASTY Left    TONSILLECTOMY AND ADENOIDECTOMY  1964   TOTAL KNEE  ARTHROPLASTY Right    Social History   Tobacco Use   Smoking status: Never   Smokeless tobacco: Never  Vaping Use   Vaping Use: Never used  Substance Use Topics   Alcohol use: No   Drug use: No   History reviewed. No pertinent family history. Allergies  Allergen Reactions   Covid-19 (Mrna) Vaccine Other (See Comments)    Pfizer covid-19 vaccine. Caused left arm tingling, headaches, burning skin, vaginal bleeding.   Lactose Intolerance (Gi)     Upset stomach    Medroxyprogesterone Itching      Review of Systems  Constitutional:  Negative for chills and fever.  Eyes:  Negative for blurred vision.  Respiratory:  Negative for shortness of breath.   Cardiovascular:  Negative for chest pain.  Gastrointestinal:  Negative for blood in stool, constipation, diarrhea, heartburn, nausea and vomiting.  Neurological:  Negative for dizziness and headaches.      Objective:     BP 134/78   Pulse 95   Temp 98.1 F (36.7 C) (Temporal)   Ht '5\' 4"'$  (1.626 m)   Wt 209 lb (94.8 kg)   SpO2 97%   BMI 35.87 kg/m  BP Readings from Last 3 Encounters:  01/06/23 134/78  12/25/22 129/76  12/21/22 130/72   Wt Readings from Last 3 Encounters:  01/06/23 209 lb (94.8 kg)  12/23/22 204 lb 9.4 oz (92.8 kg)  12/21/22 209 lb (94.8 kg)      Physical Exam Vitals and nursing note reviewed.  Constitutional:      Appearance: Normal appearance.  Cardiovascular:     Rate and Rhythm: Normal rate and regular rhythm.     Pulses: Normal pulses.     Heart sounds: Normal heart sounds.  Pulmonary:     Effort: Pulmonary effort is normal.     Breath sounds: Normal breath sounds.  Abdominal:     General: Bowel sounds are normal.  Skin:    General: Skin is warm.  Neurological:     Mental Status: She is alert and oriented to person, place, and time.      No results found for any visits on 01/06/23.     The 10-year ASCVD risk score (Arnett DK, et al., 2019) is: 7.5%    Assessment & Plan:    Problem List Items Addressed This Visit       Digestive   GI bleeding - Primary    Controlled.   Hospitalization notes, labs, procedure notes reviewed.   Discussed with patient to continue omeprazole per GI and to avoid using NSAIDs.   Follow up with GI in April.   CBC and BMP pending.      Relevant Orders   CBC   Basic Metabolic Panel    No follow-ups on file.    Tinnie Gens, BSN-RN, DNP STUDENT

## 2023-01-06 NOTE — Assessment & Plan Note (Addendum)
Resolved.   Hospitalization notes, labs, procedure notes reviewed.   Discussed with patient to continue omeprazole 40 mg BID and to avoid using NSAIDs.   Follow up with GI in April.   CBC and BMP pending.  I evaluated patient, was consulted regarding treatment, and agree with assessment and plan per Tinnie Gens, RN, DNP student.   Allie Bossier, NP-C

## 2023-01-06 NOTE — Progress Notes (Signed)
Subjective:    Patient ID: Jamie Koch, female    DOB: 15-Mar-1956, 67 y.o.   MRN: [id number]  HPI  Jamie Koch is a very pleasant 67 y.o. female with a history of hypothyroidism, GI bleed, GERD, iron deficiency anemia, prediabetes, hyperlipidemia who presents today for hospital follow up.  She presented to [facility] ED on 12/22/22 for a four day history of rectal bleeding occurring 3-4 times daily with stool. During her stay in the ED her hemaglobin was noted to have dropped from 11.1 from 15.9 in August. She was admitted for GI bleed and for further evaluation.   During her hospital stay she was treated with IV fluids and kept NPO. GI consulted who recommended endoscopy and colonoscopy in the setting of NSAID use. Endoscopy revealed shallow, non bleeding gastric ulcers and colonoscopy revealed diverticulosis. GI believed GI bleeding to be secondary to diverticulosis and recommended omeprazole 40 mg BID x 3 months and outpatient GI follow up for repeat endoscopy. She did not require blood transfusion but hemoglobin did reduce down to 10.4 at discharge. She was discharged home on 12/25/22 with recommendation for PCP and GI follow up, repeat CBC and BMP.   Since her hospitalization she experienced two days of light bright red rectal bleeding just after discharge, no bleeding since. She does notice upper bilateral abdominal discomfort that is relieved after eating. Overall she's feeling much better. She is compliant to omeprazole 40 mg BID.   She has avoided NSAID's, nuts/seeds, spicy foods since discharge. She has increased consumption of bananas given her low potassium level. She has a GI follow up scheduled for mid April 2024.    Review of Systems  Cardiovascular:  Negative for chest pain.  Gastrointestinal:  Positive for abdominal pain. Negative for blood in stool, constipation, diarrhea, nausea and vomiting.  Neurological:  Negative for dizziness.         Past Medical History:   Diagnosis Date   Arthritis    Chicken pox    Depression    Eye irritation 07/17/2020   Hay fever    High cholesterol    Hypothyroidism    Osteoporosis    PMB (postmenopausal bleeding) 07/17/2020   Thyroid dysfunction     Social History   Socioeconomic History   Marital status: Married    Spouse name: Not on file   Number of children: Not on file   Years of education: Not on file   Highest education level: Not on file  Occupational History   Not on file  Tobacco Use   Smoking status: Never   Smokeless tobacco: Never  Vaping Use   Vaping Use: Never used  Substance and Sexual Activity   Alcohol use: No   Drug use: No   Sexual activity: Yes  Other Topics Concern   Not on file  Social History Narrative   Married.   1 child.   Retired. Worked in Insurance claims handler.    Enjoys reading, gardening.    Social Determinants of Health   Financial Resource Strain: Low Risk  (02/03/2022)   Overall Financial Resource Strain (CARDIA)    Difficulty of Paying Living Expenses: Not hard at all  Food Insecurity: No Food Insecurity (12/28/2022)   Hunger Vital Sign    Worried About Running Out of Food in the Last Year: Never true    Ran Out of Food in the Last Year: Never true  Transportation Needs: No Transportation Needs (12/28/2022)   PRAPARE - Transportation    Lack of  Transportation (Medical): No    Lack of Transportation (Non-Medical): No  Physical Activity: Inactive (02/03/2022)   Exercise Vital Sign    Days of Exercise per Week: 0 days    Minutes of Exercise per Session: 0 min  Stress: No Stress Concern Present (02/03/2022)   [place]    Feeling of Stress : Not at all  Social Connections: Moderately Integrated (02/03/2022)   Social Connection and Isolation Panel [NHANES]    Frequency of Communication with Friends and Family: More than three times a week    Frequency of Social Gatherings with Friends and Family:  Never    Attends Religious Services: More than 4 times per year    Active Member of [employer] or Organizations: No    Attends Archivist Meetings: Never    Marital Status: Married  Human resources officer Violence: Not At Risk (12/23/2022)   Humiliation, Afraid, Rape, and Kick questionnaire    Fear of Current or Ex-Partner: No    Emotionally Abused: No    Physically Abused: No    Sexually Abused: No    Past Surgical History:  Procedure Laterality Date   COLONOSCOPY WITH PROPOFOL N/A 12/24/2022   Procedure: COLONOSCOPY WITH PROPOFOL;  Surgeon: Jonathon Bellows, MD;  Location: [facility] ENDOSCOPY;  Service: Endoscopy;  Laterality: N/A;   DILATATION & CURETTAGE/HYSTEROSCOPY WITH MYOSURE N/A 07/29/2020   Procedure: [place] RESECTION OF ENDOMETRIAL POLYP;  Surgeon: Schermerhorn, Gwen Her, MD;  Location: [facility] ORS;  Service: Gynecology;  Laterality: N/A;   ESOPHAGOGASTRODUODENOSCOPY (EGD) WITH PROPOFOL N/A 12/24/2022   Procedure: ESOPHAGOGASTRODUODENOSCOPY (EGD) WITH PROPOFOL;  Surgeon: Jonathon Bellows, MD;  Location: [facility] ENDOSCOPY;  Service: Endoscopy;  Laterality: N/A;   LAPAROSCOPIC TOTAL HYSTERECTOMY  07/02/2022   PARTIAL KNEE ARTHROPLASTY Left    TONSILLECTOMY AND ADENOIDECTOMY  1964   TOTAL KNEE ARTHROPLASTY Right     History reviewed. No pertinent family history.  Allergies  Allergen Reactions   Covid-19 (Mrna) Vaccine Other (See Comments)    Pfizer covid-19 vaccine. Caused left arm tingling, headaches, burning skin, vaginal bleeding.   Lactose Intolerance (Gi)     Upset stomach    Medroxyprogesterone Itching    Current Outpatient Medications on File Prior to Visit  Medication Sig Dispense Refill   Acetaminophen (TYLENOL ARTHRITIS PAIN PO) Take 2 tablets by mouth.     ARMOUR THYROID 15 MG tablet Take 1 tablet by mouth daily.     diphenhydrAMINE (BENADRYL) 25 MG tablet Take 25 mg by mouth at bedtime as needed for allergies or sleep.      Ferrous Sulfate Dried ([facility] SLOW RELEASE IRON) 45 MG TBCR Take by mouth.     gabapentin (NEURONTIN) 300 MG capsule Take 1 capsule (300 mg total) by mouth at bedtime. For pain. 90 capsule 3   omeprazole (PRILOSEC) 40 MG capsule Take 1 capsule (40 mg total) by mouth 2 (two) times daily. 60 capsule 2   OVER THE COUNTER MEDICATION Place 2 sprays into both nostrils daily as needed (allergies). Sovereign Silver nasal spray     PRESCRIPTION MEDICATION Testosterone 100 mg pellets placed under the skin every 3 months     PRESCRIPTION MEDICATION Estradiol 6 mg pellets placed under the skin every 3 months     progesterone (PROMETRIUM) 100 MG capsule Take 100 mg by mouth every evening.     simvastatin (ZOCOR) 40 MG tablet TAKE 1/2 TABLET BY MOUTH DAILY FOR CHOLESTEROL 45 tablet 2  thyroid (ARMOUR) 60 MG tablet Take 60 mg by mouth daily before breakfast.     Vitamin D-Vitamin K (VITAMIN K2-VITAMIN D3 PO) Take 3 tablets by mouth daily.     triamcinolone cream (KENALOG) 0.1 % Apply 1 Application topically daily. (Patient not taking: Reported on 01/06/2023)     No current facility-administered medications on file prior to visit.    BP 134/78   Pulse 95   Temp 98.1 F (36.7 C) (Temporal)   Ht '5\' 4"'$  (1.626 m)   Wt 209 lb (94.8 kg)   SpO2 97%   BMI 35.87 kg/m  Objective:   Physical Exam Cardiovascular:     Rate and Rhythm: Normal rate and regular rhythm.  Pulmonary:     Effort: Pulmonary effort is normal.     Breath sounds: Normal breath sounds.  Abdominal:     Palpations: Abdomen is soft.     Tenderness: There is no abdominal tenderness.  Musculoskeletal:     Cervical back: Neck supple.  Skin:    General: Skin is warm and dry.           Assessment & Plan:  Gastrointestinal hemorrhage with melena Assessment & Plan: Resolved.   Hospitalization notes, labs, procedure notes reviewed.   Discussed with patient to continue omeprazole 40 mg BID and to avoid using NSAIDs.   Follow up with  GI in April.   CBC and BMP pending.  I evaluated patient, was consulted regarding treatment, and agree with assessment and plan per Tinnie Gens, RN, DNP student.   Allie Bossier, NP-C   Orders: -     CBC -     Basic metabolic panel  Acute gastric ulcer without hemorrhage or perforation Assessment & Plan: Recent hospitalization. Discouraged use of NSAIDs  Continue omeprazole 40 mg BID.  Follow up with GI.         Pleas Koch, NP

## 2023-01-06 NOTE — Patient Instructions (Signed)
Stop by the lab prior to leaving today. I will notify you of your results once received.  Glad you are doing better.    It was a pleasure to see you today!

## 2023-01-06 NOTE — Assessment & Plan Note (Signed)
Recent hospitalization. Discouraged use of NSAIDs  Continue omeprazole 40 mg BID.  Follow up with GI.

## 2023-02-20 ENCOUNTER — Encounter: Payer: Self-pay | Admitting: Gastroenterology

## 2023-02-22 ENCOUNTER — Encounter: Payer: Self-pay | Admitting: Gastroenterology

## 2023-02-22 ENCOUNTER — Other Ambulatory Visit: Payer: Self-pay

## 2023-02-22 ENCOUNTER — Telehealth: Payer: Self-pay | Admitting: Gastroenterology

## 2023-02-22 ENCOUNTER — Ambulatory Visit (INDEPENDENT_AMBULATORY_CARE_PROVIDER_SITE_OTHER): Payer: Medicare Other | Admitting: Gastroenterology

## 2023-02-22 VITALS — BP 146/81 | HR 80 | Temp 98.7°F | Ht 64.0 in | Wt 214.4 lb

## 2023-02-22 DIAGNOSIS — Z1211 Encounter for screening for malignant neoplasm of colon: Secondary | ICD-10-CM

## 2023-02-22 DIAGNOSIS — K279 Peptic ulcer, site unspecified, unspecified as acute or chronic, without hemorrhage or perforation: Secondary | ICD-10-CM

## 2023-02-22 DIAGNOSIS — D500 Iron deficiency anemia secondary to blood loss (chronic): Secondary | ICD-10-CM

## 2023-02-22 DIAGNOSIS — K253 Acute gastric ulcer without hemorrhage or perforation: Secondary | ICD-10-CM | POA: Diagnosis not present

## 2023-02-22 MED ORDER — NA SULFATE-K SULFATE-MG SULF 17.5-3.13-1.6 GM/177ML PO SOLN: 354.0000 mL | Freq: Once | ORAL | 0 refills | Status: AC

## 2023-02-22 NOTE — Progress Notes (Signed)
Jamie Repress, MD [address]  [address]  [place], [place] [address]  Main: [phone]  Fax: [phone]    Gastroenterology Consultation  Referring Provider:     Doreene Nest, NP Primary Care Physician:  Jamie Nest, NP Primary Gastroenterologist:  Dr. Arlyss Koch Reason for Consultation: History of peptic ulcer disease        HPI:   Jamie Koch is a 67 y.o. female referred by Jamie Nest, NP  for consultation & management of peptic ulcer disease.  Patient was admitted to [facility] in February secondary to hematochezia, followed by dark red blood and melena.  Has iron deficiency anemia.  Underwent upper endoscopy which revealed several small clean-based ulcers.  Colonoscopy was fair prep, old blood in the left colon only.  She was taking ibuprofen several times daily for arthritis pain.  Patient was discharged on omeprazole 40 mg p.o. twice daily.  She has been taking it consistently, also oral iron daily.  She does not have any GI concerns today.  She reports that she is no longer experiencing epigastric discomfort.  NSAIDs: None  Antiplts/Anticoagulants/Anti thrombotics: None  GI Procedures: EGD and colonoscopy 12/24/2022 Normal duodenum, normal esophagus 5 nonbleeding superficial gastric ulcers with clean ulcer base along the greater curvature of the stomach  Fair prep Left colon diverticulosis Blood in the rectum, sigmoid colon and the descending colon Normal retroflexion in the rectum Past Medical History:  Diagnosis Date   Arthritis    Chicken pox    Depression    Eye irritation 07/17/2020   Hay fever    High cholesterol    Hypothyroidism    Osteoporosis    PMB (postmenopausal bleeding) 07/17/2020   Thyroid dysfunction     Past Surgical History:  Procedure Laterality Date   COLONOSCOPY WITH PROPOFOL N/A 12/24/2022   Procedure: COLONOSCOPY WITH PROPOFOL;  Surgeon: Jamie Mood, MD;  Location: [facility] ENDOSCOPY;  Service: Endoscopy;   Laterality: N/A;   DILATATION & CURETTAGE/HYSTEROSCOPY WITH MYOSURE N/A 07/29/2020   Procedure: FRACTIONAL DILATATION & CURETTAGE/HYSTEROSCOPY WITH MYOSURE RESECTION OF ENDOMETRIAL POLYP;  Surgeon: Schermerhorn, Ihor Austin, MD;  Location: [facility] ORS;  Service: Gynecology;  Laterality: N/A;   ESOPHAGOGASTRODUODENOSCOPY (EGD) WITH PROPOFOL N/A 12/24/2022   Procedure: ESOPHAGOGASTRODUODENOSCOPY (EGD) WITH PROPOFOL;  Surgeon: Jamie Mood, MD;  Location: [facility] ENDOSCOPY;  Service: Endoscopy;  Laterality: N/A;   LAPAROSCOPIC TOTAL HYSTERECTOMY  07/02/2022   PARTIAL KNEE ARTHROPLASTY Left    TONSILLECTOMY AND ADENOIDECTOMY  1964   TOTAL KNEE ARTHROPLASTY Right     .[place]   No family history on file.   Social History   Tobacco Use   Smoking status: Never   Smokeless tobacco: Never  Vaping Use   Vaping Use: Never used  Substance Use Topics   Alcohol use: No   Drug use: No    Allergies as of 02/22/2023 - Review Complete 02/22/2023  Allergen Reaction Noted   Covid-19 (mrna) vaccine Other (See Comments) 07/16/2020   Lactose intolerance (gi)  07/16/2020   Medroxyprogesterone Itching 07/16/2020    Review of Systems:    All systems reviewed and negative except where noted in HPI.   Physical Exam:  BP (!) 146/81 (BP Location: Right Arm, Patient Position: Sitting, Cuff Size: Normal)   Pulse 80   Temp 98.7 F (37.1 C) (Oral)   Ht  (1.626 m)   Wt 214 lb 6 oz (97.2 kg)   BMI 36.80 kg/m  No LMP recorded. Patient has had  a hysterectomy.  General:   Alert,  Well-developed, well-nourished, pleasant and cooperative in NAD Head:  Normocephalic and atraumatic. Eyes:  Sclera clear, no icterus.   Conjunctiva pink. Ears:  Normal auditory acuity. Nose:  No deformity, discharge, or lesions. Mouth:  No deformity or lesions,oropharynx pink & moist. Neck:  Supple; no masses or thyromegaly. Lungs:  Respirations even and unlabored.  Clear throughout to auscultation.   No wheezes, crackles, or  rhonchi. No acute distress. Heart:  Regular rate and rhythm; no murmurs, clicks, rubs, or gallops. Abdomen:  Normal bowel sounds. Soft, non-tender and non-distended without masses, hepatosplenomegaly or hernias noted.  No guarding or rebound tenderness.   Rectal: Not performed Msk:  Symmetrical without gross deformities. Good, equal movement & strength bilaterally. Pulses:  Normal pulses noted. Extremities:  No clubbing or edema.  No cyanosis. Neurologic:  Alert and oriented x3;  grossly normal neurologically. Skin:  Intact without significant lesions or rashes. No jaundice. Psych:  Alert and cooperative. Normal Koch and affect.  Imaging Studies: None  Assessment and Plan:   [name] is a 67 y.o. female with obesity, hypothyroidism is seen in consultation for follow-up of peptic ulcer disease in setting of NSAID use  Peptic ulcer disease, several gastric ulcers based on EGD in 12/2022 Recommend repeat EGD to confirm healing of the ulcers Continue omeprazole 40 mg p.o. twice daily before meals, once ulcers have healed, decrease omeprazole to 40 mg daily Continue to avoid heavy NSAID use  Iron deficiency anemia Continue oral iron every other day Recheck CBC and iron panel today  Screening colonoscopy Recommend colonoscopy because previous colonoscopy was suboptimal, patient is agreeable  I have discussed alternative options, risks & benefits,  which include, but are not limited to, bleeding, infection, perforation,respiratory complication & drug reaction.  The patient agrees with this plan & written consent will be obtained.    Follow up as needed   Jamie Repress, MD

## 2023-02-22 NOTE — Telephone Encounter (Signed)
Patient calling stating that she does not want to do the colonoscopy. Just wants the upper EGD. Requesting a call from [place].

## 2023-02-22 NOTE — Telephone Encounter (Signed)
Please advise if colonoscopy can be taking off patient is declining having the colonoscopy

## 2023-02-22 NOTE — Telephone Encounter (Signed)
Because of the previous colonoscopy being fair prep, I recommended repeat colonoscopy with 2-day prep to evaluate for any small or flat polyps.  If patient is not willing to undergo repeat colonoscopy, please document and schedule only upper endoscopy  Jenaye Rickert

## 2023-02-23 ENCOUNTER — Telehealth: Payer: Self-pay

## 2023-02-23 LAB — CBC
Hematocrit: 36.2 % (ref 34.0–46.6)
Hemoglobin: 11.7 g/dL (ref 11.1–15.9)
MCH: 25.7 pg — ABNORMAL LOW (ref 26.6–33.0)
MCHC: 32.3 g/dL (ref 31.5–35.7)
MCV: 80 fL (ref 79–97)
Platelets: 350 10*3/uL (ref 150–450)
RBC: 4.55 x10E6/uL (ref 3.77–5.28)
RDW: 14.5 % (ref 11.7–15.4)
WBC: 7.6 10*3/uL (ref 3.4–10.8)

## 2023-02-23 LAB — IRON,TIBC AND FERRITIN PANEL
Ferritin: 9 ng/mL — ABNORMAL LOW (ref 15–150)
Iron Saturation: 7 % — CL (ref 15–55)
Iron: 25 ug/dL — ABNORMAL LOW (ref 27–139)
Total Iron Binding Capacity: 384 ug/dL (ref 250–450)
UIBC: 359 ug/dL (ref 118–369)

## 2023-02-23 NOTE — Telephone Encounter (Signed)
Patient verbalized understanding of results. She is will take the iron pills for 3 months and recheck blood work in 3 months

## 2023-02-23 NOTE — Telephone Encounter (Signed)
Patient states she just does not want to have a colonoscopy done at this time. She states she only wants a EGD done and she might will have a colonoscopy done at a later time. Called ENDO and talk to [facility] and she will take out the colonoscopy and only put the EGD.

## 2023-02-23 NOTE — Telephone Encounter (Signed)
-----   Message from Toney Reil, MD sent at 02/23/2023 11:04 AM EDT ----- Please inform patient that her anemia has resolved.  She still has very low iron deficiency.  Recommend to continue oral iron every other day for the next 3 months and recheck iron levels in 3 months  Rohini Vanga

## 2023-02-23 NOTE — Telephone Encounter (Signed)
Called patient back in telephone call

## 2023-03-17 ENCOUNTER — Encounter: Payer: Self-pay | Admitting: Gastroenterology

## 2023-03-23 ENCOUNTER — Encounter: Payer: Self-pay | Admitting: Gastroenterology

## 2023-03-23 NOTE — Anesthesia Preprocedure Evaluation (Addendum)
Anesthesia Evaluation  Patient identified by MRN, date of birth, ID band Patient awake    Reviewed: Allergy & Precautions, H&P , NPO status , Patient's Chart, lab work & pertinent test results, reviewed documented beta blocker date and time   History of Anesthesia Complications Negative for: history of anesthetic complications  Airway Mallampati: III  TM Distance: >3 FB Neck ROM: full    Dental  (+) Teeth Intact, Dental Advidsory Given, Caps Permanent bridge on bottom left:   Pulmonary neg pulmonary ROS   Pulmonary exam normal        Cardiovascular Exercise Tolerance: Poor negative cardio ROS Normal cardiovascular exam Rhythm:regular Rate:Normal     Neuro/Psych negative neurological ROS  negative psych ROS   GI/Hepatic Neg liver ROS, PUD,GERD  ,,  Endo/Other  neg diabetesHypothyroidism    Renal/GU negative Renal ROS  negative genitourinary   Musculoskeletal  (+) Arthritis ,    Abdominal  (+) + obese  Peds  Hematology negative hematology ROS (+)   Anesthesia Other Findings Past Medical History: No date: Arthritis No date: Chicken pox No date: Depression No date: Hay fever No date: High cholesterol No date: Hypothyroidism No date: Osteoporosis No date: Thyroid dysfunction Past Surgical History: No date: PARTIAL KNEE ARTHROPLASTY; Left 1964: TONSILLECTOMY AND ADENOIDECTOMY No date: TOTAL KNEE ARTHROPLASTY; Right   Reproductive/Obstetrics negative OB ROS                             Anesthesia Physical Anesthesia Plan  ASA: 2  Anesthesia Plan: General   Post-op Pain Management:    Induction: Intravenous  PONV Risk Score and Plan: 3 and Propofol infusion and TIVA  Airway Management Planned: Natural Airway and Nasal Cannula  Additional Equipment:   Intra-op Plan:   Post-operative Plan:   Informed Consent: I have reviewed the patients History and Physical, chart, labs  and discussed the procedure including the risks, benefits and alternatives for the proposed anesthesia with the patient or authorized representative who has indicated his/her understanding and acceptance.     Dental Advisory Given  Plan Discussed with: CRNA  Anesthesia Plan Comments:         Anesthesia Quick Evaluation

## 2023-03-24 ENCOUNTER — Ambulatory Visit: Payer: Medicare Other | Admitting: Anesthesiology

## 2023-03-24 ENCOUNTER — Ambulatory Visit
Admission: RE | Admit: 2023-03-24 | Discharge: 2023-03-24 | Disposition: A | Discharge status: Home / Self Care | Payer: Medicare Other | Attending: Gastroenterology | Admitting: Gastroenterology

## 2023-03-24 ENCOUNTER — Encounter
Admission: RE | Disposition: A | Discharge status: Home / Self Care | Payer: Self-pay | Source: Home / Self Care | Attending: Gastroenterology

## 2023-03-24 DIAGNOSIS — M1990 Unspecified osteoarthritis, unspecified site: Secondary | ICD-10-CM | POA: Diagnosis not present

## 2023-03-24 DIAGNOSIS — Z1211 Encounter for screening for malignant neoplasm of colon: Secondary | ICD-10-CM

## 2023-03-24 DIAGNOSIS — E039 Hypothyroidism, unspecified: Secondary | ICD-10-CM | POA: Diagnosis not present

## 2023-03-24 DIAGNOSIS — Z6836 Body mass index (BMI) 36.0-36.9, adult: Secondary | ICD-10-CM | POA: Insufficient documentation

## 2023-03-24 DIAGNOSIS — E669 Obesity, unspecified: Secondary | ICD-10-CM | POA: Diagnosis not present

## 2023-03-24 DIAGNOSIS — K449 Diaphragmatic hernia without obstruction or gangrene: Secondary | ICD-10-CM | POA: Diagnosis not present

## 2023-03-24 DIAGNOSIS — M810 Age-related osteoporosis without current pathological fracture: Secondary | ICD-10-CM | POA: Insufficient documentation

## 2023-03-24 DIAGNOSIS — E7800 Pure hypercholesterolemia, unspecified: Secondary | ICD-10-CM | POA: Diagnosis not present

## 2023-03-24 DIAGNOSIS — K3189 Other diseases of stomach and duodenum: Secondary | ICD-10-CM | POA: Diagnosis not present

## 2023-03-24 DIAGNOSIS — K279 Peptic ulcer, site unspecified, unspecified as acute or chronic, without hemorrhage or perforation: Secondary | ICD-10-CM

## 2023-03-24 DIAGNOSIS — Z8711 Personal history of peptic ulcer disease: Secondary | ICD-10-CM | POA: Diagnosis not present

## 2023-03-24 HISTORY — PX: ESOPHAGOGASTRODUODENOSCOPY (EGD) WITH PROPOFOL: SHX5813

## 2023-03-24 HISTORY — DX: Peptic ulcer, site unspecified, unspecified as acute or chronic, without hemorrhage or perforation: K27.9

## 2023-03-24 SURGERY — ESOPHAGOGASTRODUODENOSCOPY (EGD) WITH PROPOFOL
Anesthesia: General

## 2023-03-24 MED ORDER — PROPOFOL 10 MG/ML IV BOLUS
INTRAVENOUS | Status: DC | PRN
  Administered 2023-03-24: 100 mg via INTRAVENOUS

## 2023-03-24 MED ORDER — PROPOFOL 500 MG/50ML IV EMUL
INTRAVENOUS | Status: DC | PRN
  Administered 2023-03-24: 186.884 ug/kg/min via INTRAVENOUS

## 2023-03-24 MED ORDER — DEXMEDETOMIDINE HCL 200 MCG/2ML IV SOLN
INTRAVENOUS | Status: DC | PRN
  Administered 2023-03-24: 8 ug via INTRAVENOUS

## 2023-03-24 MED ORDER — LIDOCAINE HCL (CARDIAC) PF 100 MG/5ML IV SOSY
PREFILLED_SYRINGE | INTRAVENOUS | Status: DC | PRN
  Administered 2023-03-24: 100 mg via INTRAVENOUS

## 2023-03-24 MED ORDER — DEXMEDETOMIDINE HCL IN NACL 80 MCG/20ML IV SOLN
INTRAVENOUS | Status: AC
  Filled 2023-03-24: qty 20

## 2023-03-24 MED ORDER — PROPOFOL 1000 MG/100ML IV EMUL
INTRAVENOUS | Status: AC
  Filled 2023-03-24: qty 200

## 2023-03-24 MED ORDER — SODIUM CHLORIDE 0.9 % IV SOLN
INTRAVENOUS | Status: DC
  Administered 2023-03-24: 20 mL/h via INTRAVENOUS

## 2023-03-24 MED ORDER — OMEPRAZOLE 40 MG PO CPDR: 40.0000 mg | DELAYED_RELEASE_CAPSULE | Freq: Every day | ORAL | 0 refills | Status: DC

## 2023-03-24 MED ORDER — LIDOCAINE HCL (PF) 2 % IJ SOLN
INTRAMUSCULAR | Status: AC
  Filled 2023-03-24: qty 25

## 2023-03-24 MED ORDER — PROPOFOL 1000 MG/100ML IV EMUL
INTRAVENOUS | Status: AC
  Filled 2023-03-24: qty 100

## 2023-03-24 NOTE — Anesthesia Postprocedure Evaluation (Signed)
Anesthesia Post Note  Patient: Jamie Koch  Procedure(s) Performed: ESOPHAGOGASTRODUODENOSCOPY (EGD) WITH PROPOFO  Patient location during evaluation: Endoscopy Anesthesia Type: General Level of consciousness: awake and alert Pain management: pain level controlled Vital Signs Assessment: post-procedure vital signs reviewed and stable Respiratory status: spontaneous breathing, nonlabored ventilation and respiratory function stable Cardiovascular status: blood pressure returned to baseline and stable Postop Assessment: no apparent nausea or vomiting Anesthetic complications: no   No notable events documented.   Last Vitals:  Vitals:   03/24/23 0815 03/24/23 0825  BP:  133/83  Pulse:    Resp: 17   Temp:    SpO2:      Last Pain:  Vitals:   03/24/23 0825  TempSrc:   PainSc: 0-No pain                 Foye Deer

## 2023-03-24 NOTE — H&P (Signed)
Jamie Repress, MD [address]  [address]  [place], [place] [address]  Main: [phone]  Fax: [phone] Pager: [phone]  Primary Care Physician:  Doreene Nest, NP Primary Gastroenterologist:  Dr. Arlyss Koch  Pre-Procedure History & Physical: HPI:  Jamie Koch is a 67 y.o. female is here for an endoscopy   Past Medical History:  Diagnosis Date   Arthritis    Chicken pox    Depression    Eye irritation 07/17/2020   Hay fever    High cholesterol    Hypothyroidism    Osteoporosis    PMB (postmenopausal bleeding) 07/17/2020   Thyroid dysfunction     Past Surgical History:  Procedure Laterality Date   COLONOSCOPY WITH PROPOFOL N/A 12/24/2022   Procedure: COLONOSCOPY WITH PROPOFOL;  Surgeon: Wyline Mood, MD;  Location: [facility] ENDOSCOPY;  Service: Endoscopy;  Laterality: N/A;   DILATATION & CURETTAGE/HYSTEROSCOPY WITH MYOSURE N/A 07/29/2020   Procedure: FRACTIONAL DILATATION & CURETTAGE/HYSTEROSCOPY WITH MYOSURE RESECTION OF ENDOMETRIAL POLYP;  Surgeon: Schermerhorn, Ihor Austin, MD;  Location: [facility] ORS;  Service: Gynecology;  Laterality: N/A;   ESOPHAGOGASTRODUODENOSCOPY (EGD) WITH PROPOFOL N/A 12/24/2022   Procedure: ESOPHAGOGASTRODUODENOSCOPY (EGD) WITH PROPOFOL;  Surgeon: Wyline Mood, MD;  Location: [facility] ENDOSCOPY;  Service: Endoscopy;  Laterality: N/A;   LAPAROSCOPIC TOTAL HYSTERECTOMY  07/02/2022   PARTIAL KNEE ARTHROPLASTY Left    TONSILLECTOMY AND ADENOIDECTOMY  1964   TOTAL KNEE ARTHROPLASTY Right     Prior to Admission medications   Medication Sig Start Date End Date Taking? Authorizing Provider  Acetaminophen (TYLENOL ARTHRITIS PAIN PO) Take 2 tablets by mouth.   Yes [provider]  ARMOUR THYROID 15 MG tablet Take 1 tablet by mouth daily. 11/03/22  Yes [provider]  diphenhydrAMINE (BENADRYL) 25 MG tablet Take 25 mg by mouth at bedtime as needed for allergies or sleep.   Yes [provider]  Ferrous Sulfate Dried ([facility]  SLOW RELEASE IRON) 45 MG TBCR Take by mouth.   Yes [provider]  gabapentin (NEURONTIN) 300 MG capsule Take 1 capsule (300 mg total) by mouth at bedtime. For pain. 10/19/22  Yes Doreene Nest, NP  omeprazole (PRILOSEC) 40 MG capsule Take 1 capsule (40 mg total) by mouth 2 (two) times daily. 12/25/22 03/25/23 Yes Pahwani, Daleen Bo, MD  OVER THE COUNTER MEDICATION Place 2 sprays into both nostrils daily as needed (allergies). Sovereign Silver nasal spray   Yes [provider]  PRESCRIPTION MEDICATION Testosterone 100 mg pellets placed under the skin every 3 months   Yes [provider]  PRESCRIPTION MEDICATION Estradiol 6 mg pellets placed under the skin every 3 months   Yes [provider]  progesterone (PROMETRIUM) 100 MG capsule Take 100 mg by mouth every evening.   Yes [provider]  simvastatin (ZOCOR) 40 MG tablet TAKE 1/2 TABLET BY MOUTH DAILY FOR CHOLESTEROL 12/27/22  Yes Doreene Nest, NP  thyroid (ARMOUR) 60 MG tablet Take 60 mg by mouth daily before breakfast.   Yes [provider]  triamcinolone cream (KENALOG) 0.1 % Apply 1 Application topically daily. 12/07/22  Yes [provider]  Vitamin D-Vitamin K (VITAMIN K2-VITAMIN D3 PO) Take 3 tablets by mouth daily.   Yes [provider]    Allergies as of 02/22/2023 - Review Complete 02/22/2023  Allergen Reaction Noted   Covid-19 (mrna) vaccine Other (See Comments) 07/16/2020   Lactose intolerance (gi)  07/16/2020   Medroxyprogesterone Itching 07/16/2020    History reviewed. No pertinent  family history.  Social History   Socioeconomic History   Marital status: Married    Spouse name: Not on file   Number of children: Not on file   Years of education: Not on file   Highest education level: Not on file  Occupational History   Not on file  Tobacco Use   Smoking status: Never   Smokeless tobacco: Never  Vaping Use   Vaping Use: Never used  Substance  and Sexual Activity   Alcohol use: No   Drug use: No   Sexual activity: Yes  Other Topics Concern   Not on file  Social History Narrative   Married.   1 child.   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    Enjoys reading, gardening.    Social Determinants of Health   Financial Resource Strain: Low Risk  (02/03/2022)   Overall Financial Resource Strain (CARDIA)    Difficulty of Paying Living Expenses: Not hard at all  Food Insecurity: No Food Insecurity (12/28/2022)   Hunger Vital Sign    Worried About Running Out of Food in the Last Year: Never true    Ran Out of Food in the Last Year: Never true  Transportation Needs: No Transportation Needs (12/28/2022)   PRAPARE - Administrator, Civil Service (Medical): No    Lack of Transportation (Non-Medical): No  Physical Activity: Inactive (02/03/2022)   Exercise Vital Sign    Days of Exercise per Week: 0 days    Minutes of Exercise per Session: 0 min  Stress: No Stress Concern Present (02/03/2022)   [employer] of Occupational Health - Occupational Stress Questionnaire    Feeling of Stress : Not at all  Social Connections: Moderately Integrated (02/03/2022)   Social Connection and Isolation Panel [NHANES]    Frequency of Communication with Friends and Family: More than three times a week    Frequency of Social Gatherings with Friends and Family: Never    Attends Religious Services: More than 4 times per year    Active Member of [employer] or Organizations: No    Attends Banker Meetings: Never    Marital Status: Married  Catering manager Violence: Not At Risk (12/23/2022)   Humiliation, Afraid, Rape, and Kick questionnaire    Fear of Current or Ex-Partner: No    Emotionally Abused: No    Physically Abused: No    Sexually Abused: No    Review of Systems: See HPI, otherwise negative ROS  Physical Exam: BP (!) 177/107   Pulse (!) 106   Temp 97.7 F (36.5 C) (Temporal)   Resp 18   Ht 5' 4.5" (1.638 m)    Wt 98.1 kg   SpO2 96%   BMI 36.54 kg/m  General:   Alert,  pleasant and cooperative in NAD Head:  Normocephalic and atraumatic. Neck:  Supple; no masses or thyromegaly. Lungs:  Clear throughout to auscultation.    Heart:  Regular rate and rhythm. Abdomen:  Soft, nontender and nondistended. Normal bowel sounds, without guarding, and without rebound.   Neurologic:  Alert and  oriented x4;  grossly normal neurologically.  Impression/Plan: Jullissa Rothweiler is here for an endoscopy to be performed for follow up of peptic ulcers  Risks, benefits, limitations, and alternatives regarding  endoscopy have been reviewed with the patient.  Questions have been answered.  All parties agreeable.   Lannette Donath, MD  03/24/2023, 7:47 AM

## 2023-03-24 NOTE — Transfer of Care (Signed)
Immediate Anesthesia Transfer of Care Note  Patient: Cale Moraitis  Procedure(s) Performed: ESOPHAGOGASTRODUODENOSCOPY (EGD) WITH PROPOFO  Patient Location: Endoscopy Unit  Anesthesia Type:General  Level of Consciousness: drowsy and patient cooperative  Airway & Oxygen Therapy: Patient Spontanous Breathing  Post-op Assessment: Report given to RN and Post -op Vital signs reviewed and stable  Post vital signs: Reviewed and stable  Last Vitals:  Vitals Value Taken Time  BP    Temp    Pulse 101 03/24/23 0805  Resp 28 03/24/23 0805  SpO2 96 % 03/24/23 0805  Vitals shown include unvalidated device data.  Last Pain:  Vitals:   03/24/23 0654  TempSrc: Temporal  PainSc: 0-No pain         Complications: No notable events documented.

## 2023-03-24 NOTE — Op Note (Signed)
[facility] Gastroenterology Patient Name: Jamie Koch Procedure Date: 03/24/2023 7:12 AM MRN: [id number] Account #: [id number] Date of Birth: 1956-11-01 Admit Type: Outpatient Age: 67 Room: [facility] ENDO ROOM 4 Gender: Female Note Status: Finalized Instrument Name: Upper Endoscope [id number] Procedure:             Upper GI endoscopy Indications:           Follow-up of gastric ulcer Providers:             Toney Reil MD, MD Referring MD:          Doreene Nest (Referring MD) Medicines:             General Anesthesia Complications:         No immediate complications. Estimated blood loss: None. Procedure:             Pre-Anesthesia Assessment:                        - Prior to the procedure, a History and Physical was                         performed, and patient medications and allergies were                         reviewed. The patient is competent. The risks and                         benefits of the procedure and the sedation options and                         risks were discussed with the patient. All questions                         were answered and informed consent was obtained.                         Patient identification and proposed procedure were                         verified by the physician, the nurse, the                         anesthesiologist, the anesthetist and the technician                         in the pre-procedure area in the procedure room in the                         endoscopy suite. Mental Status Examination: alert and                         oriented. Airway Examination: normal oropharyngeal                         airway and neck mobility. Respiratory Examination:                         clear to auscultation. CV Examination: normal.  Prophylactic Antibiotics: The patient does not require                         prophylactic antibiotics. Prior Anticoagulants: The                          patient has taken no anticoagulant or antiplatelet                         agents. ASA Grade Assessment: II - A patient with mild                         systemic disease. After reviewing the risks and                         benefits, the patient was deemed in satisfactory                         condition to undergo the procedure. The anesthesia                         plan was to use general anesthesia. Immediately prior                         to administration of medications, the patient was                         re-assessed for adequacy to receive sedatives. The                         heart rate, respiratory rate, oxygen saturations,                         blood pressure, adequacy of pulmonary ventilation, and                         response to care were monitored throughout the                         procedure. The physical status of the patient was                         re-assessed after the procedure.                        After obtaining informed consent, the endoscope was                         passed under direct vision. Throughout the procedure,                         the patient's blood pressure, pulse, and oxygen                         saturations were monitored continuously. The Endoscope                         was introduced through the mouth, and advanced to the  second part of duodenum. The upper GI endoscopy was                         accomplished without difficulty. The patient tolerated                         the procedure well. Findings:      The duodenal bulb and second portion of the duodenum were normal.      Striped mildly erythematous mucosa without bleeding was found in the       gastric antrum and in the prepyloric region of the stomach.      A small hiatal hernia was present.      The gastric body and incisura were normal.      The cardia and gastric fundus were normal on retroflexion.      The gastroesophageal junction  and examined esophagus were normal. Impression:            - Normal duodenal bulb and second portion of the                         duodenum.                        - Erythematous mucosa in the antrum and prepyloric                         region of the stomach.                        - Small hiatal hernia.                        - Normal gastric body and incisura.                        - Normal gastroesophageal junction and esophagus.                        - No specimens collected. Recommendation:        - Discharge patient to home (with escort).                        - Resume previous diet today.                        - Continue present medications.                        - Follow an antireflux regimen indefinitely.                        - Use Prilosec (omeprazole) 40 mg PO daily                         indefinitely. Procedure Code(s):     --- Professional ---                        [phone], Esophagogastroduodenoscopy, flexible,                         transoral; diagnostic, including collection  of                         specimen(s) by brushing or washing, when performed                         (separate procedure) Diagnosis Code(s):     --- Professional ---                        K31.89, Other diseases of stomach and duodenum                        K44.9, Diaphragmatic hernia without obstruction or                         gangrene                        K25.9, Gastric ulcer, unspecified as acute or chronic,                         without hemorrhage or perforation CPT copyright 2022 American Medical Association. All rights reserved. The codes documented in this report are preliminary and upon coder review may  be revised to meet current compliance requirements. Dr. Libby Maw Toney Reil MD, MD 03/24/2023 8:03:48 AM This report has been signed electronically. Number of Addenda: 0 Note Initiated On: 03/24/2023 7:12 AM Estimated Blood Loss:  Estimated blood loss:  none.      [facility]

## 2023-03-25 ENCOUNTER — Encounter: Payer: Self-pay | Admitting: Gastroenterology

## 2023-05-24 ENCOUNTER — Telehealth: Payer: Self-pay

## 2023-05-24 MED ORDER — OMEPRAZOLE 40 MG PO CPDR: 40.0000 mg | DELAYED_RELEASE_CAPSULE | Freq: Every day | ORAL | 3 refills | Status: AC

## 2023-05-24 NOTE — Telephone Encounter (Signed)
Received a refill request by fax from [facility]

## 2023-05-25 ENCOUNTER — Telehealth: Payer: Self-pay

## 2023-05-25 DIAGNOSIS — D500 Iron deficiency anemia secondary to blood loss (chronic): Secondary | ICD-10-CM

## 2023-05-25 NOTE — Telephone Encounter (Signed)
Patient verbalized understanding and states she will have lab work done this week

## 2023-05-25 NOTE — Telephone Encounter (Signed)
-----   Message from [facility] sent at 02/23/2023 11:32 AM EDT ----- echeck iron levels in 3 months

## 2023-05-27 LAB — IRON,TIBC AND FERRITIN PANEL
Ferritin: 15 ng/mL (ref 15–150)
Iron Saturation: 19 % (ref 15–55)
Iron: 71 ug/dL (ref 27–139)
Total Iron Binding Capacity: 383 ug/dL (ref 250–450)
UIBC: 312 ug/dL (ref 118–369)

## 2023-05-31 ENCOUNTER — Telehealth: Payer: Self-pay

## 2023-05-31 NOTE — Telephone Encounter (Signed)
-----   Message from [facility] sent at 05/30/2023  4:48 PM EDT ----- Please inform patient that her iron studies are looking better, I recommend to continue taking oral iron Recheck CBC, folic acid, B12 levels and iron panel in 3 months  RV

## 2023-05-31 NOTE — Telephone Encounter (Signed)
Called and informed patient of results and she verbalized understanding and put in a recall for 3 months labs

## 2023-07-14 ENCOUNTER — Telehealth: Payer: Medicare Other | Admitting: Physician Assistant

## 2023-07-14 DIAGNOSIS — U071 COVID-19: Secondary | ICD-10-CM

## 2023-07-14 MED ORDER — BENZONATATE 100 MG PO CAPS
100.0000 mg | ORAL_CAPSULE | Freq: Three times a day (TID) | ORAL | 0 refills | Status: DC | PRN
Start: 2023-07-14 — End: 2023-08-18

## 2023-07-14 MED ORDER — NIRMATRELVIR/RITONAVIR (PAXLOVID)TABLET
3.0000 | ORAL_TABLET | Freq: Two times a day (BID) | ORAL | 0 refills | Status: AC
Start: 2023-07-14 — End: 2023-07-19

## 2023-07-14 NOTE — Patient Instructions (Signed)
Jamie Koch, thank you for joining Piedad Climes, PA-C for today's virtual visit.  While this provider is not your primary care provider (PCP), if your PCP is located in our provider database this encounter information will be shared with them immediately following your visit.   A [facility] MyChart account gives you access to today's visit and all your visits, tests, and labs performed at [facility] " click here if you don't have a [facility] MyChart account or go to [email or web address]  Consent: (Patient) Jamie Koch provided verbal consent for this virtual visit at the beginning of the encounter.  Current Medications:  Current Outpatient Medications:    Acetaminophen (TYLENOL ARTHRITIS PAIN PO), Take 2 tablets by mouth., Disp: , Rfl:    ARMOUR THYROID 15 MG tablet, Take 1 tablet by mouth daily., Disp: , Rfl:    diphenhydrAMINE (BENADRYL) 25 MG tablet, Take 25 mg by mouth at bedtime as needed for allergies or sleep., Disp: , Rfl:    Ferrous Sulfate Dried ([employer] SLOW RELEASE IRON) 45 MG TBCR, Take by mouth., Disp: , Rfl:    gabapentin (NEURONTIN) 300 MG capsule, Take 1 capsule (300 mg total) by mouth at bedtime. For pain., Disp: 90 capsule, Rfl: 3   omeprazole (PRILOSEC) 40 MG capsule, Take 1 capsule (40 mg total) by mouth daily before breakfast., Disp: 90 capsule, Rfl: 3   OVER THE COUNTER MEDICATION, Place 2 sprays into both nostrils daily as needed (allergies). Sovereign Silver nasal spray, Disp: , Rfl:    PRESCRIPTION MEDICATION, Testosterone 100 mg pellets placed under the skin every 3 months, Disp: , Rfl:    PRESCRIPTION MEDICATION, Estradiol 6 mg pellets placed under the skin every 3 months, Disp: , Rfl:    progesterone (PROMETRIUM) 100 MG capsule, Take 100 mg by mouth every evening., Disp: , Rfl:    simvastatin (ZOCOR) 40 MG tablet, TAKE 1/2 TABLET BY MOUTH DAILY FOR CHOLESTEROL, Disp: 45 tablet, Rfl: 2   thyroid (ARMOUR) 60 MG tablet, Take 60 mg by  mouth daily before breakfast., Disp: , Rfl:    triamcinolone cream (KENALOG) 0.1 %, Apply 1 Application topically daily., Disp: , Rfl:    Vitamin D-Vitamin K (VITAMIN K2-VITAMIN D3 PO), Take 3 tablets by mouth daily., Disp: , Rfl:    Medications ordered in this encounter:  No orders of the defined types were placed in this encounter.    *If you need refills on other medications prior to your next appointment, please contact your pharmacy*  Follow-Up: Call back or seek an in-person evaluation if the symptoms worsen or if the condition fails to improve as anticipated.   Virtual Care [phone]  Care Instructions: Please keep well-hydrated and get plenty of rest. Start a saline nasal rinse to flush out your nasal passages. You can use plain Mucinex to help thin congestion. Make sure to stop your Simvastatin while taking the Paxlovid. You can restart it in 10 days. If you have a humidifier, running in the bedroom at night. I want you to start OTC vitamin D3 1000 units daily, vitamin C 1000 mg daily, and a zinc supplement. Please take prescribed medications as directed.    Isolation Instructions: You are to isolate at home until you have been fever free for at least 24 hours without a fever-reducing medication, and symptoms have been steadily improving for 24 hours. At that time,  you can end isolation but need to mask for an additional 5 days.   If you must be  around other household members who do not have symptoms, you need to make sure that both you and the family members are masking consistently with a high-quality mask.  If you note any worsening of symptoms despite treatment, please seek an in-person evaluation ASAP. If you note any significant shortness of breath or any chest pain, please seek ER evaluation. Please do not delay care!   COVID-19: What to Do if You Are Sick If you test positive and are an older adult or someone who is at high risk of getting very  sick from COVID-19, treatment may be available. Contact a healthcare provider right away after a positive test to determine if you are eligible, even if your symptoms are mild right now. You can also visit a Test to Treat location and, if eligible, receive a prescription from a provider. Don't delay: Treatment must be started within the first few days to be effective. If you have a fever, cough, or other symptoms, you might have COVID-19. Most people have mild illness and are able to recover at home. If you are sick: Keep track of your symptoms. If you have an emergency warning sign (including trouble breathing), call 911. Steps to help prevent the spread of COVID-19 if you are sick If you are sick with COVID-19 or think you might have COVID-19, follow the steps below to care for yourself and to help protect other people in your home and community. Stay home except to get medical care Stay home. Most people with COVID-19 have mild illness and can recover at home without medical care. Do not leave your home, except to get medical care. Do not visit public areas and do not go to places where you are unable to wear a mask. Take care of yourself. Get rest and stay hydrated. Take over-the-counter medicines, such as acetaminophen, to help you feel better. Stay in touch with your doctor. Call before you get medical care. Be sure to get care if you have trouble breathing, or have any other emergency warning signs, or if you think it is an emergency. Avoid public transportation, ride-sharing, or taxis if possible. Get tested If you have symptoms of COVID-19, get tested. While waiting for test results, stay away from others, including staying apart from those living in your household. Get tested as soon as possible after your symptoms start. Treatments may be available for people with COVID-19 who are at risk for becoming very sick. Don't delay: Treatment must be started early to be effective--some treatments  must begin within 5 days of your first symptoms. Contact your healthcare provider right away if your test result is positive to determine if you are eligible. Self-tests are one of several options for testing for the virus that causes COVID-19 and may be more convenient than laboratory-based tests and point-of-care tests. Ask your healthcare provider or your local health department if you need help interpreting your test results. You can visit your state, tribal, local, and territorial health department's website to look for the latest local information on testing sites. Separate yourself from other people As much as possible, stay in a specific room and away from other people and pets in your home. If possible, you should use a separate bathroom. If you need to be around other people or animals in or outside of the home, wear a well-fitting mask. Tell your close contacts that they may have been exposed to COVID-19. An infected person can spread COVID-19 starting 48 hours (or 2 days) before the  person has any symptoms or tests positive. By letting your close contacts know they may have been exposed to COVID-19, you are helping to protect everyone. See COVID-19 and Animals if you have questions about pets. If you are diagnosed with COVID-19, someone from the health department may call you. Answer the call to slow the spread. Monitor your symptoms Symptoms of COVID-19 include fever, cough, or other symptoms. Follow care instructions from your healthcare provider and local health department. Your local health authorities may give instructions on checking your symptoms and reporting information. When to seek emergency medical attention Look for emergency warning signs* for COVID-19. If someone is showing any of these signs, seek emergency medical care immediately: Trouble breathing Persistent pain or pressure in the chest New confusion Inability to wake or stay awake Pale, gray, or blue-colored skin,  lips, or nail beds, depending on skin tone *This list is not all possible symptoms. Please call your medical provider for any other symptoms that are severe or concerning to you. Call 911 or call ahead to your local emergency facility: Notify the operator that you are seeking care for someone who has or may have COVID-19. Call ahead before visiting your doctor Call ahead. Many medical visits for routine care are being postponed or done by phone or telemedicine. If you have a medical appointment that cannot be postponed, call your doctor's office, and tell them you have or may have COVID-19. This will help the office protect themselves and other patients. If you are sick, wear a well-fitting mask You should wear a mask if you must be around other people or animals, including pets (even at home). Wear a mask with the best fit, protection, and comfort for you. You don't need to wear the mask if you are alone. If you can't put on a mask (because of trouble breathing, for example), cover your coughs and sneezes in some other way. Try to stay at least 6 feet away from other people. This will help protect the people around you. Masks should not be placed on young children under age 41 years, anyone who has trouble breathing, or anyone who is not able to remove the mask without help. Cover your coughs and sneezes Cover your mouth and nose with a tissue when you cough or sneeze. Throw away used tissues in a lined trash can. Immediately wash your hands with soap and water for at least 20 seconds. If soap and water are not available, clean your hands with an alcohol-based hand sanitizer that contains at least 60% alcohol. Clean your hands often Wash your hands often with soap and water for at least 20 seconds. This is especially important after blowing your nose, coughing, or sneezing; going to the bathroom; and before eating or preparing food. Use hand sanitizer if soap and water are not available. Use an  alcohol-based hand sanitizer with at least 60% alcohol, covering all surfaces of your hands and rubbing them together until they feel dry. Soap and water are the best option, especially if hands are visibly dirty. Avoid touching your eyes, nose, and mouth with unwashed hands. Handwashing Tips Avoid sharing personal household items Do not share dishes, drinking glasses, cups, eating utensils, towels, or bedding with other people in your home. Wash these items thoroughly after using them with soap and water or put in the dishwasher. Clean surfaces in your home regularly Clean and disinfect high-touch surfaces (for example, doorknobs, tables, handles, light switches, and countertops) in your "sick room" and bathroom.  In shared spaces, you should clean and disinfect surfaces and items after each use by the person who is ill. If you are sick and cannot clean, a caregiver or other person should only clean and disinfect the area around you (such as your bedroom and bathroom) on an as needed basis. Your caregiver/other person should wait as long as possible (at least several hours) and wear a mask before entering, cleaning, and disinfecting shared spaces that you use. Clean and disinfect areas that may have blood, stool, or body fluids on them. Use household cleaners and disinfectants. Clean visible dirty surfaces with household cleaners containing soap or detergent. Then, use a household disinfectant. Use a product from [employer] List N: Disinfectants for Coronavirus (COVID-19). Be sure to follow the instructions on the label to ensure safe and effective use of the product. Many products recommend keeping the surface wet with a disinfectant for a certain period of time (look at "contact time" on the product label). You may also need to wear personal protective equipment, such as gloves, depending on the directions on the product label. Immediately after disinfecting, wash your hands with soap and water for 20  seconds. For completed guidance on cleaning and disinfecting your home, visit Complete Disinfection Guidance. Take steps to improve ventilation at home Improve ventilation (air flow) at home to help prevent from spreading COVID-19 to other people in your household. Clear out COVID-19 virus particles in the air by opening windows, using air filters, and turning on fans in your home. Use this interactive tool to learn how to improve air flow in your home. When you can be around others after being sick with COVID-19 Deciding when you can be around others is different for different situations. Find out when you can safely end home isolation. For any additional questions about your care, contact your healthcare provider or state or local health department. 01/28/2021 Content source: [facility] for Immunization and Respiratory Diseases ([facility]), Division of Viral Diseases This information is not intended to replace advice given to you by your health care provider. Make sure you discuss any questions you have with your health care provider. Document Revised: 03/13/2021 Document Reviewed: 03/13/2021 Elsevier Patient Education  2022 [employer].     If you have been instructed to have an in-person evaluation today at a local Urgent Care facility, please use the link below. It will take you to a list of all of our available [facility], including address, phone number and hours of operation. Please do not delay care.  [facility]  If you or a family member do not have a primary care provider, use the link below to schedule a visit and establish care. When you choose a [facility] primary care physician or advanced practice provider, you gain a long-term partner in health. Find a Primary Care Provider  Learn more about [facility]'s in-office and virtual care options: [facility] - Get Care Now

## 2023-07-14 NOTE — Progress Notes (Signed)
   Thank you for the details you included in the comment boxes. Those details are very helpful in determining the best course of treatment for you and help [place] to provide the best care. Because you are COVID positive and have risk factors for more significant infection where antivirals should be considered/discussed,, we recommend that you convert this visit to a video visit in order for the provider to better assess what is going on.  The provider will be able to give you a more accurate diagnosis and treatment plan if we can more freely discuss your symptoms and with the addition of a virtual examination.   If you convert to a video visit, we will bill your insurance (similar to an office visit) and you will not be charged for this e-Visit. You will be able to stay at home and speak with the first available [facility] advanced practice provider. The link to do a video visit is in the drop down Menu tab of your Welcome screen in MyChart.

## 2023-07-14 NOTE — Progress Notes (Signed)
Virtual Visit Consent   [name], you are scheduled for a virtual visit with a [facility] provider today. Just as with appointments in the office, your consent must be obtained to participate. Your consent will be active for this visit and any virtual visit you may have with one of our providers in the next 365 days. If you have a MyChart account, a copy of this consent can be sent to you electronically.  As this is a virtual visit, video technology does not allow for your provider to perform a traditional examination. This may limit your provider's ability to fully assess your condition. If your provider identifies any concerns that need to be evaluated in person or the need to arrange testing (such as labs, EKG, etc.), we will make arrangements to do so. Although advances in technology are sophisticated, we cannot ensure that it will always work on either your end or our end. If the connection with a video visit is poor, the visit may have to be switched to a telephone visit. With either a video or telephone visit, we are not always able to ensure that we have a secure connection.  By engaging in this virtual visit, you consent to the provision of healthcare and authorize for your insurance to be billed (if applicable) for the services provided during this visit. Depending on your insurance coverage, you may receive a charge related to this service.  I need to obtain your verbal consent now. Are you willing to proceed with your visit today? Anya Wieneke has provided verbal consent on 07/14/2023 for a virtual visit (video or telephone). Piedad Climes, [place]  Date: 07/14/2023 11:35 AM  Virtual Visit via Video Note   I, Piedad Climes, connected with  Jamie Koch  ([id number], 1956/11/03) on 07/14/23 at 11:30 AM EDT by a video-enabled telemedicine application and verified that I am speaking with the correct person using two identifiers.  Location: Patient: Virtual Visit Location  Patient: Home Provider: Virtual Visit Location Provider: Home Office   I discussed the limitations of evaluation and management by telemedicine and the availability of in person appointments. The patient expressed understanding and agreed to proceed.    History of Present Illness: Jamie Koch is a 67 y.o. who identifies as a female who was assigned female at birth, and is being seen today for COVID-19. Endorses symptoms starting yesterday with fever (Tmax 101.1), cough, aches, nasal congestion and headache. Today with some loose stool. Denies chest pain, SOB, nausea or vomiting. Denies blood in stool. Tested positive on at home test. Husband with pulmonary fibrosis but thankfully asymptomatic at present.   HPI: HPI  Problems:  Patient Active Problem List   Diagnosis Date Noted   PUD (peptic ulcer disease) 03/24/2023   Diverticulosis of intestine with bleeding 12/25/2022   Gastric ulcer 12/24/2022   GERD without esophagitis 12/23/2022   Dyslipidemia 12/23/2022   Peripheral neuropathy 12/23/2022   Anemia due to acute blood loss 12/23/2022   NSAID long-term use 12/23/2022   GI bleeding 12/22/2022   Acute neck pain 10/15/2021   Osteopenia 10/15/2021   Welcome to Medicare preventive visit 09/25/2020   Prediabetes 07/11/2019   Closed fracture of phalanx of foot 08/05/2017   Right foot injury, initial encounter 05/31/2017   Preventative health care 10/19/2016   Iron deficiency anemia 04/17/2016   Hypothyroidism 04/17/2016   Hyperlipidemia 04/17/2016   Osteoarthritis 04/17/2016   Fracture of head of radius 04/14/2016    Allergies:  Allergies  Allergen Reactions  Covid-19 (Mrna) Vaccine Other (See Comments)    Pfizer covid-19 vaccine. Caused left arm tingling, headaches, burning skin, vaginal bleeding.   Lactose Intolerance (Gi)     Upset stomach    Medroxyprogesterone Itching   Medications:  Current Outpatient Medications:    benzonatate (TESSALON) 100 MG capsule, Take 1  capsule (100 mg total) by mouth 3 (three) times daily as needed for cough., Disp: 30 capsule, Rfl: 0   nirmatrelvir/ritonavir (PAXLOVID) 20 x 150 MG & 10 x 100MG  TABS, Take 3 tablets by mouth 2 (two) times daily for 5 days. (Take nirmatrelvir 150 mg two tablets twice daily for 5 days and ritonavir 100 mg one tablet twice daily for 5 days) Patient GFR is 75, Disp: 30 tablet, Rfl: 0   Acetaminophen (TYLENOL ARTHRITIS PAIN PO), Take 2 tablets by mouth., Disp: , Rfl:    ARMOUR THYROID 15 MG tablet, Take 1 tablet by mouth daily., Disp: , Rfl:    diphenhydrAMINE (BENADRYL) 25 MG tablet, Take 25 mg by mouth at bedtime as needed for allergies or sleep., Disp: , Rfl:    Ferrous Sulfate Dried ([employer] SLOW RELEASE IRON) 45 MG TBCR, Take by mouth., Disp: , Rfl:    gabapentin (NEURONTIN) 300 MG capsule, Take 1 capsule (300 mg total) by mouth at bedtime. For pain., Disp: 90 capsule, Rfl: 3   omeprazole (PRILOSEC) 40 MG capsule, Take 1 capsule (40 mg total) by mouth daily before breakfast., Disp: 90 capsule, Rfl: 3   OVER THE COUNTER MEDICATION, Place 2 sprays into both nostrils daily as needed (allergies). Sovereign Silver nasal spray, Disp: , Rfl:    PRESCRIPTION MEDICATION, Testosterone 100 mg pellets placed under the skin every 3 months, Disp: , Rfl:    PRESCRIPTION MEDICATION, Estradiol 6 mg pellets placed under the skin every 3 months, Disp: , Rfl:    progesterone (PROMETRIUM) 100 MG capsule, Take 100 mg by mouth every evening., Disp: , Rfl:    simvastatin (ZOCOR) 40 MG tablet, TAKE 1/2 TABLET BY MOUTH DAILY FOR CHOLESTEROL, Disp: 45 tablet, Rfl: 2   thyroid (ARMOUR) 60 MG tablet, Take 60 mg by mouth daily before breakfast., Disp: , Rfl:    triamcinolone cream (KENALOG) 0.1 %, Apply 1 Application topically daily., Disp: , Rfl:    Vitamin D-Vitamin K (VITAMIN K2-VITAMIN D3 PO), Take 3 tablets by mouth daily., Disp: , Rfl:   Observations/Objective: Patient is well-developed, well-nourished in no acute distress.   Resting comfortably at home.  Head is normocephalic, atraumatic.  No labored breathing. Speech is clear and coherent with logical content.  Patient is alert and oriented at baseline.   Assessment and Plan: 1. COVID-19 - benzonatate (TESSALON) 100 MG capsule; Take 1 capsule (100 mg total) by mouth 3 (three) times daily as needed for cough.  Dispense: 30 capsule; Refill: 0 - nirmatrelvir/ritonavir (PAXLOVID) 20 x 150 MG & 10 x 100MG  TABS; Take 3 tablets by mouth 2 (two) times daily for 5 days. (Take nirmatrelvir 150 mg two tablets twice daily for 5 days and ritonavir 100 mg one tablet twice daily for 5 days) Patient GFR is 75  Dispense: 30 tablet; Refill: 0  Patient with multiple risk factors for complicated course of illness. Discussed risks/benefits of antiviral medications including most common potential ADRs. Patient voiced understanding and would like to proceed with antiviral medication. They are candidate for Paxlovid. Instructed to hold off Simvastatin while on paxlovid and for 5 additional days. Rx sent to pharmacy. Supportive measures, OTC medications and vitamin regimen  reviewed. Tessalon per orders. Quarantine reviewed in detail. Strict ER precautions discussed with patient.    Follow Up Instructions: I discussed the assessment and treatment plan with the patient. The patient was provided an opportunity to ask questions and all were answered. The patient agreed with the plan and demonstrated an understanding of the instructions.  A copy of instructions were sent to the patient via MyChart unless otherwise noted below.    The patient was advised to call back or seek an in-person evaluation if the symptoms worsen or if the condition fails to improve as anticipated.  Time:  I spent 10 minutes with the patient via telehealth technology discussing the above problems/concerns.    Piedad Climes, PA-C

## 2023-08-18 ENCOUNTER — Ambulatory Visit (INDEPENDENT_AMBULATORY_CARE_PROVIDER_SITE_OTHER): Payer: Medicare Other | Admitting: Primary Care

## 2023-08-18 ENCOUNTER — Encounter: Payer: Self-pay | Admitting: Primary Care

## 2023-08-18 VITALS — BP 138/82 | HR 82 | Temp 97.0°F | Ht 64.5 in | Wt 215.0 lb

## 2023-08-18 DIAGNOSIS — R053 Chronic cough: Secondary | ICD-10-CM | POA: Insufficient documentation

## 2023-08-18 DIAGNOSIS — J029 Acute pharyngitis, unspecified: Secondary | ICD-10-CM | POA: Diagnosis not present

## 2023-08-18 HISTORY — DX: Chronic cough: R05.3

## 2023-08-18 LAB — POCT RAPID STREP A (OFFICE): Rapid Strep A Screen: NEGATIVE

## 2023-08-18 MED ORDER — BENZONATATE 100 MG PO CAPS
100.0000 mg | ORAL_CAPSULE | Freq: Three times a day (TID) | ORAL | 0 refills | Status: DC | PRN
Start: 2023-08-18 — End: 2023-09-23

## 2023-08-18 NOTE — Patient Instructions (Signed)
You may take Benzonatate capsules for cough. Take 1 capsule by mouth three times daily as needed for cough.  Start taking Zyrtec daily for your throat drainage.  If you start to experience heartburn symptoms then add famotidine (Pepcid) 20 mg once daily in addition to your omeprazole (Prilosec).  Please update me if you do not continue to improve.  It was a pleasure to see you today!

## 2023-08-18 NOTE — Assessment & Plan Note (Signed)
Secondary to COVID-19 infection from 4 weeks ago. Exam today reassuring.  Fortunately, she is feeling better but seems to be a slow process.  Given normal vital signs, coupled with clear lungs and improved symptoms, we will forego chest x-ray at this time.  She agrees.  Rapid strep negative.  Discussed to start Zyrtec for her postnasal drip.  Continue Tylenol/ibuprofen as needed. Refills provided for benzonatate capsules.  Continue to monitor symptoms.  She will update if symptoms do not continue to improve.

## 2023-08-18 NOTE — Progress Notes (Signed)
Subjective:    Patient ID: Jamie Koch, female    DOB: 06-23-1956, 67 y.o.   MRN: [id number]  Cough Associated symptoms include postnasal drip and a sore throat. Pertinent negatives include no chills, fever, headaches or shortness of breath.    Jamie Koch is a very pleasant 67 y.o. female with a history of GERD, GI bleed, hypothyroidism, iron deficiency anemia, prediabetes who presents today to discuss ongoing cough.  Evaluated on 07/14/2023 via telemedicine for COVID-19 infection.  That day she tested positive with a home test.  She was treated with Paxlovid and Tessalon Perles. Symptoms, body aches, fatigue, included bad cough, headache, sore throat.  She completed her course of Paxlovid, felt better. A few weeks later she visited her grandson in [place], her grandson had been sick with a cough at the time. When she returned home from her visit she began to experience cough, headache, body aches, fatigue.   Since then she's noticed improvement in her symptoms. She continues to experience post nasal drip, cough, sore throat, throat congestion, and body aches.  She feels better today than she did last week.  She's been taking Tylenol, Ibuprofen, and "antihistamine". She denies fevers, shortness of breath, chest tightness. She is compliant to her omeprazole 40 mg daily, is experiencing burning to her throat which she thinks is from post nasal drip.   Review of Systems  Constitutional:  Positive for fatigue. Negative for chills and fever.  HENT:  Positive for postnasal drip and sore throat. Negative for congestion.   Respiratory:  Positive for cough. Negative for chest tightness and shortness of breath.   Musculoskeletal:  Positive for arthralgias.  Neurological:  Negative for headaches.         Past Medical History:  Diagnosis Date   Arthritis    Chicken pox    Depression    Eye irritation 07/17/2020   Hay fever    High cholesterol    Hypothyroidism    Osteoporosis    PMB  (postmenopausal bleeding) 07/17/2020   Thyroid dysfunction     Social History   Socioeconomic History   Marital status: Married    Spouse name: Not on file   Number of children: Not on file   Years of education: Not on file   Highest education level: Not on file  Occupational History   Not on file  Tobacco Use   Smoking status: Never   Smokeless tobacco: Never  Vaping Use   Vaping status: Never Used  Substance and Sexual Activity   Alcohol use: No   Drug use: No   Sexual activity: Yes  Other Topics Concern   Not on file  Social History Narrative   Married.   1 child.   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    Enjoys reading, gardening.    Social Determinants of Health   Financial Resource Strain: Low Risk  (08/12/2022)   Received from [facility], [facility]   Overall Financial Resource Strain (CARDIA)    Difficulty of Paying Living Expenses: Not very hard  Food Insecurity: No Food Insecurity (12/28/2022)   Hunger Vital Sign    Worried About Running Out of Food in the Last Year: Never true    Ran Out of Food in the Last Year: Never true  Transportation Needs: No Transportation Needs (12/28/2022)   PRAPARE - Administrator, Civil Service (Medical): No    Lack of Transportation (Non-Medical): No  Physical Activity: Insufficiently Active (08/12/2022)   [facility]   Exercise Vital Sign    Days of Exercise per Week: 1 day    Minutes of Exercise per Session: 50 min  Stress: No Stress Concern Present (08/12/2022)   Received from [facility] of Occupational Health - Occupational Stress Questionnaire    Feeling of Stress : Only a little  Social Connections: Moderately Integrated (08/12/2022)   Received from [facility], [facility]   Social Network    How would you rate your social network (family, work, friends)?: Adequate participation with social networks  Intimate Partner Violence:  Not At Risk (12/23/2022)   Humiliation, Afraid, Rape, and Kick questionnaire    Fear of Current or Ex-Partner: No    Emotionally Abused: No    Physically Abused: No    Sexually Abused: No    Past Surgical History:  Procedure Laterality Date   COLONOSCOPY WITH PROPOFOL N/A 12/24/2022   Procedure: COLONOSCOPY WITH PROPOFOL;  Surgeon: Wyline Mood, MD;  [facility]COPY;  Service: Endoscopy;  Laterality: N/A;   DILATATION & CURETTAGE/HYSTEROSCOPY WITH MYOSURE N/A 07/29/2020   Procedure: FRACTIONAL DILATATION & CURETTAGE/HYSTEROSCOPY WITH MYOSURE RESECTION OF ENDOMETRIAL POLYP;  Surgeon: Schermerhorn, Ihor Austin, MD;  Location: [facility] ORS;  Service: Gynecology;  Laterality: N/A;   ESOPHAGOGASTRODUODENOSCOPY (EGD) WITH PROPOFOL N/A 12/24/2022   Procedure: ESOPHAGOGASTRODUODENOSCOPY (EGD) WITH PROPOFOL;  Surgeon: Wyline Mood, MD;  Location: [facility] ENDOSCOPY;  Service: Endoscopy;  Laterality: N/A;   ESOPHAGOGASTRODUODENOSCOPY (EGD) WITH PROPOFOL N/A 03/24/2023   Procedure: ESOPHAGOGASTRODUODENOSCOPY (EGD) WITH PROPOFO;  Surgeon: Toney Reil, MD;  Location: [facility] ENDOSCOPY;  Service: Gastroenterology;  Laterality: N/A;   LAPAROSCOPIC TOTAL HYSTERECTOMY  07/02/2022   PARTIAL KNEE ARTHROPLASTY Left    TONSILLECTOMY AND ADENOIDECTOMY  1964   TOTAL KNEE ARTHROPLASTY Right     History reviewed. No pertinent family history.  Allergies  Allergen Reactions   Covid-19 (Mrna) Vaccine Other (See Comments)    Pfizer covid-19 vaccine. Caused left arm tingling, headaches, burning skin, vaginal bleeding.   Lactose Intolerance (Gi)     Upset stomach    Medroxyprogesterone Itching    Current Outpatient Medications on File Prior to Visit  Medication Sig Dispense Refill   Acetaminophen (TYLENOL ARTHRITIS PAIN PO) Take 2 tablets by mouth.     ARMOUR THYROID 15 MG tablet Take 1 tablet by mouth daily.     diphenhydrAMINE (BENADRYL) 25 MG tablet Take 25 mg by mouth at bedtime as needed for allergies or  sleep.     Ferrous Sulfate Dried ([employer] SLOW RELEASE IRON) 45 MG TBCR Take by mouth.     gabapentin (NEURONTIN) 300 MG capsule Take 1 capsule (300 mg total) by mouth at bedtime. For pain. 90 capsule 3   omeprazole (PRILOSEC) 40 MG capsule Take 1 capsule (40 mg total) by mouth daily before breakfast. 90 capsule 3   OVER THE COUNTER MEDICATION Place 2 sprays into both nostrils daily as needed (allergies). Sovereign Silver nasal spray     PRESCRIPTION MEDICATION Testosterone 100 mg pellets placed under the skin every 3 months     PRESCRIPTION MEDICATION Estradiol 6 mg pellets placed under the skin every 3 months     progesterone (PROMETRIUM) 100 MG capsule Take 100 mg by mouth every evening.     simvastatin (ZOCOR) 40 MG tablet TAKE 1/2 TABLET BY MOUTH DAILY FOR CHOLESTEROL 45 tablet 2   thyroid (ARMOUR) 60 MG tablet Take 60 mg by mouth daily before breakfast.  triamcinolone cream (KENALOG) 0.1 % Apply 1 Application topically daily.     Vitamin D-Vitamin K (VITAMIN K2-VITAMIN D3 PO) Take 3 tablets by mouth daily.     No current facility-administered medications on file prior to visit.    BP 138/82   Pulse 82   Temp (!) 97 F (36.1 C) (Temporal)   Ht 5' 4.5" (1.638 m)   Wt 215 lb (97.5 kg)   SpO2 98%   BMI 36.33 kg/m  Objective:   Physical Exam Constitutional:      Appearance: She is not ill-appearing.  HENT:     Right Ear: Tympanic membrane and ear canal normal.     Left Ear: Tympanic membrane and ear canal normal.     Nose: No mucosal edema.     Right Sinus: No maxillary sinus tenderness or frontal sinus tenderness.     Left Sinus: No maxillary sinus tenderness or frontal sinus tenderness.     Mouth/Throat:     Mouth: Mucous membranes are moist.  Eyes:     Conjunctiva/sclera: Conjunctivae normal.  Cardiovascular:     Rate and Rhythm: Normal rate and regular rhythm.  Pulmonary:     Effort: Pulmonary effort is normal.     Breath sounds: Normal breath sounds. No wheezing or  rhonchi.  Musculoskeletal:     Cervical back: Neck supple.  Skin:    General: Skin is warm and dry.           Assessment & Plan:  Persistent cough for 3 weeks or longer Assessment & Plan: Secondary to COVID-19 infection from 4 weeks ago. Exam today reassuring.  Fortunately, she is feeling better but seems to be a slow process.  Given normal vital signs, coupled with clear lungs and improved symptoms, we will forego chest x-ray at this time.  She agrees.  Rapid strep negative.  Discussed to start Zyrtec for her postnasal drip.  Continue Tylenol/ibuprofen as needed. Refills provided for benzonatate capsules.  Continue to monitor symptoms.  She will update if symptoms do not continue to improve.  Orders: -     Benzonatate; Take 1 capsule (100 mg total) by mouth 3 (three) times daily as needed for cough.  Dispense: 15 capsule; Refill: 0  Sore throat -     POCT rapid strep A        Doreene Nest, NP

## 2023-08-31 ENCOUNTER — Telehealth: Payer: Self-pay

## 2023-08-31 ENCOUNTER — Ambulatory Visit (INDEPENDENT_AMBULATORY_CARE_PROVIDER_SITE_OTHER): Payer: Medicare Other

## 2023-08-31 VITALS — Ht 64.5 in | Wt 215.0 lb

## 2023-08-31 DIAGNOSIS — Z0000 Encounter for general adult medical examination without abnormal findings: Secondary | ICD-10-CM

## 2023-08-31 DIAGNOSIS — D500 Iron deficiency anemia secondary to blood loss (chronic): Secondary | ICD-10-CM

## 2023-08-31 NOTE — Progress Notes (Signed)
Subjective:   [place] Bruckner is a 67 y.o. female who presents for Medicare Annual (Subsequent) preventive examination.  Visit Complete: Virtual I connected with  Harrell Lark on 08/31/23 by a audio enabled telemedicine application and verified that I am speaking with the correct person using two identifiers.  Patient Location: Home  Provider Location: Office/Clinic  I discussed the limitations of evaluation and management by telemedicine. The patient expressed understanding and agreed to proceed.  Vital Signs: Because this visit was a virtual/telehealth visit, some criteria may be missing or patient reported. Any vitals not documented were not able to be obtained and vitals that have been documented are patient reported.  Patient Medicare AWV questionnaire was completed by the patient on (not done); I have confirmed that all information answered by patient is correct and no changes since this date. Cardiac Risk Factors include: advanced age (>62men, >73 women);dyslipidemia;obesity (BMI >30kg/m2);sedentary lifestyle    Objective:    Today's Vitals   08/31/23 1059 08/31/23 1100  Weight: 215 lb (97.5 kg)   Height: 5' 4.5" (1.638 m)   PainSc:  5    Body mass index is 36.33 kg/m.     08/31/2023   11:12 AM 03/24/2023    6:53 AM 12/23/2022    9:00 AM 02/03/2022    9:02 AM 07/29/2020   10:36 AM 07/23/2020   11:04 AM 06/15/2020   12:24 PM  Advanced Directives  Does Patient Have a Medical Advance Directive? Yes Yes No Yes Yes Yes Yes  Type of Estate agent of [place];Living will Healthcare Power of [place];Living will  Healthcare Power of [employer] of [place];Living will Healthcare Power of [place];Living will Living will  Does patient want to make changes to medical advance directive?     No - Patient declined No - Patient declined   Copy of Healthcare Power of Attorney in Chart? No - copy requested   No - copy requested No - copy requested No -  copy requested   Would patient like information on creating a medical advance directive?   No - Patient declined No - Patient declined       Current Medications (verified) Outpatient Encounter Medications as of 08/31/2023  Medication Sig   Acetaminophen (TYLENOL ARTHRITIS PAIN PO) Take 2 tablets by mouth.   ARMOUR THYROID 15 MG tablet Take 1 tablet by mouth daily.   benzonatate (TESSALON) 100 MG capsule Take 1 capsule (100 mg total) by mouth 3 (three) times daily as needed for cough.   diphenhydrAMINE (BENADRYL) 25 MG tablet Take 25 mg by mouth at bedtime as needed for allergies or sleep.   Ferrous Sulfate Dried ([employer]S SLOW RELEASE IRON) 45 MG TBCR Take by mouth.   gabapentin (NEURONTIN) 300 MG capsule Take 1 capsule (300 mg total) by mouth at bedtime. For pain.   omeprazole (PRILOSEC) 40 MG capsule Take 1 capsule (40 mg total) by mouth daily before breakfast.   OVER THE COUNTER MEDICATION Place 2 sprays into both nostrils daily as needed (allergies). Sovereign Silver nasal spray   PRESCRIPTION MEDICATION Testosterone 100 mg pellets placed under the skin every 3 months   PRESCRIPTION MEDICATION Estradiol 6 mg pellets placed under the skin every 3 months   progesterone (PROMETRIUM) 100 MG capsule Take 100 mg by mouth every evening.   simvastatin (ZOCOR) 40 MG tablet TAKE 1/2 TABLET BY MOUTH DAILY FOR CHOLESTEROL   thyroid (ARMOUR) 60 MG tablet Take 60 mg by mouth daily before breakfast.   triamcinolone cream (  KENALOG) 0.1 % Apply 1 Application topically daily.   Vitamin D-Vitamin K (VITAMIN K2-VITAMIN D3 PO) Take 3 tablets by mouth daily.   No facility-administered encounter medications on file as of 08/31/2023.    Allergies (verified) Covid-19 (mrna) vaccine, Lactose intolerance (gi), and Medroxyprogesterone   History: Past Medical History:  Diagnosis Date   Arthritis    Chicken pox    Depression    Eye irritation 07/17/2020   Hay fever    High cholesterol    Hypothyroidism     Osteoporosis    PMB (postmenopausal bleeding) 07/17/2020   Thyroid dysfunction    Past Surgical History:  Procedure Laterality Date   COLONOSCOPY WITH PROPOFOL N/A 12/24/2022   Procedure: COLONOSCOPY WITH PROPOFOL;  Surgeon: Wyline Mood, MD;  Location: [facility] ENDOSCOPY;  Service: Endoscopy;  Laterality: N/A;   DILATATION & CURETTAGE/HYSTEROSCOPY WITH MYOSURE N/A 07/29/2020   Procedure: FRACTIONAL DILATATION & CURETTAGE/HYSTEROSCOPY WITH MYOSURE RESECTION OF ENDOMETRIAL POLYP;  Surgeon: Schermerhorn, Ihor Austin, MD;  Location: [facility] ORS;  Service: Gynecology;  Laterality: N/A;   ESOPHAGOGASTRODUODENOSCOPY (EGD) WITH PROPOFOL N/A 12/24/2022   Procedure: ESOPHAGOGASTRODUODENOSCOPY (EGD) WITH PROPOFOL;  Surgeon: Wyline Mood, MD;  Location: [facility] ENDOSCOPY;  Service: Endoscopy;  Laterality: N/A;   ESOPHAGOGASTRODUODENOSCOPY (EGD) WITH PROPOFOL N/A 03/24/2023   Procedure: ESOPHAGOGASTRODUODENOSCOPY (EGD) WITH PROPOFO;  Surgeon: Toney Reil, MD;  Location: [facility] ENDOSCOPY;  Service: Gastroenterology;  Laterality: N/A;   LAPAROSCOPIC TOTAL HYSTERECTOMY  07/02/2022   PARTIAL KNEE ARTHROPLASTY Left    TONSILLECTOMY AND ADENOIDECTOMY  1964   TOTAL KNEE ARTHROPLASTY Right    No family history on file. Social History   Socioeconomic History   Marital status: Married    Spouse name: Not on file   Number of children: Not on file   Years of education: Not on file   Highest education level: Not on file  Occupational History   Not on file  Tobacco Use   Smoking status: Never   Smokeless tobacco: Never  Vaping Use   Vaping status: Never Used  Substance and Sexual Activity   Alcohol use: No   Drug use: No   Sexual activity: Yes  Other Topics Concern   Not on file  Social History Narrative   Married.   1 child.   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    Enjoys reading, gardening.    Social Determinants of Health   Financial Resource Strain: Low Risk  (08/31/2023)   Overall Financial  Resource Strain (CARDIA)    Difficulty of Paying Living Expenses: Not hard at all  Food Insecurity: No Food Insecurity (08/31/2023)   Hunger Vital Sign    Worried About Running Out of Food in the Last Year: Never true    Ran Out of Food in the Last Year: Never true  Transportation Needs: No Transportation Needs (08/31/2023)   PRAPARE - Administrator, Civil Service (Medical): No    Lack of Transportation (Non-Medical): No  Physical Activity: Inactive (08/31/2023)   Exercise Vital Sign    Days of Exercise per Week: 0 days    Minutes of Exercise per Session: 0 min  Stress: No Stress Concern Present (08/31/2023)   [employer] of Occupational Health - Occupational Stress Questionnaire    Feeling of Stress : Only a little  Social Connections: Moderately Integrated (08/31/2023)   Social Connection and Isolation Panel [NHANES]    Frequency of Communication with Friends and Family: More than three times a week    Frequency of Social Gatherings  with Friends and Family: Never    Attends Religious Services: More than 4 times per year    Active Member of Clubs or Organizations: No    Attends Engineer, structural: Never    Marital Status: Married    Tobacco Counseling Counseling given: Not Answered   Clinical Intake:  Pre-visit preparation completed: No  Pain : 0-10 Pain Score: 5  Pain Type: Chronic pain Pain Location: Leg (both legs and feet) Pain Descriptors / Indicators: Aching Pain Onset: More than a month ago Pain Frequency: Intermittent Pain Relieving Factors: Tylenol and gabapentin  Pain Relieving Factors: Tylenol and gabapentin  BMI - recorded: 36.33 Nutritional Status: BMI > 30  Obese Nutritional Risks: None Diabetes: No  How often do you need to have someone help you when you read instructions, pamphlets, or other written materials from your doctor or pharmacy?: 1 - Never  Interpreter Needed?: No  Comments: lives with husband Information  entered by :: B.Bill Yohn,LPN   Activities of Daily Living    08/31/2023   11:14 AM 12/23/2022    9:00 AM  In your present state of health, do you have any difficulty performing the following activities:  Hearing? 0 0  Vision? 0 0  Difficulty concentrating or making decisions? 1 0  Walking or climbing stairs? 0 0  Dressing or bathing? 0 0  Doing errands, shopping? 0 0  Preparing Food and eating ? N   Using the Toilet? N   In the past six months, have you accidently leaked urine? N   Do you have problems with loss of bowel control? N   Managing your Medications? N   Managing your Finances? N   Housekeeping or managing your Housekeeping? N     Patient Care Team: Doreene Nest, NP as PCP - General (Internal Medicine) Schermerhorn, Ihor Austin, MD as Referring Physician (Obstetrics and Gynecology) [facility], [facility])  Indicate any recent Medical Services you may have received from other than [facility] providers in the past year (date may be approximate).     Assessment:   This is a routine wellness examination for [place].  Hearing/Vision screen Hearing Screening - Comments:: Pt says she has ringing in her ears (constantly) but more at night; but hears well Vision Screening - Comments:: Pt says she had cataract surgery and no longer needs glasses; pt says sees well [facility]   Goals Addressed   None    Depression Screen    08/31/2023   11:09 AM 01/06/2023   10:32 AM 10/19/2022    9:39 AM 02/03/2022    9:13 AM 10/15/2021   11:18 AM 09/25/2020    8:18 AM  PHQ 2/9 Scores  PHQ - 2 Score 0 0 0 0 0 0  PHQ- 9 Score   2  0 0    Fall Risk    08/31/2023   11:04 AM 01/06/2023   10:32 AM 10/19/2022    9:40 AM 02/03/2022    9:03 AM 10/15/2021   11:19 AM  Fall Risk   Falls in the past year? 0 0 0 0 0  Number falls in past yr: 0 0 0 0 0  Injury with Fall? 0 0 0 0 0  Risk for fall due to : No Fall Risks No Fall Risks     Follow up Education provided;Falls  prevention discussed Falls evaluation completed  Falls evaluation completed;Education provided;Falls prevention discussed     MEDICARE RISK AT HOME: Medicare Risk at Home Any  stairs in or around the home?: Yes If so, are there any without handrails?: Yes Home free of loose throw rugs in walkways, pet beds, electrical cords, etc?: Yes Adequate lighting in your home to reduce risk of falls?: Yes Life alert?: No Use of a cane, walker or w/c?: No Grab bars in the bathroom?: No Shower chair or bench in shower?: No Elevated toilet seat or a handicapped toilet?: No  TIMED UP AND GO:  Was the test performed?  No    Cognitive Function:        08/31/2023   11:15 AM  6CIT Screen  What Year? 0 points  What month? 0 points  What time? 0 points  Count back from 20 0 points  Months in reverse 0 points  Repeat phrase 0 points  Total Score 0 points    Immunizations Immunization History  Administered Date(s) Administered   Fluad Quad(high Dose 65+) 08/23/2021, 08/09/2022   Influenza,inj,Quad PF,6+ Mos 07/30/2016, 08/10/2017, 07/17/2018   Influenza,inj,quad, With Preservative 07/30/2016   Influenza-Unspecified 07/30/2016, 07/17/2018, 07/10/2019, 07/24/2020   Janssen (J&J) SARS-COV-2 Vaccination 05/02/2021   PFIZER(Purple Top)SARS-COV-2 Vaccination 02/28/2020   Pneumococcal Conjugate-13 09/25/2020   Pneumococcal Polysaccharide-23 11/09/2009   Tdap 11/09/2014   Zoster Recombinant(Shingrix) 12/30/2020, 03/31/2021   Zoster, Live 10/20/2016    TDAP status: Up to date  Flu Vaccine status: Up to date  Pneumococcal vaccine status: Up to date  Covid-19 vaccine status: Completed vaccines  Qualifies for Shingles Vaccine? Yes   Zostavax completed Yes   Shingrix Completed?: Yes  Screening Tests Health Maintenance  Topic Date Due   INFLUENZA VACCINE  02/07/2024 (Originally 06/10/2023)   Fecal DNA (Cologuard)  08/16/2024   Medicare Annual Wellness (AWV)  08/30/2024   MAMMOGRAM   10/16/2024   DTaP/Tdap/Td (2 - Td or Tdap) 11/09/2024   Pneumonia Vaccine 48+ Years old (3 of 3 - PPSV23 or PCV20) 09/25/2025   DEXA SCAN  Completed   Hepatitis C Screening  Completed   Zoster Vaccines- Shingrix  Completed   HPV VACCINES  Aged Out   Colonoscopy  Discontinued   COVID-19 Vaccine  Discontinued    Health Maintenance  There are no preventive care reminders to display for this patient.   Colorectal cancer screening: Type of screening: Cologuard. Completed 08/16/2021. Repeat every 3 years  Mammogram status: Completed 10/16/22. Repeat every year  Bone Density status: Completed 01/26/22. Results reflect: Bone density results: NORMAL. Repeat every 3-5 years.  Lung Cancer Screening: (Low Dose CT Chest recommended if Age 69-80 years, 20 pack-year currently smoking OR have quit w/in 15years.) does not qualify.   Lung Cancer Screening Referral: no  Additional Screening:  Hepatitis C Screening: does not qualify; Completed 10/14/2016  Vision Screening: Recommended annual ophthalmology exams for early detection of glaucoma and other disorders of the eye. Is the patient up to date with their annual eye exam?  Yes  Who is the provider or what is the name of the office in which the patient attends annual eye exams? [facility] If pt is not established with a provider, would they like to be referred to a provider to establish care? Yes .   Dental Screening: Recommended annual dental exams for proper oral hygiene  Diabetic Foot Exam: n/a  Community Resource Referral / Chronic Care Management: CRR required this visit?  No   CCM required this visit?  Appt scheduled with PCP    Plan:     I have personally reviewed and noted the following in the patient's  chart:   Medical and social history Use of alcohol, tobacco or illicit drugs  Current medications and supplements including opioid prescriptions. Patient is not currently taking opioid prescriptions. Functional ability and  status Nutritional status Physical activity Advanced directives List of other physicians Hospitalizations, surgeries, and ER visits in previous 12 months Vitals Screenings to include cognitive, depression, and falls Referrals and appointments  In addition, I have reviewed and discussed with patient certain preventive protocols, quality metrics, and best practice recommendations. A written personalized care plan for preventive services as well as general preventive health recommendations were provided to patient.    Sue Lush, LPN   19/14/7829   After Visit Summary: (MyChart) Due to this being a telephonic visit, the after visit summary with patients personalized plan was offered to patient via MyChart   Nurse Notes: The patient states she is doing well and has no concerns or questions at this time.

## 2023-08-31 NOTE — Patient Instructions (Signed)
[name] , Thank you for taking time to come for your Medicare Wellness Visit. I appreciate your ongoing commitment to your health goals. Please review the following plan we discussed and let me know if I can assist you in the future.   Referrals/Orders/Follow-Ups/Clinician Recommendations: none  This is a list of the screening recommended for you and due dates:  Health Maintenance  Topic Date Due   Flu Shot  02/07/2024*   Cologuard (Stool DNA test)  08/16/2024   Medicare Annual Wellness Visit  08/30/2024   Mammogram  10/16/2024   DTaP/Tdap/Td vaccine (2 - Td or Tdap) 11/09/2024   Pneumonia Vaccine (3 of 3 - PPSV23 or PCV20) 09/25/2025   DEXA scan (bone density measurement)  Completed   Hepatitis C Screening  Completed   Zoster (Shingles) Vaccine  Completed   HPV Vaccine  Aged Out   Colon Cancer Screening  Discontinued   COVID-19 Vaccine  Discontinued  *Topic was postponed. The date shown is not the original due date.    Advanced directives: (Copy Requested) Please bring a copy of your health care power of attorney and living will to the office to be added to your chart at your convenience.  Next Medicare Annual Wellness Visit scheduled for next year: Yes 08/31/2024 @ 10:45am telephone

## 2023-08-31 NOTE — Telephone Encounter (Signed)
Called patient verbalized understanding and states she will come for labs one day this week

## 2023-08-31 NOTE — Telephone Encounter (Signed)
Order lab work for [employer]

## 2023-08-31 NOTE — Telephone Encounter (Signed)
Called and left a message for back

## 2023-08-31 NOTE — Telephone Encounter (Signed)
-----   Message from [facility] sent at 05/31/2023  8:34 AM EDT ----- Recheck CBC, folic acid, B12 levels and iron panel in 3 months

## 2023-09-01 LAB — CBC WITH DIFFERENTIAL/PLATELET
Basophils Absolute: 0 10*3/uL (ref 0.0–0.2)
Basos: 1 %
EOS (ABSOLUTE): 0.4 10*3/uL (ref 0.0–0.4)
Eos: 6 %
Hematocrit: 44 % (ref 34.0–46.6)
Hemoglobin: 13.7 g/dL (ref 11.1–15.9)
Immature Grans (Abs): 0 10*3/uL (ref 0.0–0.1)
Immature Granulocytes: 0 %
Lymphocytes Absolute: 2.4 10*3/uL (ref 0.7–3.1)
Lymphs: 35 %
MCH: 25.4 pg — ABNORMAL LOW (ref 26.6–33.0)
MCHC: 31.1 g/dL — ABNORMAL LOW (ref 31.5–35.7)
MCV: 82 fL (ref 79–97)
Monocytes Absolute: 0.7 10*3/uL (ref 0.1–0.9)
Monocytes: 10 %
Neutrophils Absolute: 3.4 10*3/uL (ref 1.4–7.0)
Neutrophils: 48 %
Platelets: 343 10*3/uL (ref 150–450)
RBC: 5.39 x10E6/uL — ABNORMAL HIGH (ref 3.77–5.28)
RDW: 15.3 % (ref 11.7–15.4)
WBC: 6.8 10*3/uL (ref 3.4–10.8)

## 2023-09-01 LAB — IRON,TIBC AND FERRITIN PANEL
Ferritin: 12 ng/mL — ABNORMAL LOW (ref 15–150)
Iron Saturation: 13 % — ABNORMAL LOW (ref 15–55)
Iron: 44 ug/dL (ref 27–139)
Total Iron Binding Capacity: 339 ug/dL (ref 250–450)
UIBC: 295 ug/dL (ref 118–369)

## 2023-09-01 LAB — B12 AND FOLATE PANEL
Folate: >20 ng/mL (ref >3.0)
Vitamin B-12: 595 pg/mL (ref 232–1245)

## 2023-09-03 ENCOUNTER — Telehealth: Payer: Self-pay

## 2023-09-03 NOTE — Telephone Encounter (Signed)
Put a reminder for 3 months. Called patient and left a message for call back

## 2023-09-03 NOTE — Telephone Encounter (Signed)
-----   Message from [facility] sent at 09/02/2023  2:55 PM EDT ----- Labs revealed resolution of anemia.  Still has iron deficiency.  Her B12 and folate levels are normal Recommend to continue iron supplements for next 3 months and recheck iron panel in 3 months  RV

## 2023-09-03 NOTE — Telephone Encounter (Signed)
Patient verbalized understanding of results  

## 2023-09-22 ENCOUNTER — Other Ambulatory Visit: Payer: Self-pay

## 2023-09-22 DIAGNOSIS — M1990 Unspecified osteoarthritis, unspecified site: Secondary | ICD-10-CM

## 2023-09-22 MED ORDER — GABAPENTIN 300 MG PO CAPS
300.0000 mg | ORAL_CAPSULE | Freq: Every day | ORAL | 0 refills | Status: DC
Start: 2023-09-22 — End: 2023-12-03

## 2023-09-22 NOTE — Telephone Encounter (Signed)
Patient is due for general follow up, this will be required prior to any further refills.  Please schedule, thank you!

## 2023-09-22 NOTE — Telephone Encounter (Signed)
LVM for patient to c/b and schedule.  

## 2023-09-23 ENCOUNTER — Encounter: Payer: Self-pay | Admitting: Primary Care

## 2023-09-23 ENCOUNTER — Ambulatory Visit (INDEPENDENT_AMBULATORY_CARE_PROVIDER_SITE_OTHER): Payer: Medicare Other | Admitting: Primary Care

## 2023-09-23 ENCOUNTER — Ambulatory Visit
Admission: RE | Admit: 2023-09-23 | Discharge: 2023-09-23 | Disposition: A | Discharge status: Home / Self Care | Payer: Medicare Other | Source: Ambulatory Visit | Attending: Primary Care | Admitting: Primary Care

## 2023-09-23 VITALS — BP 152/86 | HR 76 | Temp 97.2°F | Ht 64.5 in | Wt 216.0 lb

## 2023-09-23 DIAGNOSIS — K219 Gastro-esophageal reflux disease without esophagitis: Secondary | ICD-10-CM

## 2023-09-23 DIAGNOSIS — E785 Hyperlipidemia, unspecified: Secondary | ICD-10-CM | POA: Diagnosis not present

## 2023-09-23 DIAGNOSIS — R7303 Prediabetes: Secondary | ICD-10-CM | POA: Diagnosis not present

## 2023-09-23 DIAGNOSIS — E039 Hypothyroidism, unspecified: Secondary | ICD-10-CM

## 2023-09-23 DIAGNOSIS — M25552 Pain in left hip: Secondary | ICD-10-CM

## 2023-09-23 DIAGNOSIS — M1990 Unspecified osteoarthritis, unspecified site: Secondary | ICD-10-CM

## 2023-09-23 DIAGNOSIS — M545 Low back pain, unspecified: Secondary | ICD-10-CM

## 2023-09-23 DIAGNOSIS — G8929 Other chronic pain: Secondary | ICD-10-CM | POA: Diagnosis not present

## 2023-09-23 DIAGNOSIS — D509 Iron deficiency anemia, unspecified: Secondary | ICD-10-CM

## 2023-09-23 LAB — HEMOGLOBIN A1C: Hgb A1c MFr Bld: 6.6 % — ABNORMAL HIGH (ref 4.6–6.5)

## 2023-09-23 LAB — HEPATIC FUNCTION PANEL
ALT: 18 U/L (ref 0–35)
AST: 17 U/L (ref 0–37)
Albumin: 4.4 g/dL (ref 3.5–5.2)
Alkaline Phosphatase: 54 U/L (ref 39–117)
Bilirubin, Direct: 0.1 mg/dL (ref 0.0–0.3)
Total Bilirubin: 0.5 mg/dL (ref 0.2–1.2)
Total Protein: 6.7 g/dL (ref 6.0–8.3)

## 2023-09-23 LAB — LIPID PANEL
Cholesterol: 147 mg/dL (ref 0–200)
HDL: 32.1 mg/dL — ABNORMAL LOW (ref >39.00)
LDL Cholesterol: 66 mg/dL (ref 0–99)
NonHDL: 115.39
Total CHOL/HDL Ratio: 5
Triglycerides: 249 mg/dL — ABNORMAL HIGH (ref 0.0–149.0)
VLDL: 49.8 mg/dL — ABNORMAL HIGH (ref 0.0–40.0)

## 2023-09-23 MED ORDER — SIMVASTATIN 40 MG PO TABS: ORAL_TABLET | ORAL | 3 refills | Status: DC

## 2023-09-23 NOTE — Assessment & Plan Note (Signed)
Continue simvastatin 20 mg daily. Repeat lipid panel pending. 

## 2023-09-23 NOTE — Assessment & Plan Note (Signed)
Likely osteoarthritis.  Checking plain films of left hip today. Offered physical therapy for which she kindly declined. Await results.

## 2023-09-23 NOTE — Assessment & Plan Note (Signed)
Likely osteoarthritis. No alarm signs on exam or HPI.  Plain films of the lumbar spine pending. Offered physical therapy for which she kindly declines. Await results.

## 2023-09-23 NOTE — Assessment & Plan Note (Signed)
Chronic.  Continue gabapentin 300 to 600 mg at bedtime as needed.

## 2023-09-23 NOTE — Assessment & Plan Note (Signed)
Repeat A1c pending. 

## 2023-09-23 NOTE — Assessment & Plan Note (Signed)
Following with [facility] MD Continue Armour Thyroid 90 mg daily.

## 2023-09-23 NOTE — Assessment & Plan Note (Signed)
Controlled.  Continue omeprazole 40 mg daily. Following with GI.

## 2023-09-23 NOTE — Patient Instructions (Signed)
Stop by the lab and xray prior to leaving today. I will notify you of your results once received.   It was a pleasure to see you today!   

## 2023-09-23 NOTE — Progress Notes (Signed)
Subjective:    Patient ID: Jamie Koch, female    DOB: 02/04/1956, 67 y.o.   MRN: [id number]  HPI  Jamie Koch is a very pleasant 67 y.o. female with a history of GERD, gastric ulcer, GI bleed, peripheral neuropathy, osteoarthritis, osteopenia, iron deficiency anemia, prediabetes, dyslipidemia who presents today for follow-up of chronic conditions.  1) Hypothyroidism: Currently managed on Armour Thyroid 90 mg daily. Following with [facility] MD.   Also managed on Prometrium, also has testosterone pellets. Feels well managed.    2) Osteoarthritis: Currently managed on gabapentin 300 mg at bedtime. She is actually taking 600 mg PRN. Feels well managed on this regimen.   3) GERD/PUD/History of GI Bleed: Currently managed on omeprazole 40 mg daily. Following with GI.   4) Hyperlipidemia: Currently managed on simvastatin 40 mg daily.  She is due for repeat lipid panel today.  5) Chronic Back Pain: Chronic for years and located to the left lower back and left hip. Progressing over the years. Her pain is worse with sitting, especially when standing after sitting for a long period of time. Walking helps. She denies radiculopathy, loss bowel/bladder control.   She has never completed PT or an xray.  Immunizations: -Tetanus: Completed in 2016 -Influenza: completed this season  -Shingles: Completed Shingrix series -Pneumonia: Completed Prevnar 13 in 2021, Pneumovax 23 in 2011  Mammogram: December 2023 Bone Density Scan: March 2023  Colonoscopy: Completed in 2024, unknown when due. Follows with GI.   BP Readings from Last 3 Encounters:  09/23/23 (!) 152/86  08/18/23 138/82  03/24/23 133/83      Review of Systems  Respiratory:  Negative for shortness of breath.   Cardiovascular:  Negative for chest pain.  Gastrointestinal:  Negative for blood in stool, constipation and diarrhea.  Musculoskeletal:  Positive for arthralgias.  Neurological:  Negative for dizziness, numbness and  headaches.  Psychiatric/Behavioral:  The patient is not nervous/anxious.          Past Medical History:  Diagnosis Date   Anemia due to acute blood loss 12/23/2022   Arthritis    Chicken pox    Depression    Diverticulosis of intestine with bleeding 12/25/2022   Eye irritation 07/17/2020   Fracture of head of radius 04/14/2016   Gastric ulcer 12/24/2022   GI bleeding 12/22/2022   Hay fever    High cholesterol    Hypothyroidism    Osteoporosis    Persistent cough for 3 weeks or longer 08/18/2023   PMB (postmenopausal bleeding) 07/17/2020   PUD (peptic ulcer disease) 03/24/2023   Thyroid dysfunction     Social History   Socioeconomic History   Marital status: Married    Spouse name: Not on file   Number of children: Not on file   Years of education: Not on file   Highest education level: Not on file  Occupational History   Not on file  Tobacco Use   Smoking status: Never   Smokeless tobacco: Never  Vaping Use   Vaping status: Never Used  Substance and Sexual Activity   Alcohol use: No   Drug use: No   Sexual activity: Yes  Other Topics Concern   Not on file  Social History Narrative   Married.   1 child.   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    Enjoys reading, gardening.    Social Determinants of Health   Financial Resource Strain: Low Risk  (08/31/2023)   Overall Financial Resource Strain (CARDIA)    Difficulty  of Paying Living Expenses: Not hard at all  Food Insecurity: No Food Insecurity (08/31/2023)   Hunger Vital Sign    Worried About Running Out of Food in the Last Year: Never true    Ran Out of Food in the Last Year: Never true  Transportation Needs: No Transportation Needs (08/31/2023)   PRAPARE - Administrator, Civil Service (Medical): No    Lack of Transportation (Non-Medical): No  Physical Activity: Inactive (08/31/2023)   Exercise Vital Sign    Days of Exercise per Week: 0 days    Minutes of Exercise per Session: 0 min   Stress: No Stress Concern Present (08/31/2023)   [employer] of Occupational Health - Occupational Stress Questionnaire    Feeling of Stress : Only a little  Social Connections: Moderately Integrated (08/31/2023)   Social Connection and Isolation Panel [NHANES]    Frequency of Communication with Friends and Family: More than three times a week    Frequency of Social Gatherings with Friends and Family: Never    Attends Religious Services: More than 4 times per year    Active Member of [employer] or Organizations: No    Attends Banker Meetings: Never    Marital Status: Married  Catering manager Violence: Not At Risk (08/31/2023)   Humiliation, Afraid, Rape, and Kick questionnaire    Fear of Current or Ex-Partner: No    Emotionally Abused: No    Physically Abused: No    Sexually Abused: No    Past Surgical History:  Procedure Laterality Date   COLONOSCOPY WITH PROPOFOL N/A 12/24/2022   Procedure: COLONOSCOPY WITH PROPOFOL;  Surgeon: Wyline Mood, MD;  Location: [facility] ENDOSCOPY;  Service: Endoscopy;  Laterality: N/A;   DILATATION & CURETTAGE/HYSTEROSCOPY WITH MYOSURE N/A 07/29/2020   Procedure: FRACTIONAL DILATATION & CURETTAGE/HYSTEROSCOPY WITH MYOSURE RESECTION OF ENDOMETRIAL POLYP;  Surgeon: Schermerhorn, Ihor Austin, MD;  Location: [facility] ORS;  Service: Gynecology;  Laterality: N/A;   ESOPHAGOGASTRODUODENOSCOPY (EGD) WITH PROPOFOL N/A 12/24/2022   Procedure: ESOPHAGOGASTRODUODENOSCOPY (EGD) WITH PROPOFOL;  Surgeon: Wyline Mood, MD;  Location: [facility] END[facility]COPY;  Service: Endoscopy;  Laterality: N/A;   ESOPHAGOGASTRODUODENOSCOPY (EGD) WITH PROPOFOL N/A 03/24/2023   Procedure: ESOPHAGOGASTRODUODENOSCOPY (EGD) WITH PROPOFO;  Surgeon: Toney Reil, MD;  Location: [facility] ENDOSCOPY;  Service: Gastroenterology;  Laterality: N/A;   LAPAROSCOPIC TOTAL HYSTERECTOMY  07/02/2022   PARTIAL KNEE ARTHROPLASTY Left    TONSILLECTOMY AND ADENOIDECTOMY  1964   TOTAL KNEE ARTHROPLASTY Right      History reviewed. No pertinent family history.  Allergies  Allergen Reactions   Covid-19 (Mrna) Vaccine Other (See Comments)    Pfizer covid-19 vaccine. Caused left arm tingling, headaches, burning skin, vaginal bleeding.   Lactose Intolerance (Gi)     Upset stomach    Medroxyprogesterone Itching    Current Outpatient Medications on File Prior to Visit  Medication Sig Dispense Refill   Acetaminophen (TYLENOL ARTHRITIS PAIN PO) Take 2 tablets by mouth.     diphenhydrAMINE (BENADRYL) 25 MG tablet Take 25 mg by mouth at bedtime as needed for allergies or sleep.     Ferrous Sulfate Dried ([facility] SLOW RELEASE IRON) 45 MG TBCR Take by mouth.     gabapentin (NEURONTIN) 300 MG capsule Take 1 capsule (300 mg total) by mouth at bedtime. For pain. 90 capsule 0   omeprazole (PRILOSEC) 40 MG capsule Take 1 capsule (40 mg total) by mouth daily before breakfast. 90 capsule 3   OVER THE COUNTER MEDICATION Place 2 sprays into both nostrils  daily as needed (allergies). Sovereign Silver nasal spray     PRESCRIPTION MEDICATION Testosterone 100 mg pellets placed under the skin every 3 months     PRESCRIPTION MEDICATION Estradiol 6 mg pellets placed under the skin every 3 months     progesterone (PROMETRIUM) 100 MG capsule Take 100 mg by mouth every evening.     thyroid (ARMOUR) 90 MG tablet Take 1 tablet by mouth daily.     triamcinolone cream (KENALOG) 0.1 % Apply 1 Application topically daily.     Vitamin D-Vitamin K (VITAMIN K2-VITAMIN D3 PO) Take 3 tablets by mouth daily.     No current facility-administered medications on file prior to visit.    BP (!) 152/86   Pulse 76   Temp (!) 97.2 F (36.2 C) (Temporal)   Ht 5' 4.5" (1.638 m)   Wt 216 lb (98 kg)   SpO2 97%   BMI 36.50 kg/m  Objective:   Physical Exam Cardiovascular:     Rate and Rhythm: Normal rate and regular rhythm.  Pulmonary:     Effort: Pulmonary effort is normal.     Breath sounds: Normal breath sounds.  Abdominal:      Palpations: Abdomen is soft.     Tenderness: There is no abdominal tenderness.  Musculoskeletal:     Cervical back: Neck supple.  Skin:    General: Skin is warm and dry.  Neurological:     Mental Status: She is alert and oriented to person, place, and time.  Psychiatric:        Mood and Affect: Mood normal.           Assessment & Plan:  Chronic left-sided low back pain without sciatica Assessment & Plan: Likely osteoarthritis. No alarm signs on exam or HPI.  Plain films of the lumbar spine pending. Offered physical therapy for which she kindly declines. Await results.  Orders: -     DG Lumbar Spine 2-3 Views  Chronic left hip pain Assessment & Plan: Likely osteoarthritis.  Checking plain films of left hip today. Offered physical therapy for which she kindly declined. Await results.  Orders: -     DG HIP UNILAT W OR W/O PELVIS 2-3 VIEWS LEFT  Hyperlipidemia, unspecified hyperlipidemia type Assessment & Plan: Continue simvastatin 20 mg daily. Repeat lipid panel pending.  Orders: -     Lipid panel -     Hepatic function panel  Prediabetes Assessment & Plan: Repeat A1c pending.  Orders: -     Hemoglobin A1c  Hyperlipidemia Assessment & Plan: Continue simvastatin 20 mg daily. Repeat lipid panel pending.  Orders: -     Simvastatin; TAKE 1/2 TABLET BY MOUTH DAILY FOR CHOLESTEROL  Dispense: 45 tablet; Refill: 3  GERD without esophagitis Assessment & Plan: Controlled.  Continue omeprazole 40 mg daily. Following with GI.   Hypothyroidism, unspecified type Assessment & Plan: Following with [facility] Continue Armour Thyroid 90 mg daily.   Osteoarthritis, unspecified osteoarthritis type, unspecified site Assessment & Plan: Chronic.  Continue gabapentin 300 to 600 mg at bedtime as needed.   Iron deficiency anemia, unspecified iron deficiency anemia type Assessment & Plan: Following with GI. Reviewed labs from October 2024.  Continue iron  supplementation.         Doreene Nest, NP

## 2023-09-23 NOTE — Assessment & Plan Note (Signed)
Following with GI. Reviewed labs from October 2024.  Continue iron supplementation.

## 2023-09-24 DIAGNOSIS — E1165 Type 2 diabetes mellitus with hyperglycemia: Secondary | ICD-10-CM

## 2023-09-27 MED ORDER — METFORMIN HCL ER 500 MG PO TB24: 500.0000 mg | ORAL_TABLET | Freq: Every day | ORAL | 0 refills | Status: DC

## 2023-11-25 ENCOUNTER — Other Ambulatory Visit: Payer: Self-pay | Admitting: Primary Care

## 2023-11-25 DIAGNOSIS — M1990 Unspecified osteoarthritis, unspecified site: Secondary | ICD-10-CM

## 2023-12-02 ENCOUNTER — Other Ambulatory Visit: Payer: Self-pay | Admitting: Primary Care

## 2023-12-02 DIAGNOSIS — M1990 Unspecified osteoarthritis, unspecified site: Secondary | ICD-10-CM

## 2023-12-06 ENCOUNTER — Telehealth: Payer: Self-pay

## 2023-12-06 DIAGNOSIS — D500 Iron deficiency anemia secondary to blood loss (chronic): Secondary | ICD-10-CM

## 2023-12-06 NOTE — Telephone Encounter (Signed)
Order lab work and patient states she will go and have this done as soon as she can

## 2023-12-06 NOTE — Telephone Encounter (Signed)
-----   Message from [facility] [employer] [facility] sent at 09/03/2023  7:55 AM EDT -----  recheck iron panel in 3 months

## 2023-12-11 LAB — IRON,TIBC AND FERRITIN PANEL
Ferritin: 25 ng/mL (ref 15–150)
Iron Saturation: 16 % (ref 15–55)
Iron: 58 ug/dL (ref 27–139)
Total Iron Binding Capacity: 357 ug/dL (ref 250–450)
UIBC: 299 ug/dL (ref 118–369)

## 2023-12-13 ENCOUNTER — Encounter: Payer: Self-pay | Admitting: Gastroenterology

## 2023-12-27 ENCOUNTER — Other Ambulatory Visit: Payer: Self-pay

## 2023-12-27 DIAGNOSIS — E1165 Type 2 diabetes mellitus with hyperglycemia: Secondary | ICD-10-CM

## 2023-12-27 NOTE — Telephone Encounter (Signed)
Unable to reach patient. Unable to leave voicemail.  

## 2023-12-27 NOTE — Telephone Encounter (Signed)
 Please call patient:  She needs a diabetes follow up scheduled for now. Is she still taking metformin daily? Is she running low?

## 2023-12-27 NOTE — Telephone Encounter (Signed)
  Called and spoke with patient, she has sent ,mychart message in regards to the metformin. Scheduled patient for DM f/u Feb 25th

## 2024-01-04 ENCOUNTER — Ambulatory Visit (INDEPENDENT_AMBULATORY_CARE_PROVIDER_SITE_OTHER): Payer: Medicare Other | Admitting: Primary Care

## 2024-01-04 ENCOUNTER — Telehealth: Payer: Self-pay

## 2024-01-04 VITALS — BP 138/84 | HR 75 | Temp 97.2°F | Ht 64.5 in | Wt 214.0 lb

## 2024-01-04 DIAGNOSIS — Z7984 Long term (current) use of oral hypoglycemic drugs: Secondary | ICD-10-CM

## 2024-01-04 DIAGNOSIS — E1165 Type 2 diabetes mellitus with hyperglycemia: Secondary | ICD-10-CM

## 2024-01-04 DIAGNOSIS — M25552 Pain in left hip: Secondary | ICD-10-CM

## 2024-01-04 DIAGNOSIS — G8929 Other chronic pain: Secondary | ICD-10-CM | POA: Diagnosis not present

## 2024-01-04 DIAGNOSIS — M25571 Pain in right ankle and joints of right foot: Secondary | ICD-10-CM | POA: Insufficient documentation

## 2024-01-04 LAB — URIC ACID: Uric Acid, Serum: 7.3 mg/dL — ABNORMAL HIGH (ref 2.4–7.0)

## 2024-01-04 LAB — POCT GLYCOSYLATED HEMOGLOBIN (HGB A1C): Hemoglobin A1C: 6 % — AB (ref 4.0–5.6)

## 2024-01-04 LAB — MICROALBUMIN / CREATININE URINE RATIO
Creatinine,U: 65.5 mg/dL
Microalb Creat Ratio: 65.5 mg/g — ABNORMAL HIGH (ref 0.0–30.0)
Microalb, Ur: 4.3 mg/dL — ABNORMAL HIGH (ref 0.0–1.9)

## 2024-01-04 MED ORDER — PREDNISONE 20 MG PO TABS
ORAL_TABLET | ORAL | 0 refills | Status: DC
Start: 2024-01-04 — End: 2024-06-16

## 2024-01-04 MED ORDER — GLIPIZIDE ER 2.5 MG PO TB24
2.5000 mg | ORAL_TABLET | Freq: Every day | ORAL | 1 refills | Status: DC
Start: 2024-01-04 — End: 2024-06-16

## 2024-01-04 NOTE — Assessment & Plan Note (Signed)
 Differentials include osteoarthritis versus gout.  Uric acid level ordered and pending. Start prednisone tablets. Take two tablets my mouth once daily in the morning for four days, then one tablet once daily in the morning for four days.

## 2024-01-04 NOTE — Assessment & Plan Note (Signed)
 Acute on chronic flare with sciatica.  Start prednisone tablets. Take two tablets my mouth once daily in the morning for four days, then one tablet once daily in the morning for four days.  Follow up PRN

## 2024-01-04 NOTE — Assessment & Plan Note (Signed)
 Improved and controlled with A1C of 6.0 today!  Unfortunately, she experienced intolerable side effects with metformin. We discussed other options for treatment including conservative dietary changes/exercise versus other medications.  She prefers to try medication.  We discussed GLP-1 agonist treatment for which she does not wish to start.  She opts for glipizide.  Start glipizide XL 2.5 mg daily with breakfast. Repeat A1c in 3 months, follow-up in 6 months.  Foot exam today. Urine microalbumin pending.

## 2024-01-04 NOTE — Addendum Note (Signed)
 Addended by: Doreene Nest on: 01/04/2024 01:31 PM   Modules accepted: Orders

## 2024-01-04 NOTE — Telephone Encounter (Signed)
 Pt.notified

## 2024-01-04 NOTE — Telephone Encounter (Signed)
 Please apologize on my behalf.  I just sent the prescription to [facility].

## 2024-01-04 NOTE — Patient Instructions (Addendum)
 Stop by the lab prior to leaving today. I will notify you of your results once received.   Start prednisone tablets. Take two tablets my mouth once daily in the morning for four days, then one tablet once daily in the morning for four days.   Start glipizide ER 2.5 mg once daily with breakfast for diabetes.  Schedule a lab only appointment for 3 months to recheck your diabetes levels.  Schedule a follow-up visit for 6 months.  It was a pleasure to see you today!

## 2024-01-04 NOTE — Progress Notes (Signed)
 Subjective:    Patient ID: Jamie Koch, female    DOB: 1956-01-20, 69 y.o.   MRN: [id number]  HPI  Jamie Koch is a very pleasant 68 y.o. female with a history of hypothyroidism, hyperlipidemia, prediabetes, new onset type 2 diabetes who presents today for follow-up of diabetes. She would also like to discuss ankle and hip pain.  1) Type 2 Diabetes: Current medications include: Metformin ER 500 mg daily. She stopped taking metformin one week ago due to esophageal reflux. Symptoms of reflux abated once she stopped metformin.  She is checking her blood glucose 1 times weekly and is getting readings of:  AM fasting: low 100s  Last A1C: 6.6 in November 2024, 6.0 today Last Eye Exam: Up-to-date Last Foot Exam: Up-to-date Pneumonia Vaccination: 2021 Urine Microalbumin: Due Statin: Simvastatin  Dietary changes since last visit: reducing intake of sugar   Exercise: None  2) Ankle Pain: Acute to the bilateral medial ankles, right worse than left. Her pain is worse with touching the ankle and in the evenings. She denies toe pain, heel pain, color changes, swelling. No prior history of gout.  3) Acute on Chronic Hip/Buttocks Pain: Acute to the left buttocks for the last 1-2 weeks with radiation of pain down to the left posterior mid thigh.   She denies falls, numbness, loss of bowel/bladder control. She's been taking Tylenol and her gabapentin with some improvement.   BP Readings from Last 3 Encounters:  01/04/24 138/84  09/23/23 (!) 152/86  08/18/23 138/82       Review of Systems  Eyes:  Negative for visual disturbance.  Respiratory:  Negative for shortness of breath.   Cardiovascular:  Negative for chest pain.  Musculoskeletal:  Positive for arthralgias. Negative for joint swelling.  Skin:  Negative for color change.  Neurological:  Positive for numbness. Negative for dizziness.         Past Medical History:  Diagnosis Date   Anemia due to acute blood loss  12/23/2022   Arthritis    Chicken pox    Depression    Diverticulosis of intestine with bleeding 12/25/2022   Eye irritation 07/17/2020   Fracture of head of radius 04/14/2016   Gastric ulcer 12/24/2022   GI bleeding 12/22/2022   Hay fever    High cholesterol    Hypothyroidism    Osteoporosis    Persistent cough for 3 weeks or longer 08/18/2023   PMB (postmenopausal bleeding) 07/17/2020   PUD (peptic ulcer disease) 03/24/2023   Thyroid dysfunction     Social History   Socioeconomic History   Marital status: Married    Spouse name: Not on file   Number of children: Not on file   Years of education: Not on file   Highest education level: 12th grade  Occupational History   Not on file  Tobacco Use   Smoking status: Never   Smokeless tobacco: Never  Vaping Use   Vaping status: Never Used  Substance and Sexual Activity   Alcohol use: No   Drug use: No   Sexual activity: Yes  Other Topics Concern   Not on file  Social History Narrative   Married.   1 child.   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    Enjoys reading, gardening.    Social Drivers of Corporate investment banker Strain: Low Risk  (01/04/2024)   Overall Financial Resource Strain (CARDIA)    Difficulty of Paying Living Expenses: Not hard at all  Food Insecurity: No Food Insecurity (  01/04/2024)   Hunger Vital Sign    Worried About Running Out of Food in the Last Year: Never true    Ran Out of Food in the Last Year: Never true  Transportation Needs: No Transportation Needs (01/04/2024)   PRAPARE - Administrator, Civil Service (Medical): No    Lack of Transportation (Non-Medical): No  Physical Activity: Insufficiently Active (01/04/2024)   Exercise Vital Sign    Days of Exercise per Week: 1 day    Minutes of Exercise per Session: 20 min  Stress: No Stress Concern Present (01/04/2024)   [employer] of Occupational Health - Occupational Stress Questionnaire    Feeling of Stress : Only a  little  Social Connections: Socially Integrated (01/04/2024)   Social Connection and Isolation Panel [NHANES]    Frequency of Communication with Friends and Family: Twice a week    Frequency of Social Gatherings with Friends and Family: Once a week    Attends Religious Services: More than 4 times per year    Active Member of Clubs or Organizations: Yes    Attends Banker Meetings: More than 4 times per year    Marital Status: Married  Catering manager Violence: Not At Risk (08/31/2023)   Humiliation, Afraid, Rape, and Kick questionnaire    Fear of Current or Ex-Partner: No    Emotionally Abused: No    Physically Abused: No    Sexually Abused: No    Past Surgical History:  Procedure Laterality Date   COLONOSCOPY WITH PROPOFOL N/A 12/24/2022   Procedure: COLONOSCOPY WITH PROPOFOL;  Surgeon: Wyline Mood, MD;  Location: [facility] ENDOSCOPY;  Service: Endoscopy;  Laterality: N/A;   DILATATION & CURETTAGE/HYSTEROSCOPY WITH MYOSURE N/A 07/29/2020   Procedure: FRACTIONAL DILATATION & CURETTAGE/HYSTEROSCOPY WITH MYOSURE RESECTION OF ENDOMETRIAL POLYP;  Surgeon: Schermerhorn, Ihor Austin, MD;  Location: [facility] ORS;  Service: Gynecology;  Laterality: N/A;   ESOPHAGOGASTRODUODENOSCOPY (EGD) WITH PROPOFOL N/A 12/24/2022   Procedure: ESOPHAGOGASTRODUODENOSCOPY (EGD) WITH PROPOFOL;  Surgeon: Wyline Mood, MD;  Location: [facility] ENDOSCOPY;  Service: Endoscopy;  Laterality: N/A;   ESOPHAGOGASTRODUODENOSCOPY (EGD) WITH PROPOFOL N/A 03/24/2023   Procedure: ESOPHAGOGASTRODUODENOSCOPY (EGD) WITH PROPOFO;  Surgeon: Toney Reil, MD;  Location: [facility] ENDOSCOPY;  Service: Gastroenterology;  Laterality: N/A;   LAPAROSCOPIC TOTAL HYSTERECTOMY  07/02/2022   PARTIAL KNEE ARTHROPLASTY Left    TONSILLECTOMY AND ADENOIDECTOMY  1964   TOTAL KNEE ARTHROPLASTY Right     History reviewed. No pertinent family history.  Allergies  Allergen Reactions   Covid-19 (Mrna) Vaccine Other (See Comments)    Pfizer covid-19  vaccine. Caused left arm tingling, headaches, burning skin, vaginal bleeding.   Lactose Intolerance (Gi)     Upset stomach    Medroxyprogesterone Itching    Current Outpatient Medications on File Prior to Visit  Medication Sig Dispense Refill   Acetaminophen (TYLENOL ARTHRITIS PAIN PO) Take 2 tablets by mouth.     diphenhydrAMINE (BENADRYL) 25 MG tablet Take 25 mg by mouth at bedtime as needed for allergies or sleep.     Ferrous Sulfate Dried ([employer] SLOW RELEASE IRON) 45 MG TBCR Take by mouth.     gabapentin (NEURONTIN) 300 MG capsule TAKE 1 CAPSULE(300 MG) BY MOUTH AT BEDTIME FOR PAIN 90 capsule 2   omeprazole (PRILOSEC) 40 MG capsule Take 1 capsule (40 mg total) by mouth daily before breakfast. 90 capsule 3   OVER THE COUNTER MEDICATION Place 2 sprays into both nostrils daily as needed (allergies). Sovereign Silver nasal spray  PRESCRIPTION MEDICATION Testosterone 100 mg pellets placed under the skin every 3 months     PRESCRIPTION MEDICATION Estradiol 6 mg pellets placed under the skin every 3 months     progesterone (PROMETRIUM) 100 MG capsule Take 100 mg by mouth every evening.     simvastatin (ZOCOR) 40 MG tablet TAKE 1/2 TABLET BY MOUTH DAILY FOR CHOLESTEROL 45 tablet 3   thyroid (ARMOUR) 90 MG tablet Take 1 tablet by mouth daily.     triamcinolone cream (KENALOG) 0.1 % Apply 1 Application topically daily.     Vitamin D-Vitamin K (VITAMIN K2-VITAMIN D3 PO) Take 3 tablets by mouth daily.     No current facility-administered medications on file prior to visit.    BP 138/84   Pulse 75   Temp (!) 97.2 F (36.2 C) (Temporal)   Ht 5' 4.5" (1.638 m)   Wt 214 lb (97.1 kg)   SpO2 98%   BMI 36.17 kg/m  Objective:   Physical Exam Cardiovascular:     Rate and Rhythm: Normal rate and regular rhythm.  Pulmonary:     Effort: Pulmonary effort is normal.     Breath sounds: Normal breath sounds.  Musculoskeletal:     Cervical back: Neck supple.     Lumbar back: Negative right  straight leg raise test and negative left straight leg raise test.     Right ankle: No swelling or ecchymosis. Tenderness present. Normal range of motion.     Left ankle: No swelling or ecchymosis. No tenderness. Normal range of motion.       Legs:  Skin:    General: Skin is warm and dry.  Neurological:     Mental Status: She is alert and oriented to person, place, and time.  Psychiatric:        Mood and Affect: Mood normal.           Assessment & Plan:  Type 2 diabetes mellitus with hyperglycemia, without long-term current use of insulin (HCC) Assessment & Plan: Improved and controlled with A1C of 6.0 today!  Unfortunately, she experienced intolerable side effects with metformin. We discussed other options for treatment including conservative dietary changes/exercise versus other medications.  She prefers to try medication.  We discussed GLP-1 agonist treatment for which she does not wish to start.  She opts for glipizide.  Start glipizide XL 2.5 mg daily with breakfast. Repeat A1c in 3 months, follow-up in 6 months.  Foot exam today. Urine microalbumin pending.  Orders: -     POCT glycosylated hemoglobin (Hb A1C) -     Microalbumin / creatinine urine ratio -     glipiZIDE ER; Take 1 tablet (2.5 mg total) by mouth daily with breakfast. for diabetes.  Dispense: 90 tablet; Refill: 1  Chronic left hip pain Assessment & Plan: Acute on chronic flare with sciatica.  Start prednisone tablets. Take two tablets my mouth once daily in the morning for four days, then one tablet once daily in the morning for four days.  Follow up PRN   Acute right ankle pain Assessment & Plan: Differentials include osteoarthritis versus gout.  Uric acid level ordered and pending. Start prednisone tablets. Take two tablets my mouth once daily in the morning for four days, then one tablet once daily in the morning for four days.    Orders: -     Uric acid        Doreene Nest,  NP

## 2024-01-04 NOTE — Telephone Encounter (Signed)
 Copied from CRM [id number]. Topic: Clinical - Medication Question >> Jan 04, 2024 11:33 AM [place] E wrote: Reason for CRM: Patient called in stating that she was started on Prednisone at her visit today, but she was able to see that the pharmacy is not currently filling that prescription, but they are filling another one of her medications. Advised patient to call pharmacy to make sure they got the order. Just wanted to relay message from patient to Dr. Ophelia Charter office.

## 2024-01-04 NOTE — Telephone Encounter (Signed)
 Do not see where Prednisone was sent to pharmacy today. Please advise.

## 2024-01-06 DIAGNOSIS — E1165 Type 2 diabetes mellitus with hyperglycemia: Secondary | ICD-10-CM

## 2024-01-06 MED ORDER — LOSARTAN POTASSIUM 25 MG PO TABS: 25.0000 mg | ORAL_TABLET | Freq: Every day | ORAL | 0 refills | Status: DC

## 2024-01-13 ENCOUNTER — Other Ambulatory Visit: Payer: Self-pay | Admitting: Primary Care

## 2024-01-13 DIAGNOSIS — M25571 Pain in right ankle and joints of right foot: Secondary | ICD-10-CM

## 2024-01-13 DIAGNOSIS — G8929 Other chronic pain: Secondary | ICD-10-CM

## 2024-02-11 ENCOUNTER — Ambulatory Visit: Admitting: Primary Care

## 2024-02-11 ENCOUNTER — Other Ambulatory Visit

## 2024-02-11 DIAGNOSIS — E1165 Type 2 diabetes mellitus with hyperglycemia: Secondary | ICD-10-CM | POA: Diagnosis not present

## 2024-02-11 LAB — BASIC METABOLIC PANEL WITH GFR
BUN: 9 mg/dL (ref 6–23)
CO2: 31 meq/L (ref 19–32)
Calcium: 9.1 mg/dL (ref 8.4–10.5)
Chloride: 101 meq/L (ref 96–112)
Creatinine, Ser: 0.81 mg/dL (ref 0.40–1.20)
GFR: 74.6 mL/min (ref >60.00)
Glucose, Bld: 107 mg/dL — ABNORMAL HIGH (ref 70–99)
Potassium: 3.5 meq/L (ref 3.5–5.1)
Sodium: 139 meq/L (ref 135–145)

## 2024-02-17 ENCOUNTER — Other Ambulatory Visit: Payer: Self-pay | Admitting: Gastroenterology

## 2024-02-17 NOTE — Telephone Encounter (Signed)
 Last office visit 02/22/2023 Jamie Koch  Plan Peptic ulcer disease, several gastric ulcers based on EGD in 12/2022 Recommend repeat EGD to confirm healing of the ulcers Continue omeprazole 40 mg p.o. twice daily before meals, once ulcers have healed, decrease omeprazole to 40 mg daily Continue to avoid heavy NSAID use Last refill 05/24/2023 3 refills

## 2024-02-17 NOTE — Telephone Encounter (Signed)
 Informed patient she was due for a appointment she states she does not need a refill at this time. Made appointment 03/29/2024

## 2024-03-04 ENCOUNTER — Telehealth: Admitting: Family Medicine

## 2024-03-04 DIAGNOSIS — K625 Hemorrhage of anus and rectum: Secondary | ICD-10-CM | POA: Diagnosis not present

## 2024-03-04 NOTE — Patient Instructions (Signed)
 Jamie Koch, thank you for joining Jamie Roys, PA-C for today's virtual visit.  While this provider is not your primary care provider (PCP), if your PCP is located in our provider database this encounter information will be shared with them immediately following your visit.   A [facility] MyChart account gives you access to today's visit and all your visits, tests, and labs performed at [facility] " click here if you don't have a [facility] MyChart account or go to [email or web address]  Consent: (Patient) Jamie Koch provided verbal consent for this virtual visit at the beginning of the encounter.  Current Medications:  Current Outpatient Medications:    Acetaminophen  (TYLENOL  ARTHRITIS PAIN PO), Take 2 tablets by mouth., Disp: , Rfl:    diphenhydrAMINE  (BENADRYL ) 25 MG tablet, Take 25 mg by mouth at bedtime as needed for allergies or sleep., Disp: , Rfl:    Ferrous Sulfate Dried ([employer]S SLOW RELEASE IRON) 45 MG TBCR, Take by mouth., Disp: , Rfl:    gabapentin  (NEURONTIN ) 300 MG capsule, TAKE 1 CAPSULE(300 MG) BY MOUTH AT BEDTIME FOR PAIN, Disp: 90 capsule, Rfl: 2   glipiZIDE  (GLUCOTROL  XL) 2.5 MG 24 hr tablet, Take 1 tablet (2.5 mg total) by mouth daily with breakfast. for diabetes., Disp: 90 tablet, Rfl: 1   losartan  (COZAAR ) 25 MG tablet, Take 1 tablet (25 mg total) by mouth daily. For kidney protection and blood pressure, Disp: 90 tablet, Rfl: 0   omeprazole  (PRILOSEC) 40 MG capsule, Take 1 capsule (40 mg total) by mouth daily before breakfast., Disp: 90 capsule, Rfl: 3   OVER THE COUNTER MEDICATION, Place 2 sprays into both nostrils daily as needed (allergies). Sovereign Silver  nasal spray, Disp: , Rfl:    predniSONE  (DELTASONE ) 20 MG tablet, Take two tablets my mouth once daily in the morning for four days, then one tablet once daily in the morning for four days., Disp: 12 tablet, Rfl: 0   PRESCRIPTION MEDICATION, Testosterone 100 mg pellets placed under the skin  every 3 months, Disp: , Rfl:    PRESCRIPTION MEDICATION, Estradiol 6 mg pellets placed under the skin every 3 months, Disp: , Rfl:    progesterone  (PROMETRIUM ) 100 MG capsule, Take 100 mg by mouth every evening., Disp: , Rfl:    simvastatin  (ZOCOR ) 40 MG tablet, TAKE 1/2 TABLET BY MOUTH DAILY FOR CHOLESTEROL, Disp: 45 tablet, Rfl: 3   thyroid  (ARMOUR) 90 MG tablet, Take 1 tablet by mouth daily., Disp: , Rfl:    triamcinolone cream (KENALOG) 0.1 %, Apply 1 Application topically daily., Disp: , Rfl:    Vitamin D -Vitamin K (VITAMIN K2-VITAMIN D3 PO), Take 3 tablets by mouth daily., Disp: , Rfl:    Medications ordered in this encounter:  No orders of the defined types were placed in this encounter.    *If you need refills on other medications prior to your next appointment, please contact your pharmacy*  Follow-Up: Call back or seek an in-person evaluation if the symptoms worsen or if the condition fails to improve as anticipated.  [facility] [phone]  Other Instructions Rectal Bleeding  Rectal bleeding is when blood comes out of the opening of the butt (anus). You may see bright red blood in your underwear or in the toilet after you poop (have a bowel movement). You may also have blood mixed with your poop (stool), or dark red or black poop. Rectal bleeding is often a sign that something is wrong. It can be caused by many things. It  needs to be checked by a doctor. Follow these instructions at home: Medicines Take over-the-counter and prescription medicines only as told by your doctor. Ask your doctor about changing or stopping your normal medicines. These include blood thinners. Managing constipation Your condition may cause trouble pooping (constipation). To prevent or treat this, or to help make your poop soft, you may need to: Drink enough fluid to keep your pee (urine) pale yellow. Take over-the-counter or prescription medicines. Eat foods that are high in fiber.  These include beans, whole grains, and fresh fruits and vegetables. Limit foods that are high in fat and sugar. These include fried or sweet foods.  General instructions Try not to strain when you poop. Take a warm bath. This may help with pain. Watch for changes in your symptoms. Contact a doctor if: You have pain or swelling in your belly (abdomen). You have a fever. You feel weak or like you may vomit. You cannot poop. You have new or more bleeding. You have black or dark red poop. You vomit blood or something that looks like coffee grounds. Get help right away if: You faint. You have very bad pain in your butt. These symptoms may be an emergency. Get help right away. Call 911. Do not wait to see if the symptoms will go away. Do not drive yourself to the hospital. This information is not intended to replace advice given to you by your health care provider. Make sure you discuss any questions you have with your health care provider. Document Revised: 06/16/2022 Document Reviewed: 06/16/2022 Elsevier Patient Education  2024 Elsevier Inc.   If you have been instructed to have an in-person evaluation today at a local Urgent Care facility, please use the link below. It will take you to a list of all of our available [facility], including address, phone number and hours of operation. Please do not delay care.  [facility]  If you or a family member do not have a primary care provider, use the link below to schedule a visit and establish care. When you choose a [facility] primary care physician or advanced practice provider, you gain a long-term partner in health. Find a Primary Care Provider  Learn more about [facility]'s in-office and virtual care options: [facility] - Get Care Now

## 2024-03-04 NOTE — Progress Notes (Signed)
 Virtual Visit Consent   Jamie Koch, you are scheduled for a virtual visit with a [facility] provider today. Just as with appointments in the office, your consent must be obtained to participate. Your consent will be active for this visit and any virtual visit you may have with one of our providers in the next 365 days. If you have a MyChart account, a copy of this consent can be sent to you electronically.  As this is a virtual visit, video technology does not allow for your provider to perform a traditional examination. This may limit your provider's ability to fully assess your condition. If your provider identifies any concerns that need to be evaluated in person or the need to arrange testing (such as labs, EKG, etc.), we will make arrangements to do so. Although advances in technology are sophisticated, we cannot ensure that it will always work on either your end or our end. If the connection with a video visit is poor, the visit may have to be switched to a telephone visit. With either a video or telephone visit, we are not always able to ensure that we have a secure connection.  By engaging in this virtual visit, you consent to the provision of healthcare and authorize for your insurance to be billed (if applicable) for the services provided during this visit. Depending on your insurance coverage, you may receive a charge related to this service.  I need to obtain your verbal consent now. Are you willing to proceed with your visit today? Jamie Koch has provided verbal consent on 03/04/2024 for a virtual visit (video or telephone). Jamie Koch, [place]  Date: 03/04/2024 10:41 AM   Virtual Visit via Video Note   I, Jamie Koch, connected with  Jacolyn Matar  ([id number], [birth year]) on 03/04/24 at 10:30 AM EDT by a video-enabled telemedicine application and verified that I am speaking with the correct person using two identifiers.  Location: Patient: Virtual Visit Location Patient:  Home Provider: Virtual Visit Location Provider: Home Office   I discussed the limitations of evaluation and management by telemedicine and the availability of in person appointments. The patient expressed understanding and agreed to proceed.    History of Present Illness: Jamie Koch is a 68 y.o. who identifies as a female who was assigned female at birth, and is being seen today for c/o rectal bleeding.  Pt states when it first happened it was bright red.  Pt states had bowl movement this morning and had some blood on the tissue and in the toilet and then had second bowel movement and had no blood.  Pt states was not a lot blood.  Pt states she has a history of hemorrhoids but states she was placed on steroids for a hip pain. Pt states she finished the steroids. Pt does not report fever, nausea or vomiting. Pt states has a history of diverticulitis but does not report abdominal pain.   Video connection was lost when less than 50% of the duration of the visit was complete, at which time the remainder of the visit was completed via audio only.   HPI: HPI  Problems:  Patient Active Problem List   Diagnosis Date Noted   Acute right ankle pain 01/04/2024   Chronic left-sided low back pain without sciatica 09/23/2023   Chronic left hip pain 09/23/2023   GERD without esophagitis 12/23/2022   Peripheral neuropathy 12/23/2022   Osteopenia 10/15/2021   Welcome to Medicare preventive visit 09/25/2020   Type 2 diabetes mellitus  with hyperglycemia (HCC) 07/11/2019   Preventative health care 10/19/2016   Iron deficiency anemia 04/17/2016   Hypothyroidism 04/17/2016   Hyperlipidemia 04/17/2016   Osteoarthritis 04/17/2016    Allergies:  Allergies  Allergen Reactions   Covid-19 (Mrna) Vaccine Other (See Comments)    Pfizer covid-19 vaccine. Caused left arm tingling, headaches, burning skin, vaginal bleeding.   Lactose Intolerance (Gi)     Upset stomach    Medroxyprogesterone Itching    Medications:  Current Outpatient Medications:    Acetaminophen  (TYLENOL  ARTHRITIS PAIN PO), Take 2 tablets by mouth., Disp: , Rfl:    diphenhydrAMINE  (BENADRYL ) 25 MG tablet, Take 25 mg by mouth at bedtime as needed for allergies or sleep., Disp: , Rfl:    Ferrous Sulfate Dried ([employer]S SLOW RELEASE IRON) 45 MG TBCR, Take by mouth., Disp: , Rfl:    gabapentin  (NEURONTIN ) 300 MG capsule, TAKE 1 CAPSULE(300 MG) BY MOUTH AT BEDTIME FOR PAIN, Disp: 90 capsule, Rfl: 2   glipiZIDE  (GLUCOTROL  XL) 2.5 MG 24 hr tablet, Take 1 tablet (2.5 mg total) by mouth daily with breakfast. for diabetes., Disp: 90 tablet, Rfl: 1   losartan  (COZAAR ) 25 MG tablet, Take 1 tablet (25 mg total) by mouth daily. For kidney protection and blood pressure, Disp: 90 tablet, Rfl: 0   omeprazole  (PRILOSEC) 40 MG capsule, Take 1 capsule (40 mg total) by mouth daily before breakfast., Disp: 90 capsule, Rfl: 3   OVER THE COUNTER MEDICATION, Place 2 sprays into both nostrils daily as needed (allergies). Sovereign Silver  nasal spray, Disp: , Rfl:    predniSONE  (DELTASONE ) 20 MG tablet, Take two tablets my mouth once daily in the morning for four days, then one tablet once daily in the morning for four days., Disp: 12 tablet, Rfl: 0   PRESCRIPTION MEDICATION, Testosterone 100 mg pellets placed under the skin every 3 months, Disp: , Rfl:    PRESCRIPTION MEDICATION, Estradiol 6 mg pellets placed under the skin every 3 months, Disp: , Rfl:    progesterone  (PROMETRIUM ) 100 MG capsule, Take 100 mg by mouth every evening., Disp: , Rfl:    simvastatin  (ZOCOR ) 40 MG tablet, TAKE 1/2 TABLET BY MOUTH DAILY FOR CHOLESTEROL, Disp: 45 tablet, Rfl: 3   thyroid  (ARMOUR) 90 MG tablet, Take 1 tablet by mouth daily., Disp: , Rfl:    triamcinolone cream (KENALOG) 0.1 %, Apply 1 Application topically daily., Disp: , Rfl:    Vitamin D -Vitamin K (VITAMIN K2-VITAMIN D3 PO), Take 3 tablets by mouth daily., Disp: , Rfl:   Observations/Objective: Patient is  well-developed, well-nourished in no acute distress.  Resting comfortably at home.  Head is normocephalic, atraumatic.  No labored breathing.  Speech is clear and coherent with logical content.  Patient is alert and oriented at baseline.    Assessment and Plan: 1. Rectal bleeding (Primary)  -Differential diagnosis included hemorrhoids, blood  loss, adverse affects of medication  -Pt advised to increase fiber and start a sitz bath  -Pt advised that if bleeding persist to proceed to urgent care or emergency room and also follow up with PCP and/or GI specialist -Pt verbalized understanding  Follow Up Instructions: I discussed the assessment and treatment plan with the patient. The patient was provided an opportunity to ask questions and all were answered. The patient agreed with the plan and demonstrated an understanding of the instructions.  A copy of instructions were sent to the patient via MyChart unless otherwise noted below.    The patient was advised to call back  or seek an in-person evaluation if the symptoms worsen or if the condition fails to improve as anticipated.    Jamie Roys, PA-C

## 2024-03-29 ENCOUNTER — Ambulatory Visit (INDEPENDENT_AMBULATORY_CARE_PROVIDER_SITE_OTHER): Admitting: Gastroenterology

## 2024-03-29 ENCOUNTER — Encounter: Payer: Self-pay | Admitting: Gastroenterology

## 2024-03-29 VITALS — BP 179/97 | HR 99 | Temp 98.1°F | Wt 210.0 lb

## 2024-03-29 DIAGNOSIS — Z6836 Body mass index (BMI) 36.0-36.9, adult: Secondary | ICD-10-CM | POA: Insufficient documentation

## 2024-03-29 DIAGNOSIS — K219 Gastro-esophageal reflux disease without esophagitis: Secondary | ICD-10-CM

## 2024-03-29 DIAGNOSIS — E559 Vitamin D deficiency, unspecified: Secondary | ICD-10-CM | POA: Insufficient documentation

## 2024-03-29 DIAGNOSIS — E66812 Obesity, class 2: Secondary | ICD-10-CM | POA: Insufficient documentation

## 2024-03-29 NOTE — Progress Notes (Signed)
 Jamie Oz, MD [address]  [address]  [place], [place] [address]  Main: [phone]  Fax: [phone]    Gastroenterology Consultation  Referring Provider:     Gabriel John, NP Primary Care Physician:  Gabriel John, NP Primary Gastroenterologist:  Dr. Karma Koch Reason for Consultation: GERD symptoms        HPI:   Jamie Koch is a 68 y.o. female referred by Gabriel John, NP  for consultation & management of peptic ulcer disease.  Patient was admitted to [facility] in February secondary to hematochezia, followed by dark red blood and melena.  Has iron deficiency anemia.  Underwent upper endoscopy which revealed several small clean-based ulcers.  Colonoscopy was fair prep, old blood in the left colon only.  She was taking ibuprofen several times daily for arthritis pain.  Patient was discharged on omeprazole  40 mg p.o. twice daily.  She has been taking it consistently, also oral iron daily.  She does not have any GI concerns today.  She reports that she is no longer experiencing epigastric discomfort.  Follow-up visit 03/29/2024 Ms. Sublette is here to discuss about GERD symptoms.  She has been having heartburn.  She does drink coffee daily and also eats chocolate regularly.  Her heartburn is at bedtime.  Does admit to eating white bread in excess portion, late dinner.  Taking omeprazole  40 mg in the morning.  She is no longer iron deficient.  She now has diabetes, her A1c is 6.6.  She leads sedentary lifestyle.  NSAIDs: None  Antiplts/Anticoagulants/Anti thrombotics: None  GI Procedures:  Upper endoscopy 03/24/2023 - Normal duodenal bulb and second portion of the duodenum. - Erythematous mucosa in the antrum and prepyloric region of the stomach. - Small hiatal hernia. - Normal gastric body and incisura. - Normal gastroesophageal junction and esophagus. - No specimens collected.  EGD and colonoscopy 12/24/2022 Normal duodenum, normal esophagus 5  nonbleeding superficial gastric ulcers with clean ulcer base along the greater curvature of the stomach  Fair prep Left colon diverticulosis Blood in the rectum, sigmoid colon and the descending colon Normal retroflexion in the rectum Past Medical History:  Diagnosis Date   Anemia due to acute blood loss 12/23/2022   Arthritis    Chicken pox    Depression    Diverticulosis of intestine with bleeding 12/25/2022   Eye irritation 07/17/2020   Fracture of head of radius 04/14/2016   Gastric ulcer 12/24/2022   GI bleeding 12/22/2022   Hay fever    High cholesterol    Hypothyroidism    Osteoporosis    Persistent cough for 3 weeks or longer 08/18/2023   PMB (postmenopausal bleeding) 07/17/2020   PUD (peptic ulcer disease) 03/24/2023   Thyroid  dysfunction     Past Surgical History:  Procedure Laterality Date   COLONOSCOPY WITH PROPOFOL  N/A 12/24/2022   Procedure: COLONOSCOPY WITH PROPOFOL ;  Surgeon: Luke Salaam, MD;  [facility] ENDOSCOPY;  Service: Endoscopy;  Laterality: N/A;   DILATATION & CURETTAGE/HYSTEROSCOPY WITH MYOSURE N/A 07/29/2020   Procedure: FRACTIONAL DILATATION & CURETTAGE/HYSTEROSCOPY WITH MYOSURE RESECTION OF ENDOMETRIAL POLYP;  Surgeon: Schermerhorn, Joselyn Nicely, MD;  Location: [facility] ORS;  Service: Gynecology;  Laterality: N/A;   ESOPHAGOGASTRODUODENOSCOPY (EGD) WITH PROPOFOL  N/A 12/24/2022   Procedure: ESOPHAGOGASTRODUODENOSCOPY (EGD) WITH PROPOFOL ;  Surgeon: Luke Salaam, MD;  Location[facility] ENDOSCOPY;  Service: Endoscopy;  Laterality: N/A;   ESOPHAGOGASTRODUODENOSCOPY (EGD) WITH PROPOFOL  N/A 03/24/2023   Procedure: ESOPHAGOGASTRODUODENOSCOPY (EGD) WITH PROPOFO;  Surgeon: Selena Daily,  MD;  Location: [facility] ENDOSCOPY;  Service: Gastroenterology;  Laterality: N/A;   LAPAROSCOPIC TOTAL HYSTERECTOMY  07/02/2022   PARTIAL KNEE ARTHROPLASTY Left    TONSILLECTOMY AND ADENOIDECTOMY  1964   TOTAL KNEE ARTHROPLASTY Right     .edscurre   No family history on file.    Social History   Tobacco Use   Smoking status: Never   Smokeless tobacco: Never  Vaping Use   Vaping status: Never Used  Substance Use Topics   Alcohol use: No   Drug use: No    Allergies as of 03/29/2024 - Review Complete 03/29/2024  Allergen Reaction Noted   Covid-19 (mrna) vaccine Other (See Comments) 07/16/2020   Lactose intolerance (gi)  07/16/2020   Medroxyprogesterone Itching 07/16/2020    Review of Systems:    All systems reviewed and negative except where noted in HPI.   Physical Exam:  BP (!) 179/97 (BP Location: Right Arm, Patient Position: Sitting, Cuff Size: Normal)   Pulse 99   Temp 98.1 F (36.7 C) (Oral)   Wt 210 lb (95.3 kg)   BMI 35.49 kg/m  No LMP recorded. Patient has had a hysterectomy.  General:   Alert,  Well-developed, well-nourished, pleasant and cooperative in NAD Head:  Normocephalic and atraumatic. Eyes:  Sclera clear, no icterus.   Conjunctiva pink. Ears:  Normal auditory acuity. Nose:  No deformity, discharge, or lesions. Mouth:  No deformity or lesions,oropharynx pink & moist. Neck:  Supple; no masses or thyromegaly. Lungs:  Respirations even and unlabored.  Clear throughout to auscultation.   No wheezes, crackles, or rhonchi. No acute distress. Heart:  Regular rate and rhythm; no murmurs, clicks, rubs, or gallops. Abdomen:  Normal bowel sounds. Soft, non-tender and non-distended without masses, hepatosplenomegaly or hernias noted.  No guarding or rebound tenderness.   Rectal: Not performed Msk:  Symmetrical without gross deformities. Good, equal movement & strength bilaterally. Pulses:  Normal pulses noted. Extremities:  No clubbing or edema.  No cyanosis. Neurologic:  Alert and oriented x3;  grossly normal neurologically. Skin:  Intact without significant lesions or rashes. No jaundice. Psych:  Alert and cooperative. Normal mood and affect.  Imaging Studies: None  Assessment and Plan:   [name] is a 68 y.o. female with  metabolic syndrome, hypothyroidism, history of peptic ulcer disease in setting of NSAID use, confirmed healing on repeat EGD, no evidence of H. pylori, maintained on PPI, diarrhea resolved is seen in consultation for GERD symptoms  GERD Discussed about healthy lifestyle Advised to take omeprazole  40 mg before dinner Discussed about antireflux lifestyle Take Pepcid  as needed Encouraged to have moderate physical activity daily  Screening colonoscopy Recommend colonoscopy because previous colonoscopy was suboptimal, patient does not want to undergo at this time     Follow up as needed   Jamie Oz, MD

## 2024-04-11 ENCOUNTER — Ambulatory Visit: Admitting: Primary Care

## 2024-04-11 ENCOUNTER — Other Ambulatory Visit (INDEPENDENT_AMBULATORY_CARE_PROVIDER_SITE_OTHER)

## 2024-04-11 ENCOUNTER — Ambulatory Visit: Payer: Self-pay | Admitting: Primary Care

## 2024-04-11 ENCOUNTER — Other Ambulatory Visit: Payer: Self-pay | Admitting: Primary Care

## 2024-04-11 DIAGNOSIS — E1165 Type 2 diabetes mellitus with hyperglycemia: Secondary | ICD-10-CM

## 2024-04-11 LAB — POCT GLYCOSYLATED HEMOGLOBIN (HGB A1C): Hemoglobin A1C: 5.7 % — AB (ref 4.0–5.6)

## 2024-04-12 ENCOUNTER — Other Ambulatory Visit: Payer: Self-pay

## 2024-04-12 DIAGNOSIS — E1165 Type 2 diabetes mellitus with hyperglycemia: Secondary | ICD-10-CM

## 2024-04-12 MED ORDER — LOSARTAN POTASSIUM 25 MG PO TABS
25.0000 mg | ORAL_TABLET | Freq: Every day | ORAL | 0 refills | Status: AC
Start: 2024-04-12 — End: ?

## 2024-06-15 ENCOUNTER — Ambulatory Visit: Payer: Self-pay

## 2024-06-15 NOTE — Telephone Encounter (Signed)
 Noted, will evaluate.

## 2024-06-15 NOTE — Telephone Encounter (Signed)
 FYI Only or Action Required?: FYI only for provider.  Patient was last seen in primary care on 01/04/2024 by Gretta Comer POUR, NP.  Called Nurse Triage reporting Hypoglycemia.  Symptoms began a week ago.  Interventions attempted: Other: stopped her thyroid  (armour) and glipizide .  Symptoms are: hot flashes, sweating, shaky, hypoglycemia completely resolved today.  Triage Disposition: See Physician Within 24 Hours (overriding Call PCP When Office is Open)  Patient/caregiver understands and will follow disposition?: Yes             Copied from CRM #[id number]. Topic: Clinical - Red Word Triage >> Jun 15, 2024 11:05 AM Donna BRAVO wrote: Red Word that prompted transfer to Nurse Triage: patient on new  thyroid  medicine that causes blood sugar to go really low, 06/14/24 was 50 this morning after eating a peach 06/15/24  180  Blood sugar has been running really low, patient has stopped taking thyroid  medication on 06/12/24 and stopped taking glipiZIDE  (GLUCOTROL  XL) 2.5 MG 24 hr tableta week ago, patient  doesn't feel right Reason for Disposition  [1] Morning (before breakfast) blood glucose < 80 mg/dL (4.4 mmol/L) AND [7] more than once in past week  Answer Assessment - Initial Assessment Questions Patient states she also did hormone replacement a week ago. Patient states she has stopped her thyroid  (armour) since Friday or Saturday.   1. SYMPTOMS: What symptoms are you concerned about?     Shaky, sweating, feeling low blood sugar, hot flashes.  2. ONSET:  When did the symptoms start?     Thursday.  3. BLOOD GLUCOSE: What is your blood glucose level?      Over past few days it has been 50 (yesterday), 70, 107. Today after eating 2 peaches she checked her blood sugar just now and it is 180.  4. USUAL RANGE: What is your blood glucose level usually? (e.g., usual fasting morning value, usual evening value)     120.  5. TYPE 1 or 2:  Do you know what type of diabetes you  have?  (e.g., Type 1, Type 2, Gestational; doesn't know)      Type 2.  6. INSULIN: Do you take insulin? What type of insulin(s) do you use? What is the mode of delivery? (syringe, pen; injection or pump) When did you last give yourself an insulin dose? (i.e., time or hours/minutes ago) How much did you give? (i.e., how many units)     No.  7. DIABETES PILLS: Do you take any pills for your diabetes? If Yes, ask: What is the name of the medicine(s) that you take for high blood sugar?     Glipizide . Patient decided to stop the medication a week ago.  8. OTHER SYMPTOMS: Do you have any symptoms? (e.g., fever, frequent urination, difficulty breathing, vomiting)     Patient denies nausea, vomiting, confusion, rapid or difficulty breathing, fever.  9. LOW BLOOD GLUCOSE TREATMENT: What have you done so far to treat the low blood glucose level?     Today BG is 180, on the days when her GB was low she ate lunch. She states on those days she also had waited too late to eat lunch.  10. FOOD: When did you last eat or drink?       Just ate 2 peaches this morning.  11. ALONE: Are you alone right now or is someone with you?        She is with her husband.  12. PREGNANCY: Is there any chance you are  pregnant? When was your last menstrual period?       N/A.  Protocols used: Diabetes - Low Blood Sugar-A-AH

## 2024-06-16 ENCOUNTER — Ambulatory Visit (INDEPENDENT_AMBULATORY_CARE_PROVIDER_SITE_OTHER): Admitting: Primary Care

## 2024-06-16 VITALS — BP 116/74 | HR 72 | Temp 97.1°F | Ht 64.5 in | Wt 216.0 lb

## 2024-06-16 DIAGNOSIS — E1165 Type 2 diabetes mellitus with hyperglycemia: Secondary | ICD-10-CM

## 2024-06-16 DIAGNOSIS — Z7984 Long term (current) use of oral hypoglycemic drugs: Secondary | ICD-10-CM | POA: Diagnosis not present

## 2024-06-16 NOTE — Patient Instructions (Signed)
 Remain off glipizide  for diabetes.  Continue to check your blood sugars as discussed.  Schedule a follow-up visit for 3 months.  It was a pleasure to see you today!

## 2024-06-16 NOTE — Progress Notes (Signed)
 Subjective:    Patient ID: Jamie Koch, female    DOB: 11-03-1956, 68 y.o.   MRN: [id number]  Hypoglycemia Pertinent negatives include no chest pain.    Jamie Koch is a very pleasant 68 y.o. female with a history of type 2 diabetes, hypothyroidism, deficiency anemia, hyperlipidemia who presents today to discuss hypoglycemia.  One week ago she was getting pellets placed for hormones per [facility] MD. That day she began shaking and feeling jittery. The following several days she noticed readings of 50, 78, and 107.  She was told by her [facility] sky MD provider that her symptoms were secondary to her Armour Thyroid .  She stopped taking Armour Thyroid  and Glipizide  a few days ago, glucose level this morning was 158 fasting.   Last A1c was 5.7 in June 2025. She feels well now. Denies feeling shaky and jittery.   BP Readings from Last 3 Encounters:  06/16/24 116/74  03/29/24 (!) 179/97  01/04/24 138/84      Review of Systems  Respiratory:  Negative for shortness of breath.   Cardiovascular:  Negative for chest pain.         Past Medical History:  Diagnosis Date   Anemia due to acute blood loss 12/23/2022   Arthritis    Chicken pox    Depression    Diverticulosis of intestine with bleeding 12/25/2022   Eye irritation 07/17/2020   Fracture of head of radius 04/14/2016   Gastric ulcer 12/24/2022   GI bleeding 12/22/2022   Hay fever    High cholesterol    Hypothyroidism    Osteoporosis    Persistent cough for 3 weeks or longer 08/18/2023   PMB (postmenopausal bleeding) 07/17/2020   PUD (peptic ulcer disease) 03/24/2023   Thyroid  dysfunction     Social History   Socioeconomic History   Marital status: Married    Spouse name: Not on file   Number of children: Not on file   Years of education: Not on file   Highest education level: 12th grade  Occupational History   Not on file  Tobacco Use   Smoking status: Never   Smokeless tobacco: Never  Vaping Use   Vaping  status: Never Used  Substance and Sexual Activity   Alcohol use: No   Drug use: No   Sexual activity: Yes  Other Topics Concern   Not on file  Social History Narrative   Married.   1 child.   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    Enjoys reading, gardening.    Social Drivers of Corporate investment banker Strain: Low Risk  (06/16/2024)   Overall Financial Resource Strain (CARDIA)    Difficulty of Paying Living Expenses: Not hard at all  Food Insecurity: No Food Insecurity (06/16/2024)   Hunger Vital Sign    Worried About Running Out of Food in the Last Year: Never true    Ran Out of Food in the Last Year: Never true  Transportation Needs: No Transportation Needs (06/16/2024)   PRAPARE - Administrator, Civil Service (Medical): No    Lack of Transportation (Non-Medical): No  Physical Activity: Inactive (06/16/2024)   Exercise Vital Sign    Days of Exercise per Week: 0 days    Minutes of Exercise per Session: Not on file  Stress: No Stress Concern Present (06/16/2024)   [employer] of Occupational Health - Occupational Stress Questionnaire    Feeling of Stress: Not at all  Social Connections: Unknown (06/16/2024)  Social Connection and Isolation Panel    Frequency of Communication with Friends and Family: Once a week    Frequency of Social Gatherings with Friends and Family: Patient declined    Attends Religious Services: More than 4 times per year    Active Member of [employer] or Organizations: Not on file    Attends Banker Meetings: Not on file    Marital Status: Married  Intimate Partner Violence: Not At Risk (08/31/2023)   Humiliation, Afraid, Rape, and Kick questionnaire    Fear of Current or Ex-Partner: No    Emotionally Abused: No    Physically Abused: No    Sexually Abused: No    Past Surgical History:  Procedure Laterality Date   COLONOSCOPY WITH PROPOFOL  N/A 12/24/2022   Procedure: COLONOSCOPY WITH PROPOFOL ;  Surgeon: Therisa Bi, MD;   Location: [facility] ENDOSCOPY;  Service: Endoscopy;  Laterality: N/A;   DILATATION & CURETTAGE/HYSTEROSCOPY WITH MYOSURE N/A 07/29/2020   Procedure: FRACTIONAL DILATATION & CURETTAGE/HYSTEROSCOPY WITH MYOSURE RESECTION OF ENDOMETRIAL POLYP;  Surgeon: Schermerhorn, Debby PARAS, MD;  Location: [facility] ORS;  Service: Gynecology;  Laterality: N/A;   ESOPHAGOGASTRODUODENOSCOPY (EGD) WITH PROPOFOL  N/A 12/24/2022   Procedure: ESOPHAGOGASTRODUODENOSCOPY (EGD) WITH PROPOFOL ;  Surgeon: Therisa Bi, MD;  Location: [facility] ENDOSCOPY;  Service: Endoscopy;  Laterality: N/A;   ESOPHAGOGASTRODUODENOSCOPY (EGD) WITH PROPOFOL  N/A 03/24/2023   Procedure: ESOPHAGOGASTRODUODENOSCOPY (EGD) WITH PROPOFO;  Surgeon: Unk Corinn Skiff, MD;  Location: [facility] ENDOSCOPY;  Service: Gastroenterology;  Laterality: N/A;   LAPAROSCOPIC TOTAL HYSTERECTOMY  07/02/2022   PARTIAL KNEE ARTHROPLASTY Left    TONSILLECTOMY AND ADENOIDECTOMY  1964   TOTAL KNEE ARTHROPLASTY Right     No family history on file.  Allergies  Allergen Reactions   Covid-19 (Mrna) Vaccine Other (See Comments)    Pfizer covid-19 vaccine. Caused left arm tingling, headaches, burning skin, vaginal bleeding.   Lactose Intolerance (Gi)     Upset stomach    Medroxyprogesterone Itching    Current Outpatient Medications on File Prior to Visit  Medication Sig Dispense Refill   Acetaminophen  (TYLENOL  ARTHRITIS PAIN PO) Take 2 tablets by mouth.     diphenhydrAMINE  (BENADRYL ) 25 MG tablet Take 25 mg by mouth at bedtime as needed for allergies or sleep.     gabapentin  (NEURONTIN ) 300 MG capsule TAKE 1 CAPSULE(300 MG) BY MOUTH AT BEDTIME FOR PAIN 90 capsule 2   losartan  (COZAAR ) 25 MG tablet Take 1 tablet (25 mg total) by mouth daily. For kidney protection and blood pressure 90 tablet 0   omeprazole  (PRILOSEC) 40 MG capsule Take 1 capsule (40 mg total) by mouth daily before breakfast. 90 capsule 3   OVER THE COUNTER MEDICATION Place 2 sprays into both nostrils daily as needed  (allergies). Sovereign Silver  nasal spray     PRESCRIPTION MEDICATION Testosterone 100 mg pellets placed under the skin every 3 months     PRESCRIPTION MEDICATION Estradiol 6 mg pellets placed under the skin every 3 months     progesterone  (PROMETRIUM ) 100 MG capsule Take 100 mg by mouth every evening.     simvastatin  (ZOCOR ) 40 MG tablet TAKE 1/2 TABLET BY MOUTH DAILY FOR CHOLESTEROL 45 tablet 3   triamcinolone cream (KENALOG) 0.1 % Apply 1 Application topically daily.     Vitamin D -Vitamin K (VITAMIN K2-VITAMIN D3 PO) Take 3 tablets by mouth daily.     Ferrous Sulfate Dried ([facility] SLOW RELEASE IRON) 45 MG TBCR Take by mouth. (Patient not taking: Reported on 06/16/2024)     thyroid  (ARMOUR)  90 MG tablet Take 1 tablet by mouth daily. (Patient not taking: Reported on 06/16/2024)     No current facility-administered medications on file prior to visit.    BP 116/74   Pulse 72   Temp (!) 97.1 F (36.2 C) (Temporal)   Ht 5' 4.5 (1.638 m)   Wt 216 lb (98 kg)   SpO2 97%   BMI 36.50 kg/m  Objective:   Physical Exam Cardiovascular:     Rate and Rhythm: Normal rate and regular rhythm.  Pulmonary:     Effort: Pulmonary effort is normal.     Breath sounds: Normal breath sounds.  Musculoskeletal:     Cervical back: Neck supple.  Skin:    General: Skin is warm and dry.  Neurological:     Mental Status: She is alert and oriented to person, place, and time.  Psychiatric:        Mood and Affect: Mood normal.           Assessment & Plan:  Type 2 diabetes mellitus with hyperglycemia, without long-term current use of insulin (HCC) Assessment & Plan: Well controlled with A1C of 5.7 in June 2025.  Remain off Glipizide  XL 2.5 mg daily given hypoglycemia. We discussed to work on diet, continue monitoring glucose levels.   Follow up in 3 months.          Delta Deshmukh K Dason Mosley, NP

## 2024-06-16 NOTE — Assessment & Plan Note (Signed)
 Well controlled with A1C of 5.7 in June 2025.  Remain off Glipizide  XL 2.5 mg daily given hypoglycemia. We discussed to work on diet, continue monitoring glucose levels.   Follow up in 3 months.

## 2024-07-16 ENCOUNTER — Other Ambulatory Visit: Payer: Self-pay | Admitting: Primary Care

## 2024-07-16 DIAGNOSIS — E1165 Type 2 diabetes mellitus with hyperglycemia: Secondary | ICD-10-CM

## 2024-08-28 ENCOUNTER — Ambulatory Visit: Admitting: Family

## 2024-08-31 ENCOUNTER — Ambulatory Visit

## 2024-09-04 ENCOUNTER — Ambulatory Visit (INDEPENDENT_AMBULATORY_CARE_PROVIDER_SITE_OTHER)

## 2024-09-04 VITALS — BP 130/84 | HR 92 | Temp 98.7°F | Ht 64.5 in | Wt 213.0 lb

## 2024-09-04 DIAGNOSIS — H60501 Unspecified acute noninfective otitis externa, right ear: Secondary | ICD-10-CM

## 2024-09-04 MED ORDER — CIPROFLOXACIN-DEXAMETHASONE 0.3-0.1 % OT SUSP: 4.0000 [drp] | Freq: Two times a day (BID) | OTIC | 0 refills | Status: AC

## 2024-09-04 NOTE — Patient Instructions (Signed)
 Thank you for visiting [facility] today! Here's what we talked about: - Take Omeprazole  in the morning - Start ear drops  - Come back re-evaluation only if: Pain worsening, fever, chills, or unimproved after 5 days

## 2024-09-04 NOTE — Progress Notes (Signed)
 Subjective:   This visit was conducted in person. The patient gave informed consent to the use of Abridge AI technology to record the contents of the encounter as documented below.   Patient ID: Jamie Koch, female    DOB: 03/25/1956, 68 y.o.   MRN: [id number]   Discussed the use of AI scribe software for clinical note transcription with the patient, who gave verbal consent to proceed.  History of Present Illness Jamie Koch is a 68 year old female who presents with right ear pain and associated symptoms.  She has been experiencing right ear pain for the past couple of days, describing it as swollen and painful when pressure is applied or when lying on it. She feels that the pain has been worsening over time. No ear discharge is noted, but there is a slight muffling of hearing and dizziness. She also experiences tinnitus, which she describes as feeling like it's in her whole head, and this started a few days ago along with the other symptoms.  She has a history of a sore throat and rhinorrhea that began several days before the onset of ear pain. No fever or chills are reported. She also reports watery eyes and a tingling sensation in her tongue about a week ago, for which she tested negative for COVID-19. No recent cough is noted, but she confirms rhinorrhea and body aches, which she attributes to her osteoarthritis.  She has been taking acetaminophen , two 650 mg tablets once in the morning and once at night, but reports it has not significantly alleviated her ear pain. She also takes CoQ10 at night for aches, which she finds helpful. She cannot take ibuprofen due to a history of bleeding ulcers.  She mentions a history of heartburn for which she takes omeprazole , and finds it helpful when taken at night. No history of ear infections or injuries to the eardrum is noted, and she does not use earbuds in the affected ear.  Review of Systems  All other systems reviewed and are  negative.       Allergies  Allergen Reactions   Covid-19 (Mrna) Vaccine Other (See Comments)    Pfizer covid-19 vaccine. Caused left arm tingling, headaches, burning skin, vaginal bleeding.   Lactose Intolerance (Gi)     Upset stomach    Medroxyprogesterone Itching    Current Outpatient Medications on File Prior to Visit  Medication Sig Dispense Refill   Acetaminophen  (TYLENOL  ARTHRITIS PAIN PO) Take 2 tablets by mouth.     diphenhydrAMINE  (BENADRYL ) 25 MG tablet Take 25 mg by mouth at bedtime as needed for allergies or sleep.     Ferrous Sulfate Dried ([facility] SLOW RELEASE IRON) 45 MG TBCR Take by mouth.     gabapentin  (NEURONTIN ) 300 MG capsule TAKE 1 CAPSULE(300 MG) BY MOUTH AT BEDTIME FOR PAIN 90 capsule 2   losartan  (COZAAR ) 25 MG tablet Take 1 tablet (25 mg total) by mouth daily. For kidney protection and blood pressure 90 tablet 0   NP THYROID  15 MG tablet Take 15 mg by mouth daily.     NP THYROID  60 MG tablet Take 60 mg by mouth daily.     omeprazole  (PRILOSEC) 40 MG capsule Take 1 capsule (40 mg total) by mouth daily before breakfast. 90 capsule 3   OVER THE COUNTER MEDICATION Place 2 sprays into both nostrils daily as needed (allergies). Sovereign Silver  nasal spray     PRESCRIPTION MEDICATION Testosterone 100 mg pellets placed under the skin every 3 months  PRESCRIPTION MEDICATION Estradiol 6 mg pellets placed under the skin every 3 months     progesterone  (PROMETRIUM ) 100 MG capsule Take 100 mg by mouth every evening.     simvastatin  (ZOCOR ) 40 MG tablet TAKE 1/2 TABLET BY MOUTH DAILY FOR CHOLESTEROL 45 tablet 3   triamcinolone cream (KENALOG) 0.1 % Apply 1 Application topically daily.     Vitamin D -Vitamin K (VITAMIN K2-VITAMIN D3 PO) Take 3 tablets by mouth daily.     No current facility-administered medications on file prior to visit.    BP 130/84 (BP Location: Left Arm, Patient Position: Sitting, Cuff Size: Large)   Pulse 92   Temp 98.7 F (37.1 C) (Oral)   Ht 5'  4.5 (1.638 m)   Wt 213 lb (96.6 kg)   SpO2 95%   BMI 36.00 kg/m   Objective:      Physical Exam GENERAL: Alert, cooperative, well developed, no acute distress. HEAD: Normocephalic atraumatic. EYES: Extraocular movements intact bilaterally, conjunctivae normal bilaterally. EARS: Right ear: canal clean, pain on tragal manipulation, TTP over posterior auricular area, tympanosclerosis noted. Left ear: Tympanic membrane WNL, ear canal and external ear normal on the left. EXTREMITIES: No cyanosis or edema. NEUROLOGICAL: Oriented to person, place and time, no gait abnormalities, moves all extremities without gross motor or sensory deficit.         Assessment & Plan:   Assessment & Plan 1. Acute otitis externa of right ear, unspecified type (Primary) Acute bacterial infection of the outer ear with pain, muffled hearing, tinnitus, and itching. Examination consistent with otitis externa. Possible viral labyrinthitis suggested by recent upper respiratory infection symptoms. Dizziness and tinnitus may be related to inner ear inflammation.  Will treat with antibiotic-steroid combination regimen as below.  - Instructed to return for re-evaluation if symptoms worsen, fever or chills develop, or if symptoms are unimproved after 5 days. - ciprofloxacin-dexamethasone  (CIPRODEX) OTIC suspension; Place 4 drops into the right ear 2 (two) times daily for 7 days.  Dispense: 7.5 mL; Refill: 0     Return for worsening of symptoms or failure to improve.   Tonie Elsey K Asani Deniston, MD  09/04/24     Contains text generated by Abridge.

## 2024-09-12 ENCOUNTER — Other Ambulatory Visit: Payer: Self-pay | Admitting: *Deleted

## 2024-09-12 DIAGNOSIS — E785 Hyperlipidemia, unspecified: Secondary | ICD-10-CM

## 2024-09-12 MED ORDER — SIMVASTATIN 40 MG PO TABS: ORAL_TABLET | ORAL | 0 refills | Status: AC

## 2024-09-15 ENCOUNTER — Other Ambulatory Visit: Payer: Self-pay | Admitting: *Deleted

## 2024-09-15 DIAGNOSIS — M1990 Unspecified osteoarthritis, unspecified site: Secondary | ICD-10-CM

## 2024-09-15 MED ORDER — GABAPENTIN 300 MG PO CAPS: 300.0000 mg | ORAL_CAPSULE | Freq: Every day | ORAL | 0 refills | Status: DC

## 2024-09-19 ENCOUNTER — Ambulatory Visit: Payer: Self-pay | Admitting: Primary Care

## 2024-09-19 ENCOUNTER — Ambulatory Visit (INDEPENDENT_AMBULATORY_CARE_PROVIDER_SITE_OTHER): Admitting: Primary Care

## 2024-09-19 ENCOUNTER — Encounter: Payer: Self-pay | Admitting: Primary Care

## 2024-09-19 VITALS — BP 138/78 | HR 81 | Temp 98.0°F | Ht 64.5 in | Wt 216.4 lb

## 2024-09-19 DIAGNOSIS — E1165 Type 2 diabetes mellitus with hyperglycemia: Secondary | ICD-10-CM | POA: Diagnosis not present

## 2024-09-19 LAB — POCT GLYCOSYLATED HEMOGLOBIN (HGB A1C): Hemoglobin A1C: 6.2 % — AB (ref 4.0–5.6)

## 2024-09-19 NOTE — Assessment & Plan Note (Addendum)
 Slightly deteriorated with A1C of 6.2. .  We discuss options for treatment. She will work on her diet.  Remain off treatment for now.  Follow up in 6 months.

## 2024-09-19 NOTE — Patient Instructions (Signed)
 It is important that you improve your diet. Please limit carbohydrates in the form of white bread, rice, pasta, sweets, fast food, fried food, sugary drinks, etc. Increase your consumption of fresh fruits and vegetables, whole grains, lean protein.  Ensure you are consuming 64 ounces of water daily.  Schedule your annual visit with me on December 31st.   It was a pleasure to see you today!

## 2024-09-19 NOTE — Progress Notes (Signed)
 Subjective:    Patient ID: Jamie Koch, female    DOB: [birth year], 68 y.o.   MRN: [id number]  Jamie Koch is a very pleasant 68 y.o. female with a history of type 2 diabetes, hypothyroidism, osteoarthritis, iron deficiency anemia who presents today for follow-up of diabetes.  1) Type 2 Diabetes:  Current medications include: None.  She is checking her blood glucose 0 times daily.  Last A1C: 5.7 in June 2025, 6.2 today  Last Eye Exam: Up-to-date Last Foot Exam: Up-to-date Pneumonia Vaccination: 2021 Urine Microalbumin: Up-to-date Statin: Simvastatin   Dietary changes since last visit: Both restaurant food and home cooked meals. Good veggie intake, baked protein.    Exercise: None.   BP Readings from Last 3 Encounters:  09/19/24 138/78  09/04/24 130/84  06/16/24 116/74       Review of Systems  Respiratory:  Negative for shortness of breath.   Cardiovascular:  Negative for chest pain.  Neurological:  Positive for numbness. Negative for dizziness.         Past Medical History:  Diagnosis Date   Anemia due to acute blood loss 12/23/2022   Arthritis    Chicken pox    Depression    Diverticulosis of intestine with bleeding 12/25/2022   Eye irritation 07/17/2020   Fracture of head of radius 04/14/2016   Gastric ulcer 12/24/2022   GI bleeding 12/22/2022   Hay fever    High cholesterol    Hypothyroidism    Osteoporosis    Persistent cough for 3 weeks or longer 08/18/2023   PMB (postmenopausal bleeding) 07/17/2020   PUD (peptic ulcer disease) 03/24/2023   Thyroid  dysfunction     Social History   Socioeconomic History   Marital status: Married    Spouse name: Not on file   Number of children: Not on file   Years of education: Not on file   Highest education level: 12th grade  Occupational History   Not on file  Tobacco Use   Smoking status: Never   Smokeless tobacco: Never  Vaping Use   Vaping status: Never Used  Substance and Sexual Activity    Alcohol use: No   Drug use: No   Sexual activity: Yes  Other Topics Concern   Not on file  Social History Narrative   Married.   1 child.   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    Enjoys reading, gardening.    Social Drivers of Corporate Investment Banker Strain: Low Risk  (09/04/2024)   Overall Financial Resource Strain (CARDIA)    Difficulty of Paying Living Expenses: Not hard at all  Food Insecurity: No Food Insecurity (09/04/2024)   Hunger Vital Sign    Worried About Running Out of Food in the Last Year: Never true    Ran Out of Food in the Last Year: Never true  Transportation Needs: No Transportation Needs (09/04/2024)   PRAPARE - Administrator, Civil Service (Medical): No    Lack of Transportation (Non-Medical): No  Physical Activity: Inactive (09/04/2024)   Exercise Vital Sign    Days of Exercise per Week: 0 days    Minutes of Exercise per Session: Not on file  Stress: No Stress Concern Present (09/04/2024)   [employer] of Occupational Health - Occupational Stress Questionnaire    Feeling of Stress: Only a little  Social Connections: Unknown (09/04/2024)   Social Connection and Isolation Panel    Frequency of Communication with Friends and Family: Once a week  Frequency of Social Gatherings with Friends and Family: Patient declined    Attends Religious Services: More than 4 times per year    Active Member of Clubs or Organizations: Yes    Attends Banker Meetings: More than 4 times per year    Marital Status: Married  Catering Manager Violence: Not At Risk (08/31/2023)   Humiliation, Afraid, Rape, and Kick questionnaire    Fear of Current or Ex-Partner: No    Emotionally Abused: No    Physically Abused: No    Sexually Abused: No    Past Surgical History:  Procedure Laterality Date   COLONOSCOPY WITH PROPOFOL  N/A 12/24/2022   Procedure: COLONOSCOPY WITH PROPOFOL ;  Surgeon: Therisa Bi, MD;  Location: [facility] ENDOSCOPY;   Service: Endoscopy;  Laterality: N/A;   DILATATION & CURETTAGE/HYSTEROSCOPY WITH MYOSURE N/A 07/29/2020   Procedure: FRACTIONAL DILATATION & CURETTAGE/HYSTEROSCOPY WITH MYOSURE RESECTION OF ENDOMETRIAL POLYP;  Surgeon: Schermerhorn, Debby PARAS, MD;  Location: [facility] ORS;  Service: Gynecology;  Laterality: N/A;   ESOPHAGOGASTRODUODENOSCOPY (EGD) WITH PROPOFOL  N/A 12/24/2022   Procedure: ESOPHAGOGASTRODUODENOSCOPY (EGD) WITH PROPOFOL ;  Surgeon: Therisa Bi, MD;  Location: [facility] ENDOSCOPY;  Service: Endoscopy;  Laterality: N/A;   ESOPHAGOGASTRODUODENOSCOPY (EGD) WITH PROPOFOL  N/A 03/24/2023   Procedure: ESOPHAGOGASTRODUODENOSCOPY (EGD) WITH PROPOFO;  Surgeon: Unk Corinn Skiff, MD;  Location: [facility] ENDOSCOPY;  Service: Gastroenterology;  Laterality: N/A;   LAPAROSCOPIC TOTAL HYSTERECTOMY  07/02/2022   PARTIAL KNEE ARTHROPLASTY Left    TONSILLECTOMY AND ADENOIDECTOMY  1964   TOTAL KNEE ARTHROPLASTY Right     History reviewed. No pertinent family history.  Allergies  Allergen Reactions   Covid-19 (Mrna) Vaccine Other (See Comments)    Pfizer covid-19 vaccine. Caused left arm tingling, headaches, burning skin, vaginal bleeding.   Lactose Intolerance (Gi)     Upset stomach    Medroxyprogesterone Itching    Current Outpatient Medications on File Prior to Visit  Medication Sig Dispense Refill   Acetaminophen  (TYLENOL  ARTHRITIS PAIN PO) Take 2 tablets by mouth.     ciprofloxacin-dexamethasone  (CIPRODEX) OTIC suspension SHAKE LIQUID AND INSTILL 4 DROPS TO RIGHT EAR TWICE DAILY FOR 7 DAYS     diphenhydrAMINE  (BENADRYL ) 25 MG tablet Take 25 mg by mouth at bedtime as needed for allergies or sleep.     Ferrous Sulfate Dried ([facility] SLOW RELEASE IRON) 45 MG TBCR Take by mouth.     gabapentin  (NEURONTIN ) 300 MG capsule Take 1 capsule (300 mg total) by mouth at bedtime. For pain 90 capsule 0   losartan  (COZAAR ) 25 MG tablet Take 1 tablet (25 mg total) by mouth daily. For kidney protection and blood pressure 90  tablet 0   NP THYROID  15 MG tablet Take 15 mg by mouth daily.     NP THYROID  60 MG tablet Take 60 mg by mouth daily.     omeprazole  (PRILOSEC) 40 MG capsule Take 1 capsule (40 mg total) by mouth daily before breakfast. 90 capsule 3   OVER THE COUNTER MEDICATION Place 2 sprays into both nostrils daily as needed (allergies). Sovereign Silver  nasal spray     PRESCRIPTION MEDICATION Testosterone 100 mg pellets placed under the skin every 3 months     PRESCRIPTION MEDICATION Estradiol 6 mg pellets placed under the skin every 3 months     progesterone  (PROMETRIUM ) 100 MG capsule Take 100 mg by mouth every evening.     simvastatin  (ZOCOR ) 40 MG tablet TAKE 1/2 TABLET BY MOUTH DAILY FOR CHOLESTEROL 45 tablet 0   triamcinolone cream (KENALOG) 0.1 %  Apply 1 Application topically daily.     Vitamin D -Vitamin K (VITAMIN K2-VITAMIN D3 PO) Take 3 tablets by mouth daily.     No current facility-administered medications on file prior to visit.    BP 138/78   Pulse 81   Temp 98 F (36.7 C) (Oral)   Ht 5' 4.5 (1.638 m)   Wt 216 lb 6 oz (98.1 kg)   SpO2 97%   BMI 36.57 kg/m  Objective:   Physical Exam Cardiovascular:     Rate and Rhythm: Normal rate and regular rhythm.  Pulmonary:     Effort: Pulmonary effort is normal.     Breath sounds: Normal breath sounds.  Musculoskeletal:     Cervical back: Neck supple.  Skin:    General: Skin is warm and dry.  Neurological:     Mental Status: She is alert and oriented to person, place, and time.  Psychiatric:        Mood and Affect: Mood normal.     Physical Exam        Assessment & Plan:  Type 2 diabetes mellitus with hyperglycemia, without long-term current use of insulin (HCC) Assessment & Plan: Slightly deteriorated with A1C of 6.2. .  We discuss options for treatment. She will work on her diet.  Remain off treatment for now.  Follow up in 6 months.  Orders: -     POCT glycosylated hemoglobin (Hb A1C)    Assessment and  Plan Assessment & Plan         Comer MARLA Gaskins, NP     History of Present Illness

## 2024-10-18 ENCOUNTER — Ambulatory Visit: Admitting: Primary Care

## 2024-10-25 NOTE — Telephone Encounter (Signed)
 Duplicate message. I have sent message to patient to call for visit for evaluation.

## 2024-10-26 ENCOUNTER — Ambulatory Visit: Admitting: Primary Care

## 2024-10-26 ENCOUNTER — Encounter: Payer: Self-pay | Admitting: Primary Care

## 2024-10-26 VITALS — BP 136/78 | HR 89 | Temp 98.4°F | Ht 63.5 in | Wt 212.1 lb

## 2024-10-26 DIAGNOSIS — E785 Hyperlipidemia, unspecified: Secondary | ICD-10-CM

## 2024-10-26 DIAGNOSIS — E1165 Type 2 diabetes mellitus with hyperglycemia: Secondary | ICD-10-CM

## 2024-10-26 DIAGNOSIS — M1990 Unspecified osteoarthritis, unspecified site: Secondary | ICD-10-CM | POA: Diagnosis not present

## 2024-10-26 DIAGNOSIS — D509 Iron deficiency anemia, unspecified: Secondary | ICD-10-CM | POA: Diagnosis not present

## 2024-10-26 DIAGNOSIS — E039 Hypothyroidism, unspecified: Secondary | ICD-10-CM

## 2024-10-26 DIAGNOSIS — E2839 Other primary ovarian failure: Secondary | ICD-10-CM

## 2024-10-26 DIAGNOSIS — K219 Gastro-esophageal reflux disease without esophagitis: Secondary | ICD-10-CM | POA: Diagnosis not present

## 2024-10-26 LAB — LIPID PANEL
Cholesterol: 174 mg/dL (ref 28–200)
HDL: 32.7 mg/dL — ABNORMAL LOW (ref >39.00)
LDL Cholesterol: 100 mg/dL — ABNORMAL HIGH (ref 10–99)
NonHDL: 141.59
Total CHOL/HDL Ratio: 5
Triglycerides: 208 mg/dL — ABNORMAL HIGH (ref 10.0–149.0)
VLDL: 41.6 mg/dL — ABNORMAL HIGH (ref 0.0–40.0)

## 2024-10-26 LAB — COMPREHENSIVE METABOLIC PANEL WITH GFR
ALT: 15 U/L (ref 3–35)
AST: 12 U/L (ref 5–37)
Albumin: 4.3 g/dL (ref 3.5–5.2)
Alkaline Phosphatase: 61 U/L (ref 39–117)
BUN: 9 mg/dL (ref 6–23)
CO2: 32 meq/L (ref 19–32)
Calcium: 9.7 mg/dL (ref 8.4–10.5)
Chloride: 100 meq/L (ref 96–112)
Creatinine, Ser: 0.77 mg/dL (ref 0.40–1.20)
GFR: 78.88 mL/min (ref >60.00)
Glucose, Bld: 95 mg/dL (ref 70–99)
Potassium: 3.7 meq/L (ref 3.5–5.1)
Sodium: 141 meq/L (ref 135–145)
Total Bilirubin: 0.7 mg/dL (ref 0.2–1.2)
Total Protein: 6.5 g/dL (ref 6.0–8.3)

## 2024-10-26 LAB — CBC
HCT: 45.3 % (ref 36.0–46.0)
Hemoglobin: 15.3 g/dL — ABNORMAL HIGH (ref 12.0–15.0)
MCHC: 33.9 g/dL (ref 30.0–36.0)
MCV: 89.2 fl (ref 78.0–100.0)
Platelets: 330 K/uL (ref 150.0–400.0)
RBC: 5.08 Mil/uL (ref 3.87–5.11)
RDW: 13.3 % (ref 11.5–15.5)
WBC: 10.9 K/uL — ABNORMAL HIGH (ref 4.0–10.5)

## 2024-10-26 MED ORDER — GABAPENTIN 300 MG PO CAPS: 300.0000 mg | ORAL_CAPSULE | Freq: Two times a day (BID) | ORAL | 3 refills | Status: AC

## 2024-10-26 NOTE — Assessment & Plan Note (Signed)
 Repeat lipid panel pending.  Work on a healthy diet and regular exercise in order for weight loss, and to reduce the risk of further co-morbidity. Continue simvastatin  40 mg daily

## 2024-10-26 NOTE — Assessment & Plan Note (Signed)
 Following with [facility] MD.  Continue NP thyroid  75 mg daily.

## 2024-10-26 NOTE — Assessment & Plan Note (Signed)
CBC pending.

## 2024-10-26 NOTE — Assessment & Plan Note (Signed)
 Controlled.  Continue omeprazole 40 mg daily.

## 2024-10-26 NOTE — Assessment & Plan Note (Signed)
 Agreed to increase gabapentin .  Increase gabapentin  to 300 mg twice daily.  Drowsiness precautions provided

## 2024-10-26 NOTE — Progress Notes (Signed)
 Subjective:    Patient ID: Jamie Koch, female    DOB: July 05, 1956, 68 y.o.   MRN: [id number]  Srija Southard is a very pleasant 68 y.o. female with a history of hypothyroidism, type 2 diabetes, hyperlipidemia, iron deficiency anemia, vitamin D  deficiency who presents today for follow-up of chronic conditions.    Immunizations: -Tetanus: Completed in 2022 -Influenza: Completed this season  -Shingles: Completed Shingrix series -Pneumonia: Completed last in 2021  Mammogram: Completed in December 2023, declines today Bone Density Scan: Completed in December 2023  Colonoscopy: Completed in 2024, unclear when due next  1) Type 2 Diabetes:  Current medications include: None. She is managed on gabapentin  300 mg HS for neuropathy/arthritis, would like to increase dose to BID. Overall it is helpful.    Last A1C: 6.2 in November 2025 Last Eye Exam: Due Last Foot Exam: UTD Pneumonia Vaccination: 2021 Urine Microalbumin: UTD Statin: Simvastatin   2) Hyperlipidemia: Currently managed on simvastatin  40 mg daily.  She is due for repeat lipid panel today. She denies chest pain, shortness of breath.   3) Hypothyroidism: Following with [facility] MD and is managed on NP Thyroid  75 mg daily. Last visit was last week, levels were normal.   BP Readings from Last 3 Encounters:  10/26/24 136/78  09/19/24 138/78  09/04/24 130/84      Review of Systems  Constitutional:  Negative for fever.  HENT:  Positive for congestion and postnasal drip.   Respiratory:  Positive for cough. Negative for shortness of breath.   Cardiovascular:  Negative for chest pain.  Gastrointestinal:  Negative for constipation and diarrhea.  Musculoskeletal:  Positive for arthralgias and back pain.         Past Medical History:  Diagnosis Date   Anemia due to acute blood loss 12/23/2022   Arthritis    Chicken pox    Depression    Diverticulosis of intestine with bleeding 12/25/2022   Eye irritation 07/17/2020    Fracture of head of radius 04/14/2016   Gastric ulcer 12/24/2022   GI bleeding 12/22/2022   Hay fever    High cholesterol    Hypothyroidism    Osteoporosis    Osteoporosis   Persistent cough for 3 weeks or longer 08/18/2023   PMB (postmenopausal bleeding) 07/17/2020   PUD (peptic ulcer disease) 03/24/2023   Thyroid  dysfunction     Social History   Socioeconomic History   Marital status: Married    Spouse name: Not on file   Number of children: Not on file   Years of education: Not on file   Highest education level: 12th grade  Occupational History   Not on file  Tobacco Use   Smoking status: Never   Smokeless tobacco: Never  Vaping Use   Vaping status: Never Used  Substance and Sexual Activity   Alcohol use: No   Drug use: No   Sexual activity: Yes  Other Topics Concern   Not on file  Social History Narrative   Married.   1 child.   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    Enjoys reading, gardening.    Social Drivers of Health   Tobacco Use: Low Risk (10/26/2024)   Patient History    Smoking Tobacco Use: Never    Smokeless Tobacco Use: Never    Passive Exposure: Not on file  Financial Resource Strain: Low Risk (09/04/2024)   Overall Financial Resource Strain (CARDIA)    Difficulty of Paying Living Expenses: Not hard at all  Food Insecurity: No  Food Insecurity (09/04/2024)   Epic    Worried About Programme Researcher, Broadcasting/film/video in the Last Year: Never true    Ran Out of Food in the Last Year: Never true  Transportation Needs: No Transportation Needs (09/04/2024)   Epic    Lack of Transportation (Medical): No    Lack of Transportation (Non-Medical): No  Physical Activity: Inactive (09/04/2024)   Exercise Vital Sign    Days of Exercise per Week: 0 days    Minutes of Exercise per Session: Not on file  Stress: No Stress Concern Present (09/04/2024)   [employer] of Occupational Health - Occupational Stress Questionnaire    Feeling of Stress: Only a little   Social Connections: Unknown (09/04/2024)   Social Connection and Isolation Panel    Frequency of Communication with Friends and Family: Once a week    Frequency of Social Gatherings with Friends and Family: Patient declined    Attends Religious Services: More than 4 times per year    Active Member of Clubs or Organizations: Yes    Attends Banker Meetings: More than 4 times per year    Marital Status: Married  Catering Manager Violence: Not At Risk (08/31/2023)   Humiliation, Afraid, Rape, and Kick questionnaire    Fear of Current or Ex-Partner: No    Emotionally Abused: No    Physically Abused: No    Sexually Abused: No  Depression (PHQ2-9): Low Risk (10/26/2024)   Depression (PHQ2-9)    PHQ-2 Score: 2  Alcohol Screen: Low Risk (09/04/2024)   Alcohol Screen    Last Alcohol Screening Score (AUDIT): 1  Housing: Low Risk (09/04/2024)   Epic    Unable to Pay for Housing in the Last Year: No    Number of Times Moved in the Last Year: 0    Homeless in the Last Year: No  Utilities: Not At Risk (08/31/2023)   [facility] Utilities    Threatened with loss of utilities: No  Health Literacy: Adequate Health Literacy (08/31/2023)   B1300 Health Literacy    Frequency of need for help with medical instructions: Never    Past Surgical History:  Procedure Laterality Date   ABDOMINAL HYSTERECTOMY  2023   Complete hysterectomy   COLONOSCOPY WITH PROPOFOL  N/A 12/24/2022   Procedure: COLONOSCOPY WITH PROPOFOL ;  Surgeon: Therisa Bi, MD;  Location: [facility] ENDOSCOPY;  Service: Endoscopy;  Laterality: N/A;   DILATATION & CURETTAGE/HYSTEROSCOPY WITH MYOSURE N/A 07/29/2020   Procedure: FRACTIONAL DILATATION & CURETTAGE/HYSTEROSCOPY WITH MYOSURE RESECTION OF ENDOMETRIAL POLYP;  Surgeon: Schermerhorn, Debby PARAS, MD;  Location: [facility] ORS;  Service: Gynecology;  Laterality: N/A;   ESOPHAGOGASTRODUODENOSCOPY (EGD) WITH PROPOFOL  N/A 12/24/2022   Procedure: ESOPHAGOGASTRODUODENOSCOPY (EGD) WITH  PROPOFOL ;  Surgeon: Therisa Bi, MD;  Location: [facility] ENDOSCOPY;  Service: Endoscopy;  Laterality: N/A;   ESOPHAGOGASTRODUODENOSCOPY (EGD) WITH PROPOFOL  N/A 03/24/2023   Procedure: ESOPHAGOGASTRODUODENOSCOPY (EGD) WITH PROPOFO;  Surgeon: Unk Corinn Skiff, MD;  Location: [facility] ENDOSCOPY;  Service: Gastroenterology;  Laterality: N/A;   EYE SURGERY     Removal of cataracts both eyes   JOINT REPLACEMENT  2011 & 2019   Left knee partial replacement Right knee complete replacement   LAPAROSCOPIC TOTAL HYSTERECTOMY  07/02/2022   PARTIAL KNEE ARTHROPLASTY Left    TONSILLECTOMY AND ADENOIDECTOMY  1964   TOTAL KNEE ARTHROPLASTY Right     History reviewed. No pertinent family history.  Allergies[1]  Medications Ordered Prior to Encounter[2]  BP 136/78   Pulse 89   Temp 98.4 F (36.9 C) (  Oral)   Ht 5' 3.5 (1.613 m)   Wt 212 lb 2 oz (96.2 kg)   SpO2 97%   BMI 36.99 kg/m  Objective:   Physical Exam HENT:     Right Ear: Tympanic membrane and ear canal normal.     Left Ear: Tympanic membrane and ear canal normal.  Cardiovascular:     Rate and Rhythm: Normal rate and regular rhythm.  Pulmonary:     Effort: Pulmonary effort is normal.     Breath sounds: Normal breath sounds.  Abdominal:     General: Bowel sounds are normal.     Palpations: Abdomen is soft.     Tenderness: There is no abdominal tenderness.  Musculoskeletal:     Cervical back: Neck supple.  Skin:    General: Skin is warm and dry.  Neurological:     Mental Status: She is alert and oriented to person, place, and time.  Psychiatric:        Mood and Affect: Mood normal.     Physical Exam        Assessment & Plan:  Hyperlipidemia, unspecified hyperlipidemia type Assessment & Plan: Repeat lipid panel pending.  Work on a healthy diet and regular exercise in order for weight loss, and to reduce the risk of further co-morbidity. Continue simvastatin  40 mg daily  Orders: -     Lipid panel -     Comprehensive  metabolic panel with GFR  Osteoarthritis, unspecified osteoarthritis type, unspecified site Assessment & Plan: Agreed to increase gabapentin .  Increase gabapentin  to 300 mg twice daily.  Drowsiness precautions provided  Orders: -     Gabapentin ; Take 1 capsule (300 mg total) by mouth 2 (two) times daily. For pain  Dispense: 180 capsule; Refill: 3  Estrogen deficiency -     DG Bone Density; Future  Iron deficiency anemia, unspecified iron deficiency anemia type Assessment & Plan: CBC pending  Orders: -     CBC  GERD without esophagitis Assessment & Plan: Controlled.  Continue omeprazole  40 mg daily   Type 2 diabetes mellitus with hyperglycemia, without long-term current use of insulin (HCC) Assessment & Plan: Controlled  Remain off treatment.   Hypothyroidism, unspecified type Assessment & Plan: Following with [facility].  Continue NP thyroid  75 mg daily.     Assessment and Plan Assessment & Plan   Declines mammogram despite recommendations      Comer MARLA Gaskins, NP       [1]  Allergies Allergen Reactions   Covid-19 (Mrna) Vaccine Other (See Comments)    Pfizer covid-19 vaccine. Caused left arm tingling, headaches, burning skin, vaginal bleeding.   Lactose Intolerance (Gi)     Upset stomach    Medroxyprogesterone Itching  [2]  Current Outpatient Medications on File Prior to Visit  Medication Sig Dispense Refill   Acetaminophen  (TYLENOL  ARTHRITIS PAIN PO) Take 2 tablets by mouth.     diphenhydrAMINE  (BENADRYL ) 25 MG tablet Take 25 mg by mouth at bedtime as needed for allergies or sleep.     Ferrous Sulfate Dried ([facility] SLOW RELEASE IRON) 45 MG TBCR Take by mouth.     losartan  (COZAAR ) 25 MG tablet Take 1 tablet (25 mg total) by mouth daily. For kidney protection and blood pressure 90 tablet 0   NP THYROID  15 MG tablet Take 15 mg by mouth daily.     NP THYROID  60 MG tablet Take 60 mg by mouth daily.     omeprazole  (PRILOSEC) 40 MG capsule Take  1 capsule (40 mg total) by mouth daily before breakfast. 90 capsule 3   OVER THE COUNTER MEDICATION Place 2 sprays into both nostrils daily as needed (allergies). Sovereign Silver  nasal spray     PRESCRIPTION MEDICATION Testosterone 100 mg pellets placed under the skin every 3 months     PRESCRIPTION MEDICATION Estradiol 6 mg pellets placed under the skin every 3 months (Patient taking differently: Estradiol 6 mg pellets placed under the skin every 3 months. Pt states taking 10 mg.)     progesterone  (PROMETRIUM ) 100 MG capsule Take 100 mg by mouth every evening.     simvastatin  (ZOCOR ) 40 MG tablet TAKE 1/2 TABLET BY MOUTH DAILY FOR CHOLESTEROL 45 tablet 0   triamcinolone cream (KENALOG) 0.1 % Apply 1 Application topically daily.     Vitamin D -Vitamin K (VITAMIN K2-VITAMIN D3 PO) Take 3 tablets by mouth daily.     No current facility-administered medications on file prior to visit.

## 2024-10-26 NOTE — Assessment & Plan Note (Signed)
Controlled. Remain off treatment.

## 2024-10-27 ENCOUNTER — Ambulatory Visit: Payer: Self-pay | Admitting: Primary Care

## 2024-11-08 ENCOUNTER — Ambulatory Visit (INDEPENDENT_AMBULATORY_CARE_PROVIDER_SITE_OTHER)

## 2024-11-08 VITALS — BP 118/72 | Ht 63.5 in | Wt 213.8 lb

## 2024-11-08 DIAGNOSIS — Z0000 Encounter for general adult medical examination without abnormal findings: Secondary | ICD-10-CM

## 2024-11-08 DIAGNOSIS — Z1231 Encounter for screening mammogram for malignant neoplasm of breast: Secondary | ICD-10-CM

## 2024-11-08 DIAGNOSIS — Z1211 Encounter for screening for malignant neoplasm of colon: Secondary | ICD-10-CM

## 2024-11-08 NOTE — Progress Notes (Signed)
 "  Chief Complaint  Patient presents with   Medicare Wellness     Subjective:   Jamie Koch is a 68 y.o. female who presents for a Medicare Annual Wellness Visit.  Visit info / Clinical Intake: Medicare Wellness Visit Type:: Subsequent Annual Wellness Visit Persons participating in visit and providing information:: patient Medicare Wellness Visit Mode:: In-person (required for WTM) Interpreter Needed?: No Pre-visit prep was completed: yes AWV questionnaire completed by patient prior to visit?: no Living arrangements:: lives with spouse/significant other Patient's Overall Health Status Rating: good Typical amount of pain: some Does pain affect daily life?: (!) yes Are you currently prescribed opioids?: no  Dietary Habits and Nutritional Risks How many meals a day?: 3 Eats fruit and vegetables daily?: yes Most meals are obtained by: preparing own meals; eating out In the last 2 weeks, have you had any of the following?: none Diabetic:: (!) yes Any non-healing wounds?: no How often do you check your BS?: as needed (does not check much) Would you like to be referred to a Nutritionist or for Diabetic Management? : no  Functional Status Activities of Daily Living (to include ambulation/medication): Independent Ambulation: Independent Medication Administration: Independent Home Management (perform basic housework or laundry): Independent Manage your own finances?: (!) no Primary transportation is: driving Concerns about vision?: no *vision screening is required for WTM* Concerns about hearing?: no  Fall Screening Falls in the past year?: 0 Number of falls in past year: 0 Was there an injury with Fall?: 0 Fall Risk Category Calculator: 0 Patient Fall Risk Level: Low Fall Risk  Fall Risk Patient at Risk for Falls Due to: No Fall Risks Fall risk Follow up: Falls evaluation completed; Education provided; Falls prevention discussed  Home and Transportation Safety: All  rugs have non-skid backing?: yes All stairs or steps have railings?: yes Grab bars in the bathtub or shower?: (!) no Have non-skid surface in bathtub or shower?: yes Good home lighting?: yes Regular seat belt use?: yes Hospital stays in the last year:: no  Cognitive Assessment Difficulty concentrating, remembering, or making decisions? : no Will 6CIT or Mini Cog be Completed: yes What year is it?: 0 points What month is it?: 0 points Give patient an address phrase to remember (5 components): [address] [place]  About what time is it?: 0 points Count backwards from 20 to 1: 0 points Say the months of the year in reverse: 0 points Repeat the address phrase from earlier: 0 points 6 CIT Score: 0 points  Advance Directives (For Healthcare) Does Patient Have a Medical Advance Directive?: Yes Does patient want to make changes to medical advance directive?: No - Patient declined Type of Advance Directive: Healthcare Power of [place]; Living will Copy of Healthcare Power of Attorney in Chart?: No - copy requested Copy of Living Will in Chart?: No - copy requested  Reviewed/Updated  Reviewed/Updated: Reviewed All (Medical, Surgical, Family, Medications, Allergies, Care Teams, Patient Goals)    Allergies (verified) Covid-19 (mrna) vaccine, Lactose intolerance (gi), and Medroxyprogesterone   Current Medications (verified) Outpatient Encounter Medications as of 11/08/2024  Medication Sig   Acetaminophen  (TYLENOL  ARTHRITIS PAIN PO) Take 2 tablets by mouth.   diphenhydrAMINE  (BENADRYL ) 25 MG tablet Take 25 mg by mouth at bedtime as needed for allergies or sleep.   Ferrous Sulfate Dried ([facility] SLOW RELEASE IRON) 45 MG TBCR Take by mouth.   gabapentin  (NEURONTIN ) 300 MG capsule Take 1 capsule (300 mg total) by mouth 2 (two) times daily. For pain  losartan  (COZAAR ) 25 MG tablet Take 1 tablet (25 mg total) by mouth daily. For kidney protection and blood pressure   NP THYROID  15 MG  tablet Take 15 mg by mouth daily.   NP THYROID  60 MG tablet Take 60 mg by mouth daily.   omeprazole  (PRILOSEC) 40 MG capsule Take 1 capsule (40 mg total) by mouth daily before breakfast.   OVER THE COUNTER MEDICATION Place 2 sprays into both nostrils daily as needed (allergies). Sovereign Silver  nasal spray   PRESCRIPTION MEDICATION Testosterone 100 mg pellets placed under the skin every 3 months   PRESCRIPTION MEDICATION Estradiol 6 mg pellets placed under the skin every 3 months (Patient taking differently: Estradiol 6 mg pellets placed under the skin every 3 months. Pt states taking 10 mg.)   progesterone  (PROMETRIUM ) 100 MG capsule Take 100 mg by mouth every evening.   simvastatin  (ZOCOR ) 40 MG tablet TAKE 1/2 TABLET BY MOUTH DAILY FOR CHOLESTEROL   triamcinolone cream (KENALOG) 0.1 % Apply 1 Application topically daily.   Vitamin D -Vitamin K (VITAMIN K2-VITAMIN D3 PO) Take 3 tablets by mouth daily.   No facility-administered encounter medications on file as of 11/08/2024.    History: Past Medical History:  Diagnosis Date   Allergy    Tree pollens   Anemia due to acute blood loss 12/23/2022   Arthritis    Chicken pox    Depression    Diverticulosis of intestine with bleeding 12/25/2022   Eye irritation 07/17/2020   Fracture of head of radius 04/14/2016   Gastric ulcer 12/24/2022   GI bleeding 12/22/2022   Hay fever    High cholesterol    Hypothyroidism    Osteoporosis    Osteoporosis   Persistent cough for 3 weeks or longer 08/18/2023   PMB (postmenopausal bleeding) 07/17/2020   PUD (peptic ulcer disease) 03/24/2023   Thyroid  dysfunction    Past Surgical History:  Procedure Laterality Date   ABDOMINAL HYSTERECTOMY  2023   Complete hysterectomy   COLONOSCOPY WITH PROPOFOL  N/A 12/24/2022   Procedure: COLONOSCOPY WITH PROPOFOL ;  Surgeon: Therisa Bi, MD;  Location: [facility] ENDOSCOPY;  Service: Endoscopy;  Laterality: N/A;   DILATATION & CURETTAGE/HYSTEROSCOPY WITH MYOSURE  N/A 07/29/2020   Procedure: FRACTIONAL DILATATION & CURETTAGE/HYSTEROSCOPY WITH MYOSURE RESECTION OF ENDOMETRIAL POLYP;  Surgeon: Schermerhorn, Debby PARAS, MD;  Location: [facility] ORS;  Service: Gynecology;  Laterality: N/A;   ESOPHAGOGASTRODUODENOSCOPY (EGD) WITH PROPOFOL  N/A 12/24/2022   Procedure: ESOPHAGOGASTRODUODENOSCOPY (EGD) WITH PROPOFOL ;  Surgeon: Therisa Bi, MD;  Location: [facility] ENDOSCOPY;  Service: Endoscopy;  Laterality: N/A;   ESOPHAGOGASTRODUODENOSCOPY (EGD) WITH PROPOFOL  N/A 03/24/2023   Procedure: ESOPHAGOGASTRODUODENOSCOPY (EGD) WITH PROPOFO;  Surgeon: Unk Corinn Skiff, MD;  Location: [facility] ENDOSCOPY;  Service: Gastroenterology;  Laterality: N/A;   EYE SURGERY     Removal of cataracts both eyes   JOINT REPLACEMENT  2011 & 2019   Left knee partial replacement Right knee complete replacement   LAPAROSCOPIC TOTAL HYSTERECTOMY  07/02/2022   PARTIAL KNEE ARTHROPLASTY Left    TONSILLECTOMY AND ADENOIDECTOMY  1964   TOTAL KNEE ARTHROPLASTY Right    Family History  Problem Relation Age of Onset   Cancer Daughter    Heart disease Paternal Grandfather    Social History   Occupational History   Not on file  Tobacco Use   Smoking status: Never   Smokeless tobacco: Never  Vaping Use   Vaping status: Never Used  Substance and Sexual Activity   Alcohol use: No   Drug use: No  Sexual activity: Yes   Tobacco Counseling Counseling given: Not Answered  SDOH Screenings   Food Insecurity: No Food Insecurity (11/08/2024)  Housing: Low Risk (11/08/2024)  Transportation Needs: No Transportation Needs (11/08/2024)  Utilities: Not At Risk (11/08/2024)  Alcohol Screen: Low Risk (11/08/2024)  Depression (PHQ2-9): Low Risk (11/08/2024)  Financial Resource Strain: Low Risk (11/08/2024)  Physical Activity: Inactive (11/08/2024)  Social Connections: Moderately Integrated (11/08/2024)  Stress: Stress Concern Present (11/08/2024)  Tobacco Use: Low Risk (11/08/2024)  Health Literacy:  Adequate Health Literacy (11/08/2024)   See flowsheets for full screening details  Depression Screen PHQ 2 & 9 Depression Scale- Over the past 2 weeks, how often have you been bothered by any of the following problems? Little interest or pleasure in doing things: 0 Feeling down, depressed, or hopeless (PHQ Adolescent also includes...irritable): 0 PHQ-2 Total Score: 0 Trouble falling or staying asleep, or sleeping too much: 2 Feeling tired or having little energy: 0 Poor appetite or overeating (PHQ Adolescent also includes...weight loss): 0 Feeling bad about yourself - or that you are a failure or have let yourself or your family down: 0 Trouble concentrating on things, such as reading the newspaper or watching television (PHQ Adolescent also includes...like school work): 0 Moving or speaking so slowly that other people could have noticed. Or the opposite - being so fidgety or restless that you have been moving around a lot more than usual: 0 Thoughts that you would be better off dead, or of hurting yourself in some way: 0 PHQ-9 Total Score: 2 If you checked off any problems, how difficult have these problems made it for you to do your work, take care of things at home, or get along with other people?: Not difficult at all     Goals Addressed             This Visit's Progress    Patient Stated       I want to lose more weight              Objective:    Today's Vitals   11/08/24 1054  BP: 118/72  Weight: 213 lb 12.8 oz (97 kg)  Height: 5' 3.5 (1.613 m)   Body mass index is 37.28 kg/m.  Hearing/Vision screen Vision Screening - Comments:: UTD w/visits to [employer] and Health Maintenance Health Maintenance  Topic Date Due   OPHTHALMOLOGY EXAM  Never done   Fecal DNA (Cologuard)  08/16/2024   Mammogram  10/16/2024   Diabetic kidney evaluation - Urine ACR  01/03/2025   FOOT EXAM  01/03/2025   HEMOGLOBIN A1C  03/19/2025   Pneumococcal Vaccine: 50+  Years (3 of 3 - PCV20 or PCV21) 09/25/2025   Diabetic kidney evaluation - eGFR measurement  10/26/2025   Medicare Annual Wellness (AWV)  11/08/2025   DTaP/Tdap/Td (3 - Td or Tdap) 05/25/2031   Influenza Vaccine  Completed   Bone Density Scan  Completed   Hepatitis C Screening  Completed   Zoster Vaccines- Shingrix  Completed   Meningococcal B Vaccine  Aged Out   Colonoscopy  Discontinued   COVID-19 Vaccine  Discontinued        Assessment/Plan:  This is a routine wellness examination for Jury.  Patient Care Team: Gretta Comer POUR, NP as PCP - General (Internal Medicine) Schermerhorn, Debby PARAS, MD as Referring Physician (Obstetrics and Gynecology) Pa, [facility])  I have personally reviewed and noted the following in the patients chart:   Medical and social  history Use of alcohol, tobacco or illicit drugs  Current medications and supplements including opioid prescriptions. Functional ability and status Nutritional status Physical activity Advanced directives List of other physicians Hospitalizations, surgeries, and ER visits in previous 12 months Vitals Screenings to include cognitive, depression, and falls Referrals and appointments  Orders Placed This Encounter  Procedures   MM 3D SCREENING MAMMOGRAM BILATERAL BREAST    Reason for Exam (SYMPTOM  OR DIAGNOSIS REQUIRED):   screening for breast cancer    Preferred imaging location?:   [facility]   Cologuard   In addition, I have reviewed and discussed with patient certain preventive protocols, quality metrics, and best practice recommendations. A written personalized care plan for preventive services as well as general preventive health recommendations were provided to patient.   Erminio LITTIE Saris, LPN   [id number]   Return in 1 year (on 11/08/2025) for annual wellness.  After Visit Summary: (In Person-Declined) Patient declined AVS at this time.  Nurse Notes: No voiced or noted concerns  at this time Appointment(s) made: (AWV/CPEJan 2027) HM Addressed: Mammogram ordered Cologuard Ordered Eye Exam record requested "

## 2024-11-08 NOTE — Patient Instructions (Addendum)
 Jamie Koch,  Thank you for taking the time for your Medicare Wellness Visit. I appreciate your continued commitment to your health goals. Please review the care plan we discussed, and feel free to reach out if I can assist you further.  Please note that Annual Wellness Visits do not include a physical exam. Some assessments may be limited, especially if the visit was conducted virtually. If needed, we may recommend an in-person follow-up with your provider.  Ongoing Care Seeing your primary care provider every 3 to 6 months helps us  monitor your health and provide consistent, personalized care.   Referrals If a referral was made during today's visit and you haven't received any updates within two weeks, please contact the referred provider directly to check on the status.  An order has been placed for a Cologuard for you. They will mail you the kit with instructions on how to obtain the sample and send it back in to be tested. If you do not received your kit, please call our office and let us  know.    Mammogram: [facility]  [address] [place] [address] [phone]    Recommended Screenings:  Health Maintenance  Topic Date Due   Eye exam for diabetics  Never done   Cologuard (Stool DNA test)  08/16/2024   Breast Cancer Screening  10/16/2024   Yearly kidney health urinalysis for diabetes  01/03/2025   Complete foot exam   01/03/2025   Hemoglobin A1C  03/19/2025   Pneumococcal Vaccine for age over 71 (3 of 3 - PCV20 or PCV21) 09/25/2025   Yearly kidney function blood test for diabetes  10/26/2025   Medicare Annual Wellness Visit  11/08/2025   DTaP/Tdap/Td vaccine (3 - Td or Tdap) 05/25/2031   Flu Shot  Completed   Osteoporosis screening with Bone Density Scan  Completed   Hepatitis C Screening  Completed   Zoster (Shingles) Vaccine  Completed   Meningitis B Vaccine  Aged Out   Colon Cancer Screening  Discontinued    COVID-19 Vaccine  Discontinued       11/08/2024   11:00 AM  Advanced Directives  Does Patient Have a Medical Advance Directive? Yes  Type of Estate Agent of [place];Living will  Does patient want to make changes to medical advance directive? No - Patient declined  Copy of Healthcare Power of Attorney in Chart? No - copy requested    Vision: Annual vision screenings are recommended for early detection of glaucoma, cataracts, and diabetic retinopathy. These exams can also reveal signs of chronic conditions such as diabetes and high blood pressure.  Dental: Annual dental screenings help detect early signs of oral cancer, gum disease, and other conditions linked to overall health, including heart disease and diabetes.

## 2025-11-14 ENCOUNTER — Ambulatory Visit

## 2025-11-14 ENCOUNTER — Encounter: Admitting: Primary Care
